# Patient Record
Sex: Male | Born: 1955 | Race: Black or African American | Hispanic: No | Marital: Married | Smoking: Former smoker
Health system: Southern US, Community
[De-identification: ages and names within clinical notes are randomized; demographics above are authoritative.]

## PROBLEM LIST (undated history)

## (undated) DIAGNOSIS — M1990 Unspecified osteoarthritis, unspecified site: Secondary | ICD-10-CM

## (undated) DIAGNOSIS — H6690 Otitis media, unspecified, unspecified ear: Secondary | ICD-10-CM

## (undated) DIAGNOSIS — N400 Enlarged prostate without lower urinary tract symptoms: Secondary | ICD-10-CM

## (undated) DIAGNOSIS — E785 Hyperlipidemia, unspecified: Secondary | ICD-10-CM

## (undated) DIAGNOSIS — I517 Cardiomegaly: Secondary | ICD-10-CM

## (undated) DIAGNOSIS — I10 Essential (primary) hypertension: Secondary | ICD-10-CM

## (undated) DIAGNOSIS — N419 Inflammatory disease of prostate, unspecified: Secondary | ICD-10-CM

## (undated) HISTORY — PX: OTHER SURGICAL HISTORY: SHX169

## (undated) HISTORY — PX: COLONOSCOPY: SHX174

## (undated) HISTORY — DX: Inflammatory disease of prostate, unspecified: N41.9

## (undated) HISTORY — DX: Unspecified osteoarthritis, unspecified site: M19.90

## (undated) HISTORY — DX: Benign prostatic hyperplasia without lower urinary tract symptoms: N40.0

## (undated) HISTORY — PX: CARDIAC CATHETERIZATION: SHX172

## (undated) HISTORY — PX: HEMORRHOID SURGERY: SHX153

## (undated) HISTORY — DX: Cardiomegaly: I51.7

## (undated) HISTORY — PX: UPPER GASTROINTESTINAL ENDOSCOPY: SHX188

## (undated) HISTORY — DX: Essential (primary) hypertension: I10

## (undated) HISTORY — DX: Hyperlipidemia, unspecified: E78.5

---

## 2004-07-03 ENCOUNTER — Ambulatory Visit: Payer: Self-pay | Admitting: Family Medicine

## 2004-07-08 ENCOUNTER — Encounter (HOSPITAL_COMMUNITY): Admission: RE | Admit: 2004-07-08 | Discharge: 2004-08-07 | Payer: Self-pay | Admitting: Family Medicine

## 2005-09-15 ENCOUNTER — Ambulatory Visit: Payer: Self-pay | Admitting: Family Medicine

## 2005-09-15 ENCOUNTER — Encounter (INDEPENDENT_AMBULATORY_CARE_PROVIDER_SITE_OTHER): Payer: Self-pay | Admitting: *Deleted

## 2005-09-25 ENCOUNTER — Encounter (INDEPENDENT_AMBULATORY_CARE_PROVIDER_SITE_OTHER): Payer: Self-pay | Admitting: Family Medicine

## 2005-09-25 LAB — CONVERTED CEMR LAB: PSA: 0.89 ng/mL

## 2005-09-29 ENCOUNTER — Ambulatory Visit: Payer: Self-pay | Admitting: Family Medicine

## 2006-03-08 ENCOUNTER — Ambulatory Visit: Payer: Self-pay | Admitting: Family Medicine

## 2006-03-10 ENCOUNTER — Encounter (INDEPENDENT_AMBULATORY_CARE_PROVIDER_SITE_OTHER): Payer: Self-pay | Admitting: Family Medicine

## 2006-03-10 LAB — CONVERTED CEMR LAB: Blood Glucose, Fasting: 95 mg/dL

## 2006-04-05 ENCOUNTER — Encounter: Payer: Self-pay | Admitting: Family Medicine

## 2006-04-05 DIAGNOSIS — I499 Cardiac arrhythmia, unspecified: Secondary | ICD-10-CM | POA: Insufficient documentation

## 2006-04-05 DIAGNOSIS — E785 Hyperlipidemia, unspecified: Secondary | ICD-10-CM | POA: Insufficient documentation

## 2006-04-05 DIAGNOSIS — I10 Essential (primary) hypertension: Secondary | ICD-10-CM | POA: Insufficient documentation

## 2006-04-05 DIAGNOSIS — M545 Low back pain, unspecified: Secondary | ICD-10-CM | POA: Insufficient documentation

## 2006-06-17 ENCOUNTER — Telehealth (INDEPENDENT_AMBULATORY_CARE_PROVIDER_SITE_OTHER): Payer: Self-pay | Admitting: Family Medicine

## 2006-06-21 ENCOUNTER — Ambulatory Visit: Payer: Self-pay | Admitting: Family Medicine

## 2006-06-21 ENCOUNTER — Telehealth (INDEPENDENT_AMBULATORY_CARE_PROVIDER_SITE_OTHER): Payer: Self-pay | Admitting: Family Medicine

## 2006-06-25 ENCOUNTER — Ambulatory Visit: Payer: Self-pay | Admitting: Family Medicine

## 2006-06-25 ENCOUNTER — Telehealth (INDEPENDENT_AMBULATORY_CARE_PROVIDER_SITE_OTHER): Payer: Self-pay | Admitting: Family Medicine

## 2006-07-20 ENCOUNTER — Encounter (INDEPENDENT_AMBULATORY_CARE_PROVIDER_SITE_OTHER): Payer: Self-pay | Admitting: Family Medicine

## 2006-09-07 ENCOUNTER — Encounter (INDEPENDENT_AMBULATORY_CARE_PROVIDER_SITE_OTHER): Payer: Self-pay | Admitting: Family Medicine

## 2006-10-22 ENCOUNTER — Telehealth (INDEPENDENT_AMBULATORY_CARE_PROVIDER_SITE_OTHER): Payer: Self-pay | Admitting: Family Medicine

## 2006-10-25 ENCOUNTER — Ambulatory Visit (HOSPITAL_COMMUNITY): Admission: RE | Admit: 2006-10-25 | Discharge: 2006-10-25 | Payer: Self-pay | Admitting: Family Medicine

## 2006-10-25 ENCOUNTER — Ambulatory Visit: Payer: Self-pay | Admitting: Family Medicine

## 2006-10-25 DIAGNOSIS — R109 Unspecified abdominal pain: Secondary | ICD-10-CM | POA: Insufficient documentation

## 2006-10-25 LAB — CONVERTED CEMR LAB
Glucose, Urine, Semiquant: NEGATIVE
Ketones, urine, test strip: NEGATIVE
Protein, U semiquant: NEGATIVE

## 2006-10-26 ENCOUNTER — Encounter (INDEPENDENT_AMBULATORY_CARE_PROVIDER_SITE_OTHER): Payer: Self-pay | Admitting: Family Medicine

## 2006-10-27 LAB — CONVERTED CEMR LAB
RBC / HPF: NONE SEEN (ref <3)
WBC, UA: NONE SEEN cells/hpf (ref <3)

## 2006-10-28 ENCOUNTER — Ambulatory Visit (HOSPITAL_COMMUNITY): Admission: RE | Admit: 2006-10-28 | Discharge: 2006-10-28 | Payer: Self-pay | Admitting: Family Medicine

## 2006-11-01 ENCOUNTER — Encounter (INDEPENDENT_AMBULATORY_CARE_PROVIDER_SITE_OTHER): Payer: Self-pay | Admitting: Family Medicine

## 2007-01-31 ENCOUNTER — Encounter (INDEPENDENT_AMBULATORY_CARE_PROVIDER_SITE_OTHER): Payer: Self-pay | Admitting: Family Medicine

## 2007-03-07 ENCOUNTER — Encounter (INDEPENDENT_AMBULATORY_CARE_PROVIDER_SITE_OTHER): Payer: Self-pay | Admitting: Family Medicine

## 2007-09-09 ENCOUNTER — Ambulatory Visit (HOSPITAL_COMMUNITY): Admission: AD | Admit: 2007-09-09 | Discharge: 2007-09-10 | Payer: Self-pay | Admitting: Cardiology

## 2007-09-09 ENCOUNTER — Ambulatory Visit: Payer: Self-pay | Admitting: Family Medicine

## 2007-09-09 ENCOUNTER — Ambulatory Visit: Payer: Self-pay | Admitting: Cardiology

## 2007-09-09 ENCOUNTER — Encounter: Payer: Self-pay | Admitting: Cardiology

## 2007-09-09 DIAGNOSIS — F17200 Nicotine dependence, unspecified, uncomplicated: Secondary | ICD-10-CM | POA: Insufficient documentation

## 2007-09-10 LAB — CONVERTED CEMR LAB
HDL: 55 mg/dL
Triglycerides: 78 mg/dL

## 2007-09-12 ENCOUNTER — Encounter (INDEPENDENT_AMBULATORY_CARE_PROVIDER_SITE_OTHER): Payer: Self-pay | Admitting: Family Medicine

## 2007-09-14 ENCOUNTER — Telehealth (INDEPENDENT_AMBULATORY_CARE_PROVIDER_SITE_OTHER): Payer: Self-pay | Admitting: Family Medicine

## 2007-09-15 ENCOUNTER — Encounter: Payer: Self-pay | Admitting: Cardiology

## 2007-09-15 ENCOUNTER — Encounter (INDEPENDENT_AMBULATORY_CARE_PROVIDER_SITE_OTHER): Payer: Self-pay | Admitting: Family Medicine

## 2007-09-15 ENCOUNTER — Ambulatory Visit: Payer: Self-pay

## 2007-09-26 ENCOUNTER — Telehealth (INDEPENDENT_AMBULATORY_CARE_PROVIDER_SITE_OTHER): Payer: Self-pay | Admitting: *Deleted

## 2007-09-28 ENCOUNTER — Ambulatory Visit: Payer: Self-pay | Admitting: Family Medicine

## 2007-10-25 ENCOUNTER — Ambulatory Visit: Payer: Self-pay | Admitting: Cardiology

## 2007-10-26 IMAGING — US US RENAL
1 series · 14 of 25 positions shown · non-contrast
Comparison: CT 10/25/2006

CLINICAL DATA: Followup CT, evaluate for left renal cyst

RENAL/URINARY TRACT ULTRASOUND
TECHNIQUE: Complete ultrasound examination of the urinary tract was performed
including evaluation of the kidneys, renal collecting systems, and urinary
bladder.

[Series 1: unknown · 0.30mm/px · 14 of 51 slices shown]
[im 1/51]
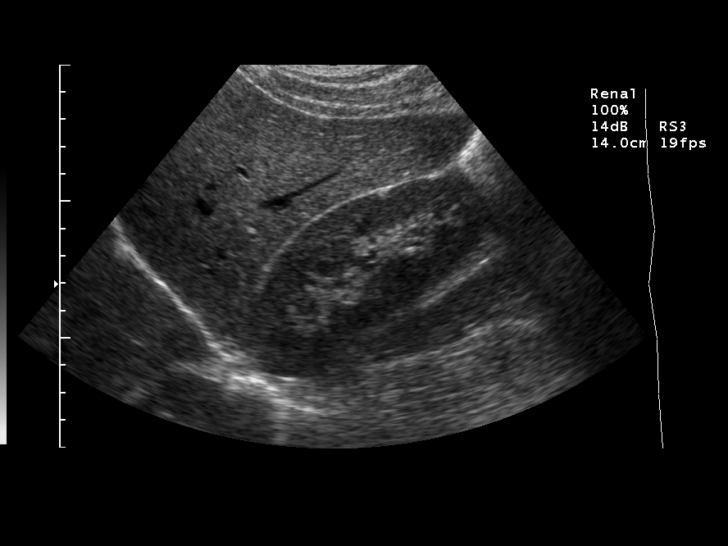
[im 5/51]
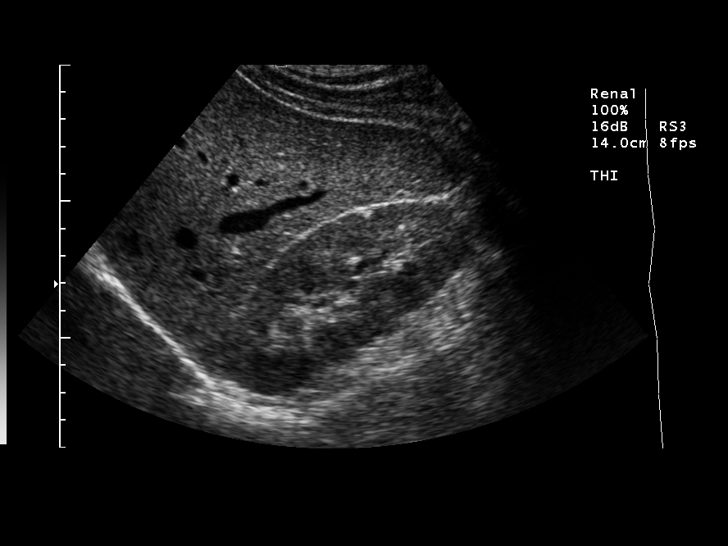
[im 9/51]
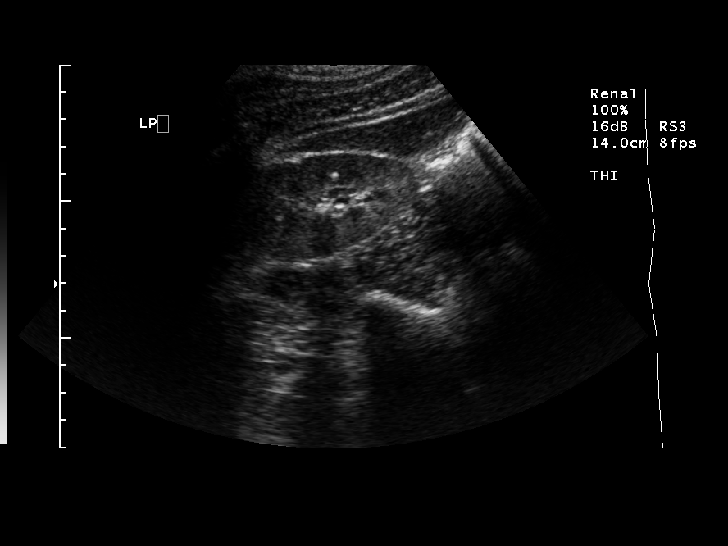
[im 13/51]
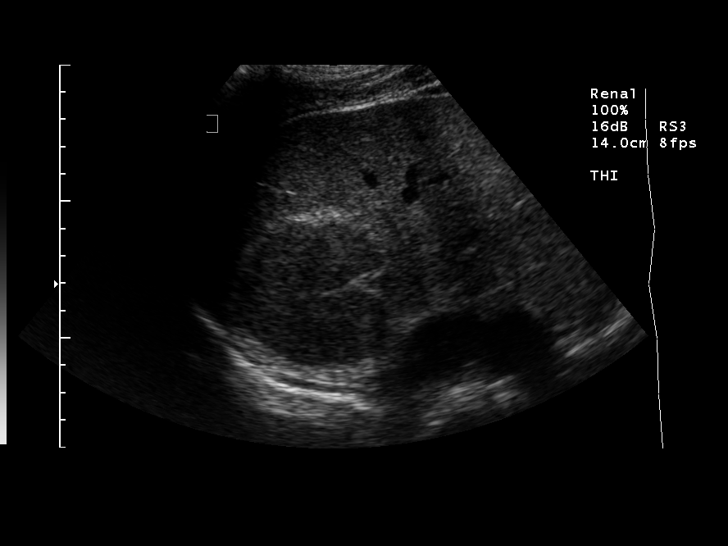
[im 17/51]
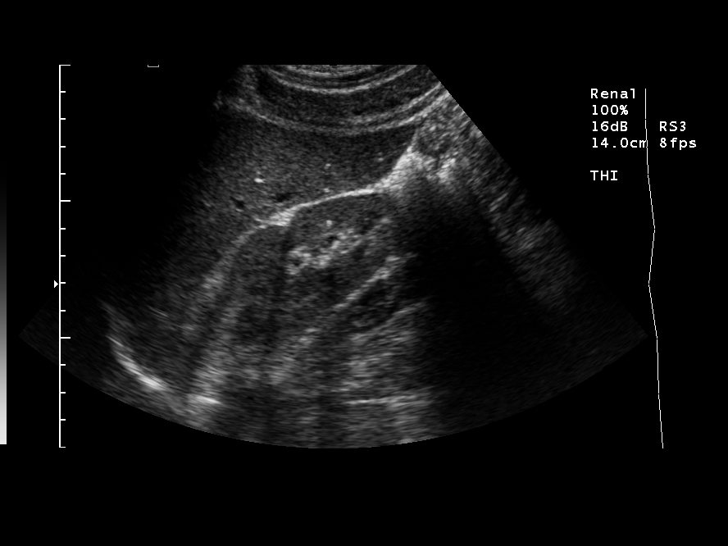
[im 19/51]
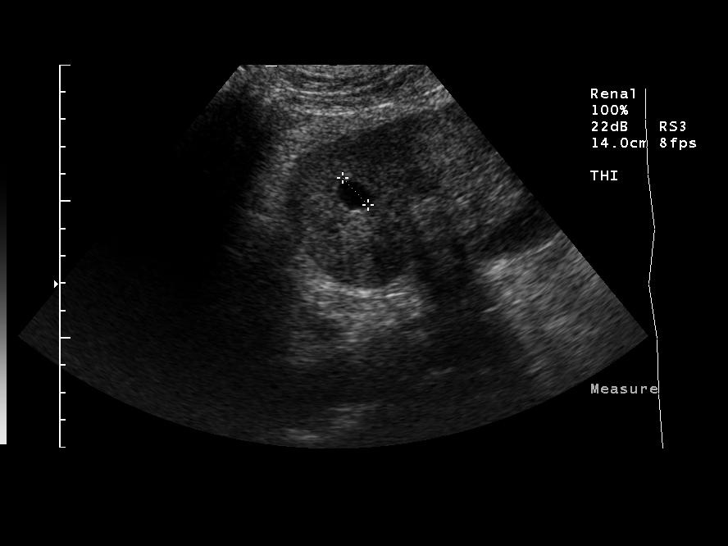
[im 23/51]
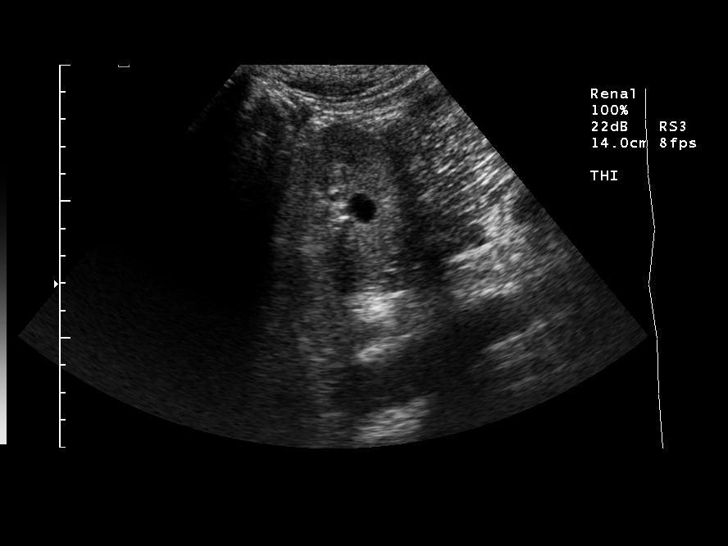
[im 28/51]
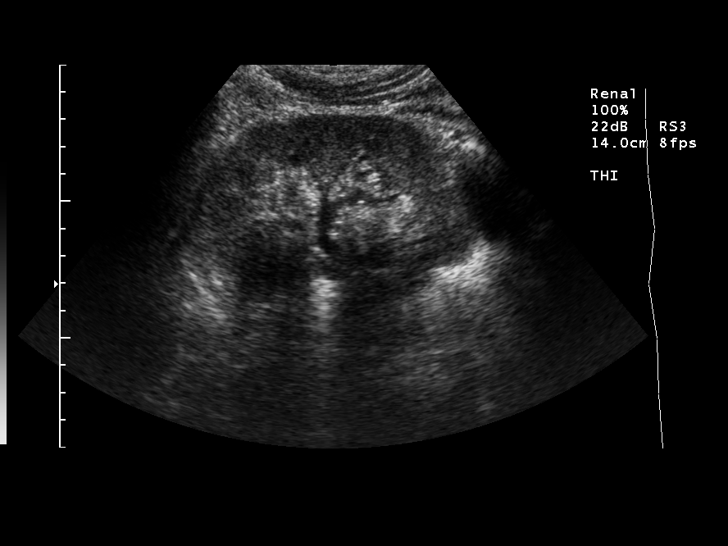
[im 32/51]
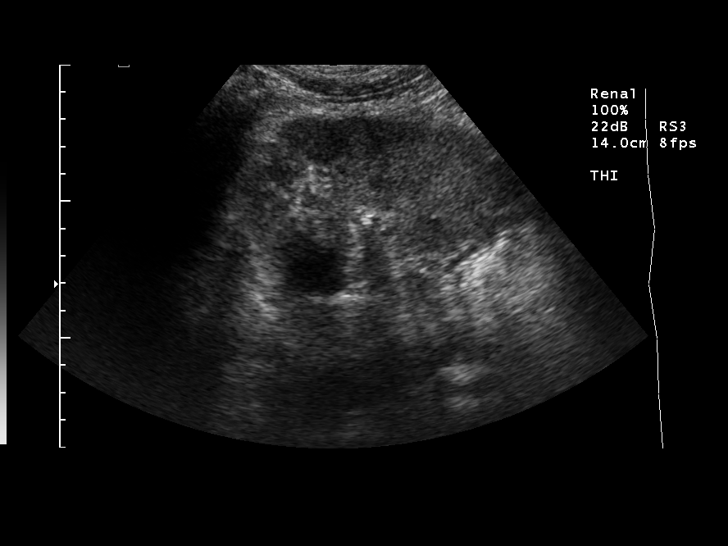
[im 34/51]
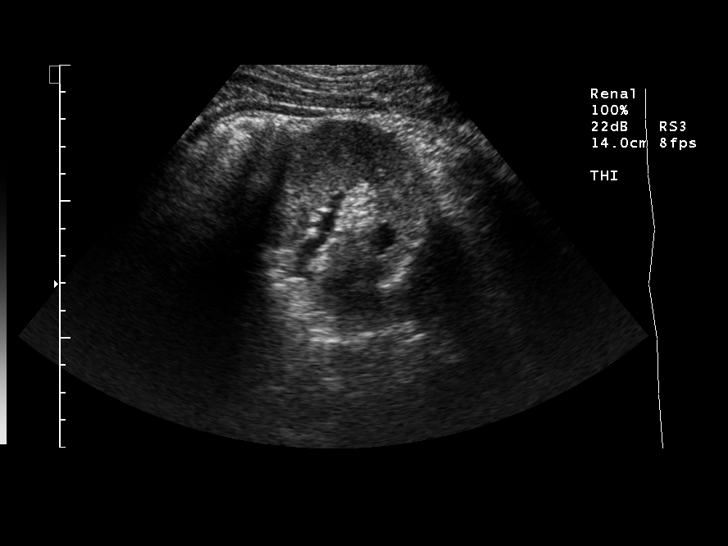
[im 38/51]
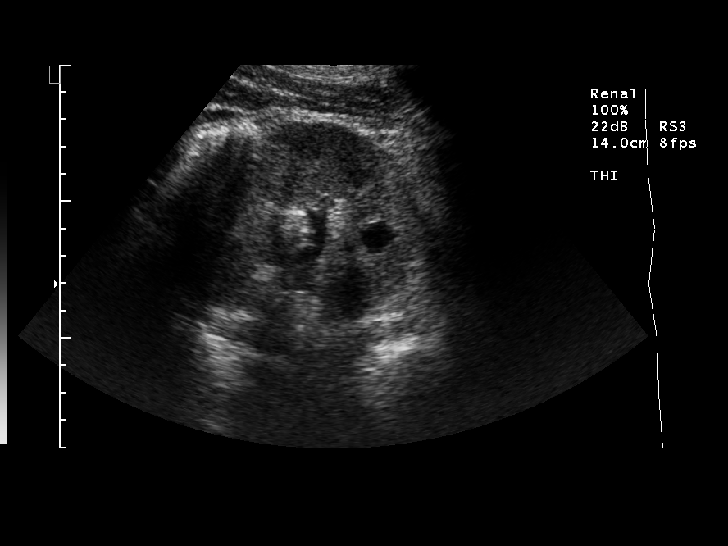
[im 42/51]
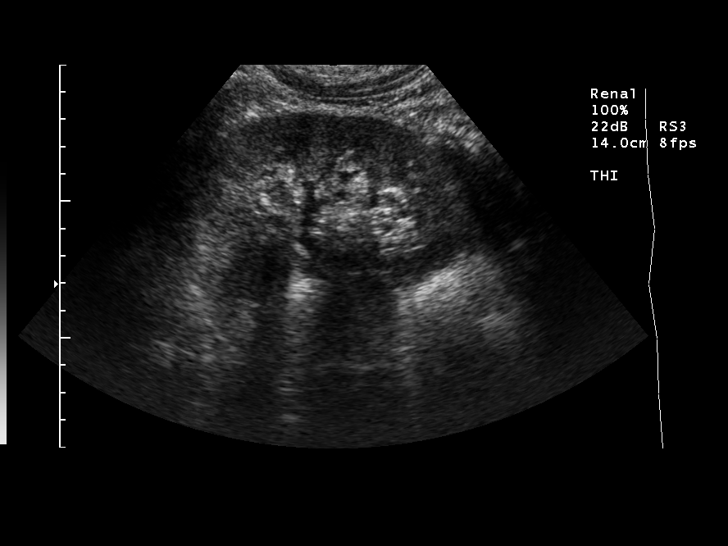
[im 46/51]
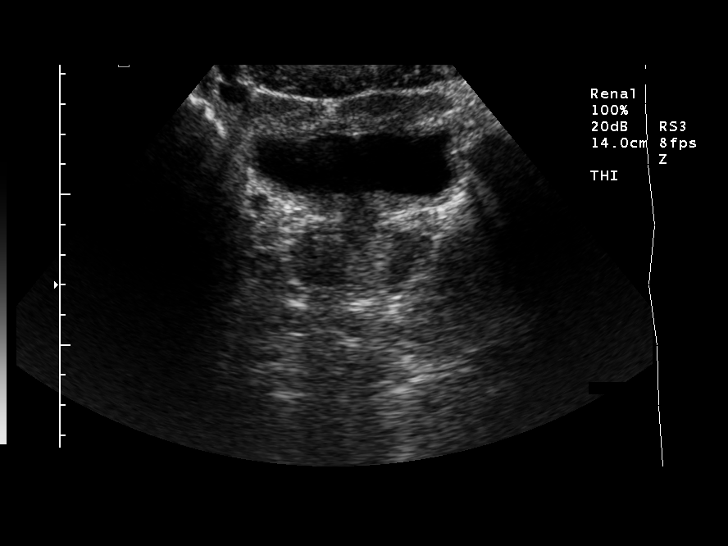
[im 51/51]
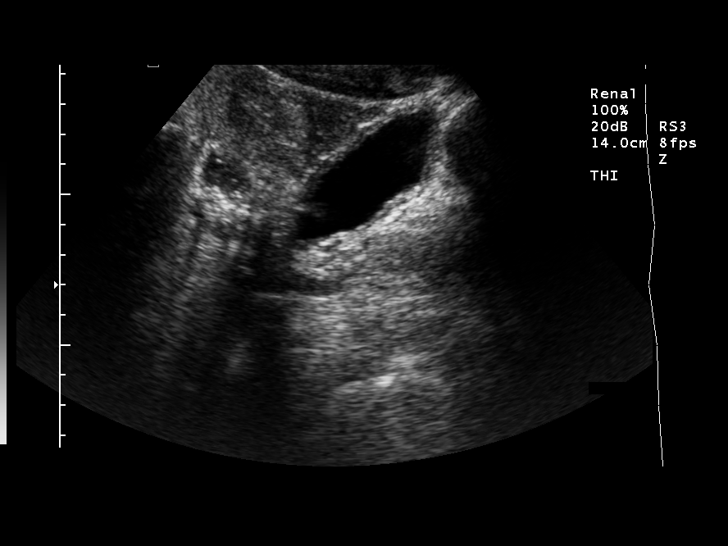

[14 of 25 positions shown; findings below may reference images not displayed]

FINDINGS: Right kidney measures 10.6 cm. No hydronephrosis or mass.

Left kidney measures 10.9 cm. There is a 1.4 x 1.2 x 1.0 cm simple appearing
cyst in the upper pole of the left kidney. No hydronephrosis.

There is a 5 mm hyperechoic area within the collecting system region on the left
with apparent shadowing. This may represent a small stone. However, no stones
were seen on recent CT. 

Bladder is decompressed but otherwise unremarkable.

IMPRESSION

1.4 cm simple appearing cyst in the upper pole of the left kidney.

## 2007-12-30 ENCOUNTER — Ambulatory Visit: Payer: Self-pay | Admitting: Family Medicine

## 2007-12-30 LAB — CONVERTED CEMR LAB
Glucose, Urine, Semiquant: NEGATIVE
Nitrite: NEGATIVE
Specific Gravity, Urine: 1.015
pH: 6.5

## 2007-12-31 ENCOUNTER — Encounter (INDEPENDENT_AMBULATORY_CARE_PROVIDER_SITE_OTHER): Payer: Self-pay | Admitting: Family Medicine

## 2008-01-02 ENCOUNTER — Encounter (INDEPENDENT_AMBULATORY_CARE_PROVIDER_SITE_OTHER): Payer: Self-pay | Admitting: Family Medicine

## 2008-01-02 LAB — CONVERTED CEMR LAB: PSA: 0.78 ng/mL (ref 0.10–4.00)

## 2008-01-06 ENCOUNTER — Ambulatory Visit (HOSPITAL_COMMUNITY): Admission: RE | Admit: 2008-01-06 | Discharge: 2008-01-06 | Payer: Self-pay | Admitting: Family Medicine

## 2008-01-12 ENCOUNTER — Ambulatory Visit: Payer: Self-pay | Admitting: Gastroenterology

## 2008-02-03 ENCOUNTER — Ambulatory Visit (HOSPITAL_COMMUNITY): Admission: RE | Admit: 2008-02-03 | Discharge: 2008-02-03 | Payer: Self-pay | Admitting: Gastroenterology

## 2008-02-03 ENCOUNTER — Ambulatory Visit: Payer: Self-pay | Admitting: Gastroenterology

## 2008-02-07 ENCOUNTER — Ambulatory Visit (HOSPITAL_COMMUNITY): Admission: RE | Admit: 2008-02-07 | Discharge: 2008-02-07 | Payer: Self-pay | Admitting: Gastroenterology

## 2008-06-08 ENCOUNTER — Encounter (INDEPENDENT_AMBULATORY_CARE_PROVIDER_SITE_OTHER): Payer: Self-pay | Admitting: Family Medicine

## 2008-06-08 ENCOUNTER — Ambulatory Visit: Payer: Self-pay | Admitting: Cardiology

## 2008-07-06 ENCOUNTER — Ambulatory Visit: Payer: Self-pay | Admitting: Family Medicine

## 2008-08-31 ENCOUNTER — Ambulatory Visit: Payer: Self-pay | Admitting: Family Medicine

## 2008-09-10 ENCOUNTER — Telehealth (INDEPENDENT_AMBULATORY_CARE_PROVIDER_SITE_OTHER): Payer: Self-pay | Admitting: *Deleted

## 2008-09-10 ENCOUNTER — Encounter (INDEPENDENT_AMBULATORY_CARE_PROVIDER_SITE_OTHER): Payer: Self-pay | Admitting: Family Medicine

## 2008-09-13 ENCOUNTER — Encounter (HOSPITAL_COMMUNITY): Admission: RE | Admit: 2008-09-13 | Discharge: 2008-10-16 | Payer: Self-pay | Admitting: Family Medicine

## 2008-09-19 ENCOUNTER — Ambulatory Visit (HOSPITAL_COMMUNITY): Admission: RE | Admit: 2008-09-19 | Discharge: 2008-09-19 | Payer: Self-pay | Admitting: Family Medicine

## 2008-09-21 ENCOUNTER — Ambulatory Visit: Payer: Self-pay | Admitting: Family Medicine

## 2008-10-10 ENCOUNTER — Telehealth (INDEPENDENT_AMBULATORY_CARE_PROVIDER_SITE_OTHER): Payer: Self-pay | Admitting: *Deleted

## 2008-11-15 ENCOUNTER — Encounter (INDEPENDENT_AMBULATORY_CARE_PROVIDER_SITE_OTHER): Payer: Self-pay | Admitting: Family Medicine

## 2008-11-15 ENCOUNTER — Ambulatory Visit: Admission: RE | Admit: 2008-11-15 | Discharge: 2008-11-15 | Payer: Self-pay | Admitting: Family Medicine

## 2008-11-19 ENCOUNTER — Ambulatory Visit: Payer: Self-pay | Admitting: Family Medicine

## 2008-11-19 DIAGNOSIS — H9190 Unspecified hearing loss, unspecified ear: Secondary | ICD-10-CM | POA: Insufficient documentation

## 2008-12-17 ENCOUNTER — Telehealth (INDEPENDENT_AMBULATORY_CARE_PROVIDER_SITE_OTHER): Payer: Self-pay | Admitting: *Deleted

## 2008-12-19 ENCOUNTER — Telehealth (INDEPENDENT_AMBULATORY_CARE_PROVIDER_SITE_OTHER): Payer: Self-pay | Admitting: *Deleted

## 2008-12-20 ENCOUNTER — Encounter (INDEPENDENT_AMBULATORY_CARE_PROVIDER_SITE_OTHER): Payer: Self-pay | Admitting: Family Medicine

## 2010-06-08 LAB — CONVERTED CEMR LAB
Nitrite: NEGATIVE
Protein, U semiquant: NEGATIVE
Specific Gravity, Urine: 1.025
Urobilinogen, UA: 0.2

## 2010-09-23 NOTE — Consult Note (Signed)
Kyle Rivers, Kyle Rivers               ACCOUNT NO.:  [id number]   MEDICAL RECORD NO.:  [id number]          PATIENT TYPE:  AMB   LOCATION:  DAY                           FACILITY:  [facility]   PHYSICIAN:  Kassie Mends, M.D.      DATE OF BIRTH:  February 09, 1956   DATE OF CONSULTATION:  01/12/2008  DATE OF DISCHARGE:                                 CONSULTATION   REASON FOR CONSULTATION:  Abdominal pain and need for colonoscopy.   HISTORY OF PRESENT ILLNESS:  Kyle Rivers is a 55 year old male who has had  epigastric pain off and on for years.  His symptoms became worse for the  last 4-5 months.  He tried Protonix, and it did not help.  He is now  taking over-the-counter Prilosec, and he feels better.  He describes a  discomfort in the middle of his abdomen with a fullness.  It hurts  especially after eating.  He has not had any weight loss.  It has always  been in the same place and it does not radiate.  He denies any nausea or  vomiting.  He rarely gets constipation.  He has had no diarrhea, blood  in his stool, or black tarry stools.  He does use aspirin on a daily  basis.  He does not use BC powders, Goody Powders, Aleve, or ibuprofen,  Motrin.  He has never had any problems with sedation.  He has a bowel  movement daily.   PAST MEDICAL HISTORY:  1. Hypertension.  2. Hyperlipidemia.   PAST SURGICAL HISTORY:  Hemorrhoidectomy secondary to swollen and  infected hemorrhoids.   ALLERGIES:  No known drug allergies.   MEDICATIONS:  1. Prilosec daily.  2. Aspirin 81 mg daily.  3. Omega-3 daily.  4. Caduet daily.   FAMILY HISTORY:  He denies family history of colon cancer or colon  polyp.   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  He smokes 1  pack every 2 days.  He was a heavy smoker in the past.  He denies any  alcohol use.   REVIEW OF SYSTEMS:  As per the HPI, otherwise all systems are negative.   PHYSICAL EXAMINATION:  GENERAL:  Weight 135 pounds and height 5 feet 7,  temperature 98, blood pressure 140/92, and pulse 72.  GENERAL:  He is no apparent distress.  Alert and oriented x4.  HEENT:  Atraumatic and normocephalic.  Pupils are equal, reactive to  light, posterior pharynx without exudate.  NECK:  Full range of motion.  No lymphadenopathy.  LUNGS:  Clear to auscultation bilaterally.  CARDIOVASCULAR:  Regular rate and rhythm.  Normal S1 and S2.  ABDOMEN:  Bowel sounds are present, soft, nondistended, and mild  tenderness to palpation.  The epigastrium without rebound or guarding.  EXTREMITIES:  No cyanosis or edema.  NEURO:  No new focal neurologic deficits.   ASSESSMENT:  Kyle Rivers is a 55 year old male who has new onset of  dyspepsia.  He also is average risk for developing colon cancer.  The  differential diagnosis for dyspepsia include gastroesophageal  reflux  disease, H. pylori gastritis and a low likelihood of colorectal cancer  with malignancy.  The differential diagnosis also include pancreatic  cancer with a history of smoking. Thank you for allowing me to see Mr.  Rivers in consultation. My recommendations follow.   RECOMMENDATION:  1. Continue Prilosec.  2. He will be scheduled for colonoscopy followed by an EGD with a      Halflytely Bowel Prep.  3. If his endoscopy does not reveal an etiology for epigastric      discomfort, then he will need a CT scan of the abdomen with IV      contrast evaluation of pancreatic cancer.  4. Kyle Rivers will be scheduled a follow up appointment based on the      findings of endoscopy.      Kassie Mends, M.D.  Electronically Signed     SM/MEDQ  D:  01/12/2008  T:  01/13/2008  Job:  [id number]   cc:   Franchot Heidelberg, M.D.

## 2010-09-23 NOTE — Discharge Summary (Signed)
NAMEZAION, HREHA NO.:  [id number]   MEDICAL RECORD NO.:  [id number]          PATIENT TYPE:  OIB   LOCATION:  2920                         FACILITY:  [facility]   PHYSICIAN:  Bevelyn Buckles. Bensimhon, MDDATE OF BIRTH:  [birth year]   DATE OF ADMISSION:  09/09/2007  DATE OF DISCHARGE:  09/10/2007                         DISCHARGE SUMMARY - REFERRING   DISCHARGE DIAGNOSES:  1. Abdominal and chest discomfort of uncertain etiology.  2. No evidence of coronary artery disease on cardiac catheterization.  3. Abnormal electrocardiogram, probable left ventricular hypertrophy      variant.  4. Hypertension.  5. Tobacco use.  6. History of hyperlipidemia.   PROCEDURES PERFORMED:  Cardiac catheterization on Sep 09, 2007, by Dr.  Charlies Constable.   DATE OF ADMISSION:  Sep 09, 2007, Dr. Juanda Chance.   DATE OF DISCHARGE:  Sep 13, 2007, Dr. Gala Romney.   SUMMARY OF HISTORY:  Mr. Flaum is a 55 year old male who approximately 1-  week ago began to experience a constant upper abdominal discomfort that  he attributed to indigestion, worse with meals.  He has also noticed  some discomfort in his chest, but this has not limited his exercise  regimen.  He went to see his primary care physician and his EKG showed  anterior ST-segment elevation, thus he was referred to the hospital and  code STEMI was initiated.   PAST MEDICAL HISTORY:  Notable for hypertension, hyperlipidemia and  tobacco use.   LABORATORY:  Chest x-ray on Sep 09, 2007, showed no acute findings.  Admission hemoglobin and hematocrit was 14.9 and 42.7, normal indices,  platelets 150, WBCs 7.5, PTT 122, PT 14.1.  Sodium 141, potassium 4.4.  BUN 6, creatinine 1.14, glucose 97.  Normal LFTs.  CK-MBs, relative  indexes and troponins were within normal limits x2.  Total cholesterol  was 170, triglycerides 78, HDL 55, LDL 99.  TSH was 2.083.  EKGs here  and in primary care physician showed normal sinus rhythm, normal axis,  LVH,  slight early R-wave progression.  He had early upsloping ST  segments with diffuse slight J point elevation.  However, in leads V1-  V4, he does have ST-segment elevation with biphasic T-waves toward the  lateral leads.   HOSPITAL COURSE:  Mr. Gilman was initiated as a code STEMI and he was  taken emergently to the catheterization lab on Sep 09, 2007, by Dr.  Charlies Constable.  Catheterization site did not show any evidence of  coronary disease.  EF was 60%.  Dr. Juanda Chance felt that his abnormal EKG  was probably secondary to hypertension and LVH, although his EKG is  somewhat atypical.  Enzymes were cycled and he did rule out for a  myocardial infarction.  Dr. Juanda Chance felt that he should be treated for  reflux.  By Sep 10, 2007, the catheterization site was doing well.  Enzymes did rule out myocardial infarction.  Dr. Gala Romney felt that the  patient could be discharged home.  He is recommended to carry a copy of  this abnormal EKG with him.   DISPOSITION:  Mr. Cedrone was discharged home.  Given  permission to return  to work on Sep 13, 2007.  Asked to maintain a low-sodium, heart-healthy  diet.  Wound care and activities per supplemental sheet  postcatheterization.  He received a new prescription for Protonix 40 mg  daily.  Asked to continue his aspirin 81 mg daily, Caduet 10/10 daily  and fish oil daily.  He was asked to begin a blood pressure diary.  Provided an EKG to carry in his wallet.  He was advised no smoking or  tobacco products.  The office will call him with arrangements for an  echocardiogram and follow-up appointment with Dr. Juanda Chance.  He was also  asked to arrange a follow-up appointment with his primary care physician  and to bring all medications, as well as his blood pressure diary to all  appointments.  Discharge time 35 minutes.      Joellyn Rued, PA-C      Bevelyn Buckles. Bensimhon, MD  Electronically Signed    EW/MEDQ  D:  09/10/2007  T:  09/10/2007  Job:  [id number]    cc:   Everardo Beals. Juanda Chance, MD, [facility]  Franchot Heidelberg, M.D.

## 2010-09-23 NOTE — Cardiovascular Report (Signed)
Kyle Rivers, Kyle Rivers               ACCOUNT NO.:  [id number]   MEDICAL RECORD NO.:  [id number]          PATIENT TYPE:  OIB   LOCATION:  2920                         FACILITY:  [facility]   PHYSICIAN:  Bruce R. 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  He has a history of  hypertension and hyperlipidemia, but no known history of heart disease.  He has had chest pain over the past week and saw Dr. Erby Pian today in  his office where his ECG showed anterolateral ST elevation with a  lateral T-wave inversion.  He was transported by ambulance to [facility]  and then by [facility] to [facility] after code STEMI was called.  He  was having minimal pain when he arrived here.   PROCEDURE:  The procedure was performed with a right femoral arterial  sheath and 6-French pyriform coronary catheters.  A front wall arterial  puncture was performed and Omnipaque contrast was used.  The patient  tolerated the procedure well and left the laboratory in satisfactory  condition.   RESULTS:  The aortic pressure was 118/78 with mean of 96 and left  ventricular pressure was 118/14.   The left main coronary artery:  The main coronary artery is free of  significant disease.   The left anterior descending artery, please note that the left  descending artery gave rise to 3 diagonal branches and a large septal  perforator.  The LAD was very large in caliber with mild irregularities  but no significant obstruction.   Circumflex artery:  The circumflex artery was a moderate-sized vessel  gave rise to 2 marginal branches and 2 posterolateral branches.  There  was some irregularities, but no significant obstruction.   The right coronary artery:  The right coronary artery was a moderate-  sized  vessel that gave rise to conus branch, 2 right ventricular  branches, posterior ascending branch, and posterolateral branch.  This  vessel was free of significant disease.   The left ventricle:  The left ventriculogram performed in ROA projection  showed good wall motion with no areas of hypokinesis.  The estimated  ejection fraction was 6%.   CONCLUSION:  Normal coronary angiography and left ventricular wall  motion.   RECOMMENDATIONS:  In view of these findings, I suspect the abnormal ECG  is related to LVH, although the pattern is a little atypical.  We will  plan to cycle enzymes and if these are negative, I think, he can be  discharged tomorrow.  We will plan an outpatient echocardiogram to  evaluate him for LVH or hypertrophic cardiomyopathy.      Bruce Elvera Lennox Juanda Chance, MD, [facility]  Electronically Signed     BRB/MEDQ  D:  09/09/2007  T:  09/10/2007  Job:  [id number]   cc:   Franchot Heidelberg, M.D.

## 2010-09-23 NOTE — Op Note (Signed)
NAMEELAINE, MIDDLETON               ACCOUNT NO.:  [id number]   MEDICAL RECORD NO.:  [id number]          PATIENT TYPE:  AMB   LOCATION:  DAY                           FACILITY:  [facility]   PHYSICIAN:  Kassie Mends, M.D.      DATE OF BIRTH:  Sep 26, 1955   DATE OF PROCEDURE:  DATE OF DISCHARGE:                               OPERATIVE REPORT   REFERRING PHYSICIAN:  Franchot Heidelberg, M.D.   PROCEDURE:  1. Colonoscopy.  2. Esophagogastroduodenoscopy.   INDICATION FOR EXAM:  Mr. Laforte in a 55 year old male who complains of  abdominal pain.  He has a history of tobacco abuse.  The pain is located  in the middle of his abdomen associated with the fullness, especially  hurts after he eats.  It is in the same place and does not radiate. He  is at average risk for developing colon cancer.   FINDINGS:  1. Tortuous colon.  Rare sigmoid colon diverticula and rare sigmoid      colon diverticula.  Otherwise no polyps, masses, inflammatory      changes, or AVMs seen.  2. Patent Schatzki's ring.  Otherwise normal esophagus without      evidence of Barrett's mass or erosion or ulceration.  3. Normal stomach, duodenal bulb, and second portion of the duodenum.   DIAGNOSIS:  No source for Mr. Schuld abdominal pain identified.   RECOMMENDATIONS:  1. He should have a CT scan of the abdomen and pelvis with and without      IV contrast and with oral contrast.  2. He should continue the Prilosec daily.  3. Screening colonoscopy in 10 years.  4. He should follow a high-fiber diet.  He was given handout on high-      fiber diet and diverticulosis.  5. Follow up appointment in 2 months with Dr. Cira Servant regarding his      abdominal pain.   MEDICATIONS:  1. Demerol 75 mg IV.  2. Versed 5 mg IV.   PROCEDURE TECHNIQUE:  Physical exam was formed.  Informed consent was  obtained from the patient after explaining the benefits, risks, and  alternatives of the procedure.  The patient was connected to the monitor  and placed in left lateral position.  Continuous oxygen was provided by  nasal cannula and IV medicine administered through an indwelling  cannula.  After administration of sedation and rectal exam, the  patient's rectum was intubated and the scope was advanced into the  cecum.  The scope was withdrawn by slowly carefully examining the color,  texture, anatomy, and integrity of the mucosa on the way out.   After the colonoscopy, the patient's esophagus was intubated with a  diagnostic gastroscope and the scope was advanced under direct  visualization to the second portion of the duodenum.  The scope was  removed slowly by carefully examine the color, texture, anatomy  integrity mucosa on the way out.  The patient was recovered in endoscopy  and discharged home in satisfactory condition.   ADDENDUM:  CT SCAN 04540 w/ & w/o contrast: small renal cysts. Consider  HBT for  lactose or fructose intolerance.      Kassie Mends, M.D.  Electronically Signed     SM/MEDQ  D:  02/03/2008  T:  02/03/2008  Job:  [id number]   cc:   Franchot Heidelberg, M.D.

## 2010-09-23 NOTE — Assessment & Plan Note (Signed)
[facility]                            CARDIOLOGY OFFICE NOTE   NAME:COLESTanyon, Alipio                      MRN:          [id number]  DATE:10/25/2007                            DOB:          February 18, 1956    PRIMARY CARE PHYSICIAN:  Franchot Heidelberg, M.D.   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  We had an emergency  catheterization, which was normal.  In retrospect, we thought his EKG  changes are probably related to LVH and his symptoms were probably  related to acid reflux.  He was discharged on Protonix and his symptoms  have pretty much resolved.  He is back in to his normal routines.  He  subsequently had an echocardiogram, which showed borderline LV and  septal wall thickness and normal LV function.   PAST MEDICAL HISTORY:  Significant for hypertension, hyperlipidemia, and  tobacco use.   CURRENT MEDICATIONS:  1. Caduet 10/10 daily.  2. Aspirin.  3. Fish oil.  4. Protonix 40 mg daily.   PHYSICAL EXAMINATION:  VITAL SIGNS:  Today, the blood pressure was  132/92 and the pulse 72 and regular.  NECK:  There was no venous distention.  The carotid pulses were full  without bruits.  CHEST:  Clear.  HEART:  Rhythm was regular.  No murmurs or gallops.  ABDOMEN:  Soft without organomegaly.  EXTREMITIES:  Peripheral pulses were full and no peripheral edema.   His electrocardiogram showed LVH and anterolateral ST elevation with  lateral T-wave inversions.   IMPRESSION:  1. Abnormal electrocardiogram, probably related to an unusual variant      of left ventricular hypertrophy.  2. Normal coronary angiography.  3. Hypertension.  4. Hyperlipidemia.  5. Tobacco use.   RECOMMENDATIONS:  Mr. Vitolo is stable and I think his recent symptoms  have pretty much resolved with treatment with Protonix.   He does have  significantly abnormal ECG and he now has a copy to carry with him,  which probably represents an unusual variant of LVH.  I stressed the  importance of continued control of blood pressure and cholesterol and  urged him to continue to work on stopping cigarettes.  We will plan to  turn him over to Dr. Erby Pian and we will plan to see him back here on a  p.r.n. basis.  I told him he could stop the Protonix when he finishes  his current supply.     Bruce Elvera Lennox Juanda Chance, MD, [facility]  Electronically Signed    BRB/MedQ  DD: 10/25/2007  DT: 10/26/2007  Job #: [id number]   cc:   Franchot Heidelberg, M.D.

## 2010-09-23 NOTE — H&P (Signed)
Kyle Rivers, Kyle Rivers               ACCOUNT NO.:  [id number]   MEDICAL RECORD NO.:  [id number]           PATIENT TYPE:   LOCATION:                                 FACILITY:   PHYSICIAN:  Bruce R. Juanda Chance, MD, FACCDATE OF BIRTH:  06-Mar-1956   DATE OF ADMISSION:  09/09/2007  DATE OF DISCHARGE:                              HISTORY & PHYSICAL   PRIMARY CARE Kyle Rivers:  Dr. Erby Pian in [place].   THE PATIENT PROFILE:  A 55 year old married African American male  without prior CAD history, who presents with chest pain and ST-segment  elevation.  Problems:  1. ? ST elevation MI or anterior ST-segment elevation MI.  2. Hypertension.  3. Hyperlipidemia.  4. Ongoing tobacco abuse, 20-30 pack year history, currently smoking      half pack a day.  5. Status post hemorrhoidectomy approximately 6 months ago.   HISTORY OF PRESENT ILLNESS:  A 55 year old married African American male  with history of hypertension, hyperlipidemia, and tobacco abuse.  He is  very active and exercise almost daily.  He was in his usual state of  health.  Approximately 1 week ago, when he began to experience constant  upper abdominal discomfort and indigestion worse with meals and no  change with exertion.  There were no associated symptoms.  Despite this  constant discomfort, he was able to perform his regular exercise routine  on a regular basis without any worsening of symptoms.  Over the past  week, the discomfort moved into his chest, and he became more concerned  and his wife scheduled him an appointment with Dr. Erby Pian.  He saw Dr.  Erby Pian today and an ECG was performed showing anterior ST-segment  elevation.  The patient was transferred to [facility] on April,  16, 2009 and a code STEMI was activated.  The patient arrived at [facility] at 1810, still complaining of a mild chest pain.  ECG, at  [facility] showed 1-mm ST elevation at leads II and aVF, 2-3 mm ST  elevation V1-V6.   ALLERGIES:  Pain tablet cause rash.   HOME MEDICATIONS:  1. Caduet 10/10 mg daily.  2. Aspirin 81 mg daily.  3. Omega-3 acid daily.   FAMILY HISTORY:  Mother is alive at age 29 with a history of CVA.  Father died in motor vehicle accident at age 32, and there was some  questions that when he had MI at that time.  He has 1 brother and 5  sisters, all of them have hypertension.   SOCIAL HISTORY:  He lives in [place] with his wife.  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  He has a 20-30 pack year history tobacco abuse, currently  smoking half-a-pack a day.  He, occasionally, will have a beer, but says  he drinks very little.  Denies any drug use.  He exercises almost on a  very regular basis 15-30 minutes.   REVIEW OF SYSTEMS:  Positive chest pain and indigestion.  Otherwise, all  systems reviewed and negative.   PHYSICAL EXAMINATION:  VITAL SIGNS:  He is afebrile, heart rate 70,  respirations 18, blood pressure 117/77, and pulse ox 97% on 2 liters.  GENERAL:  Pleasant African American male in no acute distress.  Awake,  alert, and oriented x3.  HEENT:  Normal.  Nares grossly intact, nonfocal.  SKIN:  Warm and dry without lesions or masses.  NECK:  No bruits.  No JVD.  LUNGS:  Respirations regular and labored.  CARDIAC:  Regular S1 and S2.  No S3 and S4.  No murmurs.  ABDOMEN:  Round, soft, nontender, and nondistended.  Bowel sounds are  present.  EXTREMITIES:  Warm and dry.  No clubbing, cyanosis, or edema.  Dorsalis  pedis and posterior tibial pulses 2+ and equal bilaterally.   A chest x-ray is pending.  EKG shows sinus rhythm with rate of 65 and  normal axis.  A 1-mm ST elevation II aVF, 2-3 mm ST elevation V1-V6.  Hemoglobin 14.9, hematocrit 42.7, WBC 7.5, platelets 150.  CK-MB less  1.0, troponin-I less than 0.5.   ASSESSMENT AND PLAN:  1. Chest pain.  This was initially called STEMI.  However,       catheterization was performed and this was showed nonobstructive      disease normal left ventricular function.  We will continue      aspirin, statin, and cycle cardiac markers.  Also, check with      lipids and LFTs.  We will add Protonix and will try      gastrointestinal cocktail tonight to see if it helps him any with      his markedly abnormal ECG, or we will need to make a copy of the      patient's ECG, so that he can carry this as well.  2. Hypertension, stable.  Continue calcium channel blocker.  3. Hyperlipidemia.  Check lipids and LFTs, continue Lipitor.  4. Tobacco abuse.  The patient is strongly advised and will obtain a      tobacco cessation consult.      Nicolasa Ducking, ANP      Bruce R. Juanda Chance, MD, [facility]  Electronically Signed    CB/MEDQ  D:  09/09/2007  T:  09/10/2007  Job:  [id number]

## 2011-02-09 LAB — POCT I-STAT, CHEM 8
BUN: 7
Calcium, Ion: 1.1 — ABNORMAL LOW
TCO2: 24

## 2011-05-20 ENCOUNTER — Encounter: Payer: Self-pay | Admitting: Family Medicine

## 2011-05-20 ENCOUNTER — Ambulatory Visit (INDEPENDENT_AMBULATORY_CARE_PROVIDER_SITE_OTHER): Payer: Self-pay | Admitting: Family Medicine

## 2011-05-20 VITALS — BP 126/80 | HR 77 | Resp 16 | Ht 67.0 in | Wt 141.1 lb

## 2011-05-20 DIAGNOSIS — I499 Cardiac arrhythmia, unspecified: Secondary | ICD-10-CM

## 2011-05-20 DIAGNOSIS — E785 Hyperlipidemia, unspecified: Secondary | ICD-10-CM

## 2011-05-20 DIAGNOSIS — M545 Low back pain, unspecified: Secondary | ICD-10-CM

## 2011-05-20 DIAGNOSIS — I10 Essential (primary) hypertension: Secondary | ICD-10-CM

## 2011-05-20 NOTE — Progress Notes (Signed)
  Subjective:    Patient ID: Kyle Rivers, male    DOB: 1955/12/09, 55 y.o.   MRN: [id number]  HPI   Previous PCP Dr. Margo Aye - patient here to establish care  Chronic back pain- use anti-inflammatories when needed, has flares every now and then       Had imaging done at  [facility]- will have results this week  Had flare recently , radicular symptoms down left legt  Labs and UA were negative- no change in bowel or bladder, no parethesia of lower ext   HTN-Diagnosed at age 59 , currently on amlodipine-atorvastatin [facility] Aide in [place])      Seen by cardiology  Hyperlipidemia- currently on low dose lipitor  Abnormal EKG- patient has abnormal EKG noted back to 2009. He had chest pain an EKG showed ST elevation thought to be secondary to MI. He had normal cardiac catheterization. 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   Review of Systems  GEN- denies fatigue, fever, weight loss,weakness, recent illness HEENT- denies eye drainage, change in vision, nasal discharge, CVS- denies chest pain, palpitations RESP- denies SOB, cough, wheeze ABD- denies N/V, change in stools, abd pain GU- denies dysuria, hematuria, dribbling, incontinence MSK- denies joint pain, muscle aches, injury, +back pain Neuro- denies headache, dizziness, syncope, seizure activity      Objective:   Physical Exam GEN- NAD, alert and oriented x3 HEENT- PERRL, EOMI, non injected sclera, pink conjunctiva, MMM, oropharynx clear Neck- Supple, no thryomegaly CVS- RRR, no murmur RESP-CTAB EXT- No edema Back- Neg SLR, neg IR/ER, mild TTP lumbar spine, no paraspinal spasm Neuro- strength equal bilat lower ext, sensation grossly in tact Pulses- Radial, DP- 2+        Assessment & Plan:

## 2011-05-20 NOTE — Patient Instructions (Signed)
Continue your blood pressure medication We will obtain records  Welcome to the practice  Next visit in 3 months

## 2011-05-21 ENCOUNTER — Encounter: Payer: Self-pay | Admitting: Family Medicine

## 2011-05-21 NOTE — Assessment & Plan Note (Signed)
Patient with history of chronic back pain. Since he had a sonogram at [facility] I will obtain these records. He will follow with Dr. that he saw last week. He has no red flags on exam today.

## 2011-05-21 NOTE — Assessment & Plan Note (Signed)
Blood pressure optimally control. No change the medication

## 2011-05-21 NOTE — Assessment & Plan Note (Signed)
Obtain labs her previous PCP. Continue current medication.

## 2011-05-21 NOTE — Assessment & Plan Note (Signed)
I reviewed the cardiology note from [facility]. Patient has an LVH very and which make his EKGs appear like he has ST changes, he has not had any chest pain with his activity

## 2011-06-02 ENCOUNTER — Encounter: Payer: Self-pay | Admitting: Family Medicine

## 2011-08-18 ENCOUNTER — Ambulatory Visit: Payer: BC Managed Care – PPO | Admitting: Family Medicine

## 2011-08-20 ENCOUNTER — Encounter: Payer: Self-pay | Admitting: Family Medicine

## 2011-08-20 ENCOUNTER — Ambulatory Visit (INDEPENDENT_AMBULATORY_CARE_PROVIDER_SITE_OTHER): Payer: BC Managed Care – PPO | Admitting: Family Medicine

## 2011-08-20 VITALS — BP 134/76 | HR 72 | Resp 18 | Ht 67.0 in | Wt 141.0 lb

## 2011-08-20 DIAGNOSIS — M545 Low back pain, unspecified: Secondary | ICD-10-CM

## 2011-08-20 DIAGNOSIS — E785 Hyperlipidemia, unspecified: Secondary | ICD-10-CM

## 2011-08-20 DIAGNOSIS — I10 Essential (primary) hypertension: Secondary | ICD-10-CM

## 2011-08-20 MED ORDER — METOPROLOL SUCCINATE ER 50 MG PO TB24: 50.0000 mg | ORAL_TABLET | Freq: Every day | ORAL | Status: DC

## 2011-08-20 MED ORDER — AMLODIPINE-ATORVASTATIN 10-10 MG PO TABS: 1.0000 | ORAL_TABLET | Freq: Every day | ORAL | Status: DC

## 2011-08-20 NOTE — Assessment & Plan Note (Signed)
Blood pressure at goal. I will send for his records again. He appears to be low risk for coronary artery disease. I do not have these by her followup visit in 6 months he will have fasting lipid panel as well as metabolic panel and urinalysis for protein at that visit

## 2011-08-20 NOTE — Assessment & Plan Note (Signed)
No change to meds, labs in 6 months

## 2011-08-20 NOTE — Assessment & Plan Note (Signed)
Controlled with OTC meds and home exercises

## 2011-08-20 NOTE — Patient Instructions (Signed)
Continue current meds F/U in 6 months 

## 2011-08-20 NOTE — Progress Notes (Signed)
  Subjective:    Patient ID: Kyle Rivers, male    DOB: 24-Feb-1956, 56 y.o.   MRN: [id number]  HPI Patient here for her chronic medical problem followup he has no concerns today tolerating medications. He needs a refill on medications. History reviewed  Back- has been using a back brace that he found on an infomercial which is helping along with his exercises, does not require very much OTC meds   Review of Systems  GEN- denies fatigue, fever, weight loss,weakness, recent illness HEENT- denies eye drainage, change in vision, nasal discharge, CVS- denies chest pain, palpitations RESP- denies SOB, cough, wheeze ABD- denies N/V, change in stools, abd pain GU- denies dysuria, hematuria, dribbling, incontinence MSK- + back pain, muscle aches, injury Neuro- denies headache, dizziness, syncope, seizure activity     Objective:   Physical Exam GEN- NAD, alert and oriented x3 CVS- RRR, no murmur RESP-CTAB EXT- No edema Pulses- Radial, DP- 2+        Assessment & Plan:

## 2012-02-19 ENCOUNTER — Ambulatory Visit: Payer: BC Managed Care – PPO | Admitting: Family Medicine

## 2012-04-05 ENCOUNTER — Encounter: Payer: Self-pay | Admitting: Family Medicine

## 2012-04-05 ENCOUNTER — Ambulatory Visit (INDEPENDENT_AMBULATORY_CARE_PROVIDER_SITE_OTHER): Payer: BC Managed Care – PPO | Admitting: Family Medicine

## 2012-04-05 VITALS — BP 140/76 | HR 94 | Resp 18 | Ht 67.0 in | Wt 142.1 lb

## 2012-04-05 DIAGNOSIS — R809 Proteinuria, unspecified: Secondary | ICD-10-CM

## 2012-04-05 DIAGNOSIS — E785 Hyperlipidemia, unspecified: Secondary | ICD-10-CM

## 2012-04-05 DIAGNOSIS — I10 Essential (primary) hypertension: Secondary | ICD-10-CM

## 2012-04-05 LAB — POCT URINALYSIS DIPSTICK
Bilirubin, UA: NEGATIVE
Blood, UA: NEGATIVE
Glucose, UA: NEGATIVE
Ketones, UA: NEGATIVE
Nitrite, UA: NEGATIVE
Spec Grav, UA: 1.02
pH, UA: 7.5

## 2012-04-05 MED ORDER — AMLODIPINE-ATORVASTATIN 10-10 MG PO TABS: 1.0000 | ORAL_TABLET | Freq: Every day | ORAL | Status: DC

## 2012-04-05 MED ORDER — METOPROLOL SUCCINATE ER 50 MG PO TB24: 50.0000 mg | ORAL_TABLET | Freq: Every day | ORAL | Status: DC

## 2012-04-05 NOTE — Patient Instructions (Signed)
Continue current medications Fasting labs in the morning F/U 6 months

## 2012-04-06 ENCOUNTER — Encounter: Payer: Self-pay | Admitting: Family Medicine

## 2012-04-06 LAB — CBC
HCT: 43.4 % (ref 39.0–52.0)
Hemoglobin: 15.1 g/dL (ref 13.0–17.0)
MCH: 32 pg (ref 26.0–34.0)
MCHC: 34.8 g/dL (ref 30.0–36.0)
MCV: 91.9 fL (ref 78.0–100.0)
Platelets: 172 10*3/uL (ref 150–400)
RBC: 4.72 MIL/uL (ref 4.22–5.81)
RDW: 14.2 % (ref 11.5–15.5)

## 2012-04-06 NOTE — Assessment & Plan Note (Addendum)
Well controlled continue current medications, fasting labs to be done Check proteinuria

## 2012-04-06 NOTE — Progress Notes (Signed)
  Subjective:    Patient ID: Kyle Rivers, male    DOB: Jun 13, 1955, 56 y.o.   MRN: [id number]  HPI Pt here to f/u HTN and hyperlipidemia. No specific concerns  No difficulty with meds  Declines flu shot Occasional back pain declines medications uses exercise   Review of Systems  GEN- denies fatigue, fever, weight loss,weakness, recent illness HEENT- denies eye drainage, change in vision, nasal discharge, CVS- denies chest pain, palpitations RESP- denies SOB, cough, wheeze ABD- denies N/V, change in stools, abd pain GU- denies dysuria, hematuria, dribbling, incontinence MSK- +joint pain, muscle aches, injury Neuro- denies headache, dizziness, syncope, seizure activity      Objective:   Physical Exam GEN- NAD, alert and oriented x3 HEENT- PERRL, EOMI, non injected sclera, pink conjunctiva, MMM, oropharynx clear CVS- RRR, no murmur RESP-CTAB EXT- No edema Pulses- Radial, DP- 2+        Assessment & Plan:

## 2012-04-06 NOTE — Assessment & Plan Note (Signed)
Check FLP on atorvastatin

## 2012-04-07 LAB — COMPREHENSIVE METABOLIC PANEL
Alkaline Phosphatase: 76 U/L (ref 39–117)
BUN: 8 mg/dL (ref 6–23)
Creat: 1.04 mg/dL (ref 0.50–1.35)
Glucose, Bld: 97 mg/dL (ref 70–99)
Sodium: 141 mEq/L (ref 135–145)
Total Bilirubin: 0.5 mg/dL (ref 0.3–1.2)
Total Protein: 7.7 g/dL (ref 6.0–8.3)

## 2012-04-07 LAB — LIPID PANEL
Cholesterol: 174 mg/dL (ref 0–200)
HDL: 59 mg/dL (ref >39)
Triglycerides: 71 mg/dL (ref <150)

## 2012-11-25 ENCOUNTER — Other Ambulatory Visit: Payer: Self-pay | Admitting: Family Medicine

## 2012-11-28 ENCOUNTER — Ambulatory Visit (INDEPENDENT_AMBULATORY_CARE_PROVIDER_SITE_OTHER): Payer: BC Managed Care – PPO | Admitting: Family Medicine

## 2012-11-28 VITALS — BP 134/80 | HR 68 | Temp 99.5°F | Resp 18 | Wt 145.0 lb

## 2012-11-28 DIAGNOSIS — E785 Hyperlipidemia, unspecified: Secondary | ICD-10-CM

## 2012-11-28 DIAGNOSIS — I10 Essential (primary) hypertension: Secondary | ICD-10-CM

## 2012-11-28 DIAGNOSIS — N529 Male erectile dysfunction, unspecified: Secondary | ICD-10-CM

## 2012-11-28 MED ORDER — AMLODIPINE-ATORVASTATIN 10-10 MG PO TABS: 1.0000 | ORAL_TABLET | Freq: Every day | ORAL | Status: DC

## 2012-11-28 MED ORDER — METOPROLOL SUCCINATE ER 50 MG PO TB24: 50.0000 mg | ORAL_TABLET | Freq: Every day | ORAL | Status: DC

## 2012-11-28 NOTE — Patient Instructions (Signed)
Continue current medications  I will check testosterone levels  Get labs done fasting  F/U November for physical

## 2012-11-29 ENCOUNTER — Encounter: Payer: Self-pay | Admitting: Family Medicine

## 2012-11-29 DIAGNOSIS — N529 Male erectile dysfunction, unspecified: Secondary | ICD-10-CM | POA: Insufficient documentation

## 2012-11-29 NOTE — Assessment & Plan Note (Signed)
This is likely due to age. We will go ahead and check his testosterone level. He was given a coupon card to get 3 tablets of Viagra for free.

## 2012-11-29 NOTE — Progress Notes (Signed)
  Subjective:    Patient ID: Kyle Rivers, male    DOB: 05/04/1956, 57 y.o.   MRN: [id number]  HPI  Patient here to follow chronic medical problems. He is tolerating his medications without any concerns. Does complain of erectile dysfunction which he has had for the past 6 months or so. He has difficulty obtaining his erection. He denies any premature ejaculation. Occasionally he has difficulty with his libido but still has an interest in sexual activity. He is sexually active with his wife.   Review of Systems  GEN- denies fatigue, fever, weight loss,weakness, recent illness HEENT- denies eye drainage, change in vision, nasal discharge, CVS- denies chest pain, palpitations RESP- denies SOB, cough, wheeze ABD- denies N/V, change in stools, abd pain GU- denies dysuria, hematuria, dribbling, incontinence MSK- denies joint pain, muscle aches, injury Neuro- denies headache, dizziness, syncope, seizure activity      Objective:   Physical Exam GEN- NAD, alert and oriented x3 HEENT- PERRL, EOMI, non injected sclera, pink conjunctiva, MMM, oropharynx clear CVS- RRR, no murmur RESP-CTAB ABD-NABS,soft,NT,ND EXT- No edema Pulses- Radial, DP- 2+        Assessment & Plan:

## 2012-11-29 NOTE — Assessment & Plan Note (Signed)
Continue Lipitor. His labs will be checked prior to his physical examination. Last LDL was at goal

## 2012-11-29 NOTE — Assessment & Plan Note (Signed)
Blood blood pressure is well-controlled. It is possible that the metoprolol may be contributing to some erectile dysfunction however at this time we will continue the medication

## 2013-06-12 ENCOUNTER — Ambulatory Visit (INDEPENDENT_AMBULATORY_CARE_PROVIDER_SITE_OTHER): Payer: BC Managed Care – PPO | Admitting: Family Medicine

## 2013-06-12 ENCOUNTER — Encounter: Payer: Self-pay | Admitting: Family Medicine

## 2013-06-12 VITALS — BP 136/90 | HR 80 | Temp 98.8°F | Resp 18 | Ht 65.0 in | Wt 138.0 lb

## 2013-06-12 DIAGNOSIS — N3090 Cystitis, unspecified without hematuria: Secondary | ICD-10-CM

## 2013-06-12 DIAGNOSIS — I10 Essential (primary) hypertension: Secondary | ICD-10-CM

## 2013-06-12 DIAGNOSIS — N419 Inflammatory disease of prostate, unspecified: Secondary | ICD-10-CM

## 2013-06-12 LAB — URINALYSIS, ROUTINE W REFLEX MICROSCOPIC
Bilirubin Urine: NEGATIVE
Glucose, UA: NEGATIVE mg/dL
Ketones, ur: NEGATIVE mg/dL
LEUKOCYTES UA: NEGATIVE
NITRITE: NEGATIVE
PROTEIN: 30 mg/dL — AB
Specific Gravity, Urine: 1.025 (ref 1.005–1.030)
Urobilinogen, UA: 0.2 mg/dL (ref 0.0–1.0)
pH: 5.5 (ref 5.0–8.0)

## 2013-06-12 LAB — URINALYSIS, MICROSCOPIC ONLY: Crystals: NONE SEEN

## 2013-06-12 MED ORDER — CIPROFLOXACIN HCL 500 MG PO TABS: 500.0000 mg | ORAL_TABLET | Freq: Two times a day (BID) | ORAL | Status: DC

## 2013-06-12 NOTE — Patient Instructions (Signed)
Start antibiotics Drink plenty of water Return in 6 weeks for physical- Come fasting labs to be done

## 2013-06-13 DIAGNOSIS — N419 Inflammatory disease of prostate, unspecified: Secondary | ICD-10-CM | POA: Insufficient documentation

## 2013-06-13 NOTE — Progress Notes (Signed)
   Subjective:    Patient ID: Kyle Rivers, male    DOB: [birth year], 58 y.o.   MRN: [id number]  HPI  Pt here with urinary pressure/ discomfort for the past week. Has urgency and frequency but most of time dribbles or does not completely empty bladder. Denies penile discharge, married 1 sexual partner. Denies hematuria, abd pain, N/V, Fever.  HTN- tolerating medications as prescribed, no concerns   Review of Systems  GEN- denies fatigue, fever, weight loss,weakness, recent illness HEENT- denies eye drainage, change in vision, nasal discharge, CVS- denies chest pain, palpitations RESP- denies SOB, cough, wheeze ABD- denies N/V, change in stools, abd pain GU- +dysuria, hematuria, +dribbling, incontinence       Objective:   Physical Exam GEN- NAD, alert and oriented x3 ABD-NABS,soft,NT,ND, no CVA tenderness GU- Normal rectal tone, prostate enlarged, mild bogginess, TTP, no occult blood  EXT- No edema Pulses- Radial, DP- 2+        Assessment & Plan:

## 2013-06-13 NOTE — Assessment & Plan Note (Signed)
UA does not show any leukocytosis, mild hematuria which he has had worked up in the past by urology Will treat for likley prostatitis, start Cipro BID x 10 days Recheck at f/u visit

## 2013-06-13 NOTE — Assessment & Plan Note (Signed)
Blood pressure looks okay, continue current meds

## 2013-06-22 ENCOUNTER — Telehealth: Payer: Self-pay | Admitting: Family Medicine

## 2013-06-22 NOTE — Telephone Encounter (Signed)
Called pt he is wanting to be seen sometime either tomorrow or next week before Thurs.THere are no openings for tomorrow except for same day as well as all next week.Marland Kitchenare you ok will a same day slot for this pt tomorrow

## 2013-06-22 NOTE — Telephone Encounter (Signed)
Pt is still having pain lower stomach and now in his testicle area (soreness), same pressure feeling in stomach when he was here a week ago, the antiboitics helped with going to bathroom and urine flow is fine. What should he do now? Call back number is [phone]

## 2013-06-23 ENCOUNTER — Encounter: Payer: Self-pay | Admitting: Family Medicine

## 2013-06-23 ENCOUNTER — Ambulatory Visit (INDEPENDENT_AMBULATORY_CARE_PROVIDER_SITE_OTHER): Payer: BC Managed Care – PPO | Admitting: Family Medicine

## 2013-06-23 VITALS — BP 130/80 | HR 82 | Temp 98.5°F | Resp 18 | Ht 65.0 in | Wt 142.0 lb

## 2013-06-23 DIAGNOSIS — N5082 Scrotal pain: Secondary | ICD-10-CM

## 2013-06-23 DIAGNOSIS — R109 Unspecified abdominal pain: Secondary | ICD-10-CM

## 2013-06-23 DIAGNOSIS — N509 Disorder of male genital organs, unspecified: Secondary | ICD-10-CM

## 2013-06-23 LAB — URINALYSIS, ROUTINE W REFLEX MICROSCOPIC
Bilirubin Urine: NEGATIVE
Glucose, UA: NEGATIVE mg/dL
LEUKOCYTES UA: NEGATIVE
NITRITE: NEGATIVE
Protein, ur: 30 mg/dL — AB
SPECIFIC GRAVITY, URINE: 1.02 (ref 1.005–1.030)
UROBILINOGEN UA: 0.2 mg/dL (ref 0.0–1.0)
pH: 5.5 (ref 5.0–8.0)

## 2013-06-23 LAB — URINALYSIS, MICROSCOPIC ONLY
CRYSTALS: NONE SEEN
Casts: NONE SEEN

## 2013-06-23 NOTE — Patient Instructions (Addendum)
CT scan to be done We will cal with results

## 2013-06-23 NOTE — Telephone Encounter (Signed)
Please put him in the schedule

## 2013-06-23 NOTE — Telephone Encounter (Signed)
appt at 200pm today per dr. Buelah Manis

## 2013-06-24 LAB — CBC WITH DIFFERENTIAL/PLATELET
BASOS ABS: 0 10*3/uL (ref 0.0–0.1)
Basophils Relative: 1 % (ref 0–1)
EOS PCT: 2 % (ref 0–5)
Eosinophils Absolute: 0.1 10*3/uL (ref 0.0–0.7)
HCT: 48.2 % (ref 39.0–52.0)
Hemoglobin: 17 g/dL (ref 13.0–17.0)
LYMPHS PCT: 28 % (ref 12–46)
Lymphs Abs: 2 10*3/uL (ref 0.7–4.0)
MCH: 32.1 pg (ref 26.0–34.0)
MCHC: 35.3 g/dL (ref 30.0–36.0)
MCV: 90.9 fL (ref 78.0–100.0)
Monocytes Absolute: 0.6 10*3/uL (ref 0.1–1.0)
Monocytes Relative: 8 % (ref 3–12)
NEUTROS ABS: 4.3 10*3/uL (ref 1.7–7.7)
NEUTROS PCT: 61 % (ref 43–77)
PLATELETS: 198 10*3/uL (ref 150–400)
RBC: 5.3 MIL/uL (ref 4.22–5.81)
RDW: 14.4 % (ref 11.5–15.5)
WBC: 7 10*3/uL (ref 4.0–10.5)

## 2013-06-24 LAB — COMPREHENSIVE METABOLIC PANEL
ALBUMIN: 4.7 g/dL (ref 3.5–5.2)
ALT: 27 U/L (ref 0–53)
AST: 27 U/L (ref 0–37)
Alkaline Phosphatase: 81 U/L (ref 39–117)
BUN: 12 mg/dL (ref 6–23)
CALCIUM: 10.2 mg/dL (ref 8.4–10.5)
CHLORIDE: 98 meq/L (ref 96–112)
CO2: 27 meq/L (ref 19–32)
Creat: 1.03 mg/dL (ref 0.50–1.35)
GLUCOSE: 99 mg/dL (ref 70–99)
POTASSIUM: 3.9 meq/L (ref 3.5–5.3)
SODIUM: 139 meq/L (ref 135–145)
TOTAL PROTEIN: 8 g/dL (ref 6.0–8.3)
Total Bilirubin: 0.5 mg/dL (ref 0.2–1.2)

## 2013-06-25 DIAGNOSIS — R109 Unspecified abdominal pain: Secondary | ICD-10-CM | POA: Insufficient documentation

## 2013-06-25 DIAGNOSIS — N5082 Scrotal pain: Secondary | ICD-10-CM | POA: Insufficient documentation

## 2013-06-25 LAB — URINE CULTURE
COLONY COUNT: NO GROWTH
ORGANISM ID, BACTERIA: NO GROWTH

## 2013-06-25 NOTE — Assessment & Plan Note (Signed)
I will obtain a CT of abdomen pelvis for ongoing symptoms despite treatment for prostatitis above. He's had a few episodes of diarrhea but this does not truly consistent with a diverticulitis or colitis. I will obtain CBC and metabolic panel today his urinalysis was also repeated but unremarkable it was sent for culture

## 2013-06-25 NOTE — Assessment & Plan Note (Signed)
He describes this as more of a strange sensation and pain I do not see any swelling or signs of epididymitis he she's completed antibiotics. We may need urology if the CT scan does not show anything

## 2013-06-25 NOTE — Progress Notes (Signed)
Patient ID: Kyle Rivers, male   DOB: [birth year], 58 y.o.   MRN: [id number]   Subjective:    Patient ID: Kyle Rivers, male    DOB: 24-Mar-1956, 58 y.o.   MRN: [id number]  Patient presents for Abdominal Pain and Testicle Pain  patient presents with continued abdominal pain and abnormal sensation in the scrotal region. He was seen about 2 weeks ago at that time treated for possible prostatitis as he was having difficulty urinating and pressure and pain with urination. His urinary symptoms have cleared up completely however he still has lower abdominal pain on and off. He has been drinking an herbal anti-inflammatory T. which has helped. He does note that he has had a few episodes of diarrhea but nothing is consistent no blood in the stools. He also has a strange sensation of testicular region although he has not had any swelling or redness or true pain.    Review Of Systems:  GEN- denies fatigue, fever, weight loss,weakness, recent illness HEENT- denies eye drainage, change in vision, nasal discharge, CVS- denies chest pain, palpitations RESP- denies SOB, cough, wheeze ABD- denies N/V, change in stools, +abd pain GU- denies dysuria, hematuria, dribbling, incontinence Neuro- denies headache, dizziness, syncope, seizure activity       Objective:    BP 130/80  Pulse 82  Temp(Src) 98.5 F (36.9 C) (Oral)  Resp 18  Ht 5\' 5"  (1.651 m)  Wt 142 lb (64.411 kg)  BMI 23.63 kg/m2 GEN- NAD, alert and oriented x3 ABD-NABS,soft,MILD TTP in lower quadrants, no rebound,, no mass felt GU- no penile lesions, no scrotal swelling or redness, epididymis NT EXT- No edema Pulses- Radial 2+        Assessment & Plan:      Problem List Items Addressed This Visit   None    Visit Diagnoses   Abdominal pain, unspecified site    -  Primary    Relevant Orders       Comprehensive metabolic panel (Completed)       CBC with Differential (Completed)       CT Abdomen Pelvis W Contrast    Urinalysis, Routine w reflex microscopic (Completed)       Urine culture (Completed)       Note: This dictation was prepared with Dragon dictation along with smaller phrase technology. Any transcriptional errors that result from this process are unintentional.

## 2013-06-29 ENCOUNTER — Ambulatory Visit (HOSPITAL_COMMUNITY)
Admission: RE | Admit: 2013-06-29 | Discharge: 2013-06-29 | Disposition: A | Discharge status: Home / Self Care | Payer: BC Managed Care – PPO | Source: Ambulatory Visit | Attending: Family Medicine | Admitting: Family Medicine

## 2013-06-29 DIAGNOSIS — R109 Unspecified abdominal pain: Secondary | ICD-10-CM

## 2013-06-29 DIAGNOSIS — Q619 Cystic kidney disease, unspecified: Secondary | ICD-10-CM | POA: Insufficient documentation

## 2013-06-29 DIAGNOSIS — I10 Essential (primary) hypertension: Secondary | ICD-10-CM | POA: Insufficient documentation

## 2013-06-29 DIAGNOSIS — I7090 Unspecified atherosclerosis: Secondary | ICD-10-CM | POA: Insufficient documentation

## 2013-06-29 DIAGNOSIS — I723 Aneurysm of iliac artery: Secondary | ICD-10-CM | POA: Insufficient documentation

## 2013-06-29 DIAGNOSIS — I517 Cardiomegaly: Secondary | ICD-10-CM | POA: Insufficient documentation

## 2013-06-29 DIAGNOSIS — I82890 Acute embolism and thrombosis of other specified veins: Secondary | ICD-10-CM | POA: Insufficient documentation

## 2013-06-29 DIAGNOSIS — E785 Hyperlipidemia, unspecified: Secondary | ICD-10-CM | POA: Insufficient documentation

## 2013-06-29 MED ORDER — IOHEXOL 300 MG/ML  SOLN
100.0000 mL | Freq: Once | INTRAMUSCULAR | Status: AC | PRN
  Administered 2013-06-29: 100 mL via INTRAVENOUS

## 2013-06-30 ENCOUNTER — Other Ambulatory Visit: Payer: Self-pay | Admitting: Family Medicine

## 2013-06-30 DIAGNOSIS — E785 Hyperlipidemia, unspecified: Secondary | ICD-10-CM

## 2013-06-30 MED ORDER — TAMSULOSIN HCL 0.4 MG PO CAPS: 0.4000 mg | ORAL_CAPSULE | Freq: Every day | ORAL | Status: DC

## 2013-07-25 ENCOUNTER — Ambulatory Visit: Payer: BC Managed Care – PPO | Admitting: Family Medicine

## 2013-07-31 ENCOUNTER — Ambulatory Visit (INDEPENDENT_AMBULATORY_CARE_PROVIDER_SITE_OTHER): Payer: BC Managed Care – PPO | Admitting: Family Medicine

## 2013-07-31 ENCOUNTER — Encounter: Payer: Self-pay | Admitting: Family Medicine

## 2013-07-31 VITALS — BP 138/82 | HR 86 | Temp 98.4°F | Resp 14 | Ht 65.5 in | Wt 145.0 lb

## 2013-07-31 DIAGNOSIS — E785 Hyperlipidemia, unspecified: Secondary | ICD-10-CM

## 2013-07-31 DIAGNOSIS — R109 Unspecified abdominal pain: Secondary | ICD-10-CM

## 2013-07-31 DIAGNOSIS — N400 Enlarged prostate without lower urinary tract symptoms: Secondary | ICD-10-CM

## 2013-07-31 DIAGNOSIS — R102 Pelvic and perineal pain: Secondary | ICD-10-CM

## 2013-07-31 DIAGNOSIS — I10 Essential (primary) hypertension: Secondary | ICD-10-CM

## 2013-07-31 LAB — COMPREHENSIVE METABOLIC PANEL
ALK PHOS: 76 U/L (ref 39–117)
ALT: 13 U/L (ref 0–53)
AST: 17 U/L (ref 0–37)
Albumin: 4.4 g/dL (ref 3.5–5.2)
BUN: 9 mg/dL (ref 6–23)
CO2: 29 mEq/L (ref 19–32)
Calcium: 9.8 mg/dL (ref 8.4–10.5)
Chloride: 100 mEq/L (ref 96–112)
Creat: 1.21 mg/dL (ref 0.50–1.35)
Glucose, Bld: 100 mg/dL — ABNORMAL HIGH (ref 70–99)
POTASSIUM: 4.1 meq/L (ref 3.5–5.3)
Sodium: 141 mEq/L (ref 135–145)
Total Bilirubin: 0.4 mg/dL (ref 0.2–1.2)
Total Protein: 8 g/dL (ref 6.0–8.3)

## 2013-07-31 LAB — LIPID PANEL
CHOL/HDL RATIO: 2.9 ratio
Cholesterol: 196 mg/dL (ref 0–200)
HDL: 68 mg/dL (ref >39)
LDL Cholesterol: 104 mg/dL — ABNORMAL HIGH (ref 0–99)
Triglycerides: 122 mg/dL (ref <150)
VLDL: 24 mg/dL (ref 0–40)

## 2013-07-31 LAB — CBC WITH DIFFERENTIAL/PLATELET
BASOS PCT: 1 % (ref 0–1)
Basophils Absolute: 0.1 10*3/uL (ref 0.0–0.1)
Eosinophils Absolute: 0.1 10*3/uL (ref 0.0–0.7)
Eosinophils Relative: 2 % (ref 0–5)
HEMATOCRIT: 44.9 % (ref 39.0–52.0)
Hemoglobin: 15.5 g/dL (ref 13.0–17.0)
Lymphocytes Relative: 26 % (ref 12–46)
Lymphs Abs: 1.7 10*3/uL (ref 0.7–4.0)
MCH: 32.1 pg (ref 26.0–34.0)
MCHC: 34.5 g/dL (ref 30.0–36.0)
MCV: 93 fL (ref 78.0–100.0)
MONO ABS: 0.6 10*3/uL (ref 0.1–1.0)
Monocytes Relative: 9 % (ref 3–12)
NEUTROS ABS: 4 10*3/uL (ref 1.7–7.7)
NEUTROS PCT: 62 % (ref 43–77)
PLATELETS: 177 10*3/uL (ref 150–400)
RBC: 4.83 MIL/uL (ref 4.22–5.81)
RDW: 14.1 % (ref 11.5–15.5)
WBC: 6.5 10*3/uL (ref 4.0–10.5)

## 2013-07-31 LAB — PSA: PSA: 1.3 ng/mL (ref <=4.00)

## 2013-07-31 MED ORDER — ATORVASTATIN CALCIUM 10 MG PO TABS: 10.0000 mg | ORAL_TABLET | Freq: Every day | ORAL | Status: DC

## 2013-07-31 MED ORDER — AMLODIPINE BESYLATE 5 MG PO TABS: 5.0000 mg | ORAL_TABLET | Freq: Every day | ORAL | Status: DC

## 2013-07-31 NOTE — Assessment & Plan Note (Signed)
I will continue him on the Flomax he has labs today and a PSA will be obtained. He will be referred to urology as I cannot quite pinpoint why he is having these urinary symptoms. Of note he had hematuria in the past and did have a scoping done but everything was benign

## 2013-07-31 NOTE — Assessment & Plan Note (Signed)
I will plan to decrease his amlodipine to 5 mg once a day

## 2013-07-31 NOTE — Progress Notes (Signed)
Patient ID: Kyle Rivers, male   DOB: November 05, 1955, 58 y.o.   MRN: [id number]   Subjective:    Patient ID: Kyle Rivers, male    DOB: 05-05-1956, 58 y.o.   MRN: [id number]  Patient presents for 6 week F/U  Patient here to follow chronic medical problems. At her last couple of visits he's been having some lower abdominal suprapubic pressure sometimes states like he feels like he needs to empty his bladder but does not have to urinate. He has CT scan of abdomen pelvis done which the bowel was normal however he did have enlarged prostate. He was initially treated for prostatitis after he came in with dysuria his symptoms improved but returned as well CT scan was ordered. I started him on Flomax about 4 weeks ago he states for the first 2-3 weeks all of his pressure and pain had resolved however then he started drinking some coffee on a business trip and he is now having recurrence of the symptoms. He's not had any hematuria that is noted he is no difficulties with his bowel movements no nausea vomiting associated.  Hypertension-he actually tapered himself off of the metoprolol over the past 2-3 months states that he was getting a little lightheaded and noticed that his blood pressure was dropping at home. Currently right now he typically takes the cauduet  every other day as he was concerned about his low pressure   Review Of Systems:  GEN- denies fatigue, fever, weight loss,weakness, recent illness HEENT- denies eye drainage, change in vision, nasal discharge, CVS- denies chest pain, palpitations RESP- denies SOB, cough, wheeze ABD- denies N/V, change in stools, abd pain GU- denies dysuria, hematuria, dribbling, incontinence MSK- denies joint pain, muscle aches, injury Neuro- denies headache, dizziness, syncope, seizure activity       Objective:    BP 138/82  Pulse 86  Temp(Src) 98.4 F (36.9 C)  Resp 14  Ht 5' 5.5" (1.664 m)  Wt 145 lb (65.772 kg)  BMI 23.75 kg/m2 GEN- NAD, alert  and oriented x3 HEENT- PERRL, EOMI, non injected sclera, pink conjunctiva, MMM, oropharynx clear CVS- RRR, no murmur RESP-CTAB EXT- No edema Pulses- Radial, DP- 2+        Assessment & Plan:      Problem List Items Addressed This Visit   HYPERTENSION - Primary   Relevant Medications      atorvastatin (LIPITOR) tablet      amLODIpine (NORVASC) tablet   Other Relevant Orders      CBC with Differential      Comprehensive metabolic panel   HYPERLIPIDEMIA   Relevant Orders      Lipid panel   BPH (benign prostatic hypertrophy)   Relevant Orders      PSA    Other Visit Diagnoses   BPH (benign prostatic hyperplasia)           Note: This dictation was prepared with Dragon dictation along with smaller phrase technology. Any transcriptional errors that result from this process are unintentional.

## 2013-07-31 NOTE — Patient Instructions (Signed)
Norvasc decreased to 5mg   Continue lipitor Referral to Urology We will call with lab results F/U 4 months

## 2013-07-31 NOTE — Assessment & Plan Note (Signed)
Check fasting labs and I will titrate his Lipitor as needed

## 2013-08-02 ENCOUNTER — Encounter: Payer: Self-pay | Admitting: *Deleted

## 2013-10-18 ENCOUNTER — Telehealth: Payer: Self-pay | Admitting: *Deleted

## 2013-10-18 NOTE — Telephone Encounter (Signed)
Returned call to patient.   Reports that dermatology states that Caduet could be cause of skin irritation.   MD please advise.

## 2013-10-18 NOTE — Telephone Encounter (Signed)
Message copied by Sheral Flow on Wed Oct 18, 2013  4:32 PM ------      Message from: Devoria Glassing      Created: Wed Oct 18, 2013  3:27 PM       Patient would like a call back, he went to his dermatologist and they told him one of the medication that we prescribe for him may be causing his bumps please call him back at [phone] ------

## 2013-10-20 NOTE — Telephone Encounter (Signed)
At the last visit I split the medications into norvasc and lipitor Have him stop the lipitor for 2 weeks see if rash clears, if no change restart and stop the norvasc I doubt his rash is related to this,

## 2013-10-20 NOTE — Telephone Encounter (Signed)
Call placed to patient and patient made aware.  

## 2013-12-01 ENCOUNTER — Ambulatory Visit: Payer: BC Managed Care – PPO | Admitting: Family Medicine

## 2013-12-12 ENCOUNTER — Other Ambulatory Visit: Payer: Self-pay | Admitting: *Deleted

## 2013-12-12 MED ORDER — TAMSULOSIN HCL 0.4 MG PO CAPS: 0.4000 mg | ORAL_CAPSULE | Freq: Every day | ORAL | Status: DC

## 2013-12-12 NOTE — Telephone Encounter (Signed)
Received fax requesting refill on Flomax.   Refill appropriate and filled per protocol.

## 2013-12-20 ENCOUNTER — Ambulatory Visit: Payer: BC Managed Care – PPO | Admitting: Family Medicine

## 2014-04-02 ENCOUNTER — Encounter: Payer: Self-pay | Admitting: Family Medicine

## 2014-04-02 ENCOUNTER — Ambulatory Visit (INDEPENDENT_AMBULATORY_CARE_PROVIDER_SITE_OTHER): Payer: BC Managed Care – PPO | Admitting: Family Medicine

## 2014-04-02 VITALS — BP 150/78 | HR 76 | Temp 99.0°F | Resp 14 | Ht 67.0 in | Wt 143.0 lb

## 2014-04-02 DIAGNOSIS — E785 Hyperlipidemia, unspecified: Secondary | ICD-10-CM

## 2014-04-02 DIAGNOSIS — Z23 Encounter for immunization: Secondary | ICD-10-CM

## 2014-04-02 DIAGNOSIS — H547 Unspecified visual loss: Secondary | ICD-10-CM

## 2014-04-02 DIAGNOSIS — I10 Essential (primary) hypertension: Secondary | ICD-10-CM

## 2014-04-02 MED ORDER — AMLODIPINE BESYLATE 10 MG PO TABS: 10.0000 mg | ORAL_TABLET | Freq: Every day | ORAL | Status: DC

## 2014-04-02 MED ORDER — TAMSULOSIN HCL 0.4 MG PO CAPS: 0.4000 mg | ORAL_CAPSULE | Freq: Every day | ORAL | Status: DC

## 2014-04-02 NOTE — Assessment & Plan Note (Signed)
Increase norvasc to 10mg  once a day Check BP at home

## 2014-04-02 NOTE — Assessment & Plan Note (Signed)
Significant change in right eye, ? Related to elevated BP , no headache associated, no vision loss Send to urgent appt with eye doctor

## 2014-04-02 NOTE — Progress Notes (Signed)
Patient ID: Kyle Rivers, male   DOB: Apr 16, 1956, 58 y.o.   MRN: [id number]   Subjective:    Patient ID: Kyle Rivers, male    DOB: 1955/07/23, 58 y.o.   MRN: [id number]  Patient presents for 4 month F/U; [place] Changes; and R eye Issue patient here for follow-up chronic medical problems. History of hypertension. I think 5 mg last visit he had his blood pressures and increase especially after he had an upper respiratory infection using a lot of over-the-counter medications including decongestants as well as pressure went to the 170s over low 100s starting to get down when he would take 10 mg of amlodipine every now and as well use garlic supplementation.  He was seen by dermatology for rash medications, they thought it may have been due to his medications, he stopped them all and noted lipitor was the cause, note he has been on this for the past 3 years or more. He discontinued a few months ago  Right eye vision changes, sees floaters and spots, noticed vision was going bad about a month ago, does not have an eye doctor. He does remember getting a chemical pain thinner in his eye a few months ago otherwise no other injury   Review Of Systems:  GEN- denies fatigue, fever, weight loss,weakness, recent illness HEENT- denies eye drainage, +change in vision, nasal discharge, CVS- denies chest pain, palpitations RESP- denies SOB, cough, wheeze ABD- denies N/V, change in stools, abd pain GU- denies dysuria, hematuria, dribbling, incontinence MSK- denies joint pain, muscle aches, injury Neuro- denies headache, dizziness, syncope, seizure activity       Objective:    BP 150/78 mmHg  Pulse 76  Temp(Src) 99 F (37.2 C) (Oral)  Resp 14  Ht 5\' 7"  (1.702 m)  Wt 143 lb (64.864 kg)  BMI 22.39 kg/m2 GEN- NAD, alert and oriented x3 HEENT- PERRL, EOMI, non injected sclera, pink conjunctiva, MMM, oropharynx clear, fundus benign appearing Neck- Supple,  CVS- RRR, no murmur RESP-CTAB EXT-  No edema Pulses- Radial 2+        Assessment & Plan:      Problem List Items Addressed This Visit    Hyperlipidemia   Relevant Medications      amLODIpine (NORVASC) tablet   Essential hypertension - Primary   Relevant Medications      amLODIpine (NORVASC) tablet      Note: This dictation was prepared with Dragon dictation along with smaller phrase technology. Any transcriptional errors that result from this process are unintentional.

## 2014-04-02 NOTE — Assessment & Plan Note (Signed)
Recheck lipids off Lipitor

## 2014-04-02 NOTE — Patient Instructions (Signed)
Referral to eye doctor--- Gershon Crane Increased norvasc to 10mg  Flu shot given Return for fasting labs  F/U 4 months

## 2014-06-20 ENCOUNTER — Other Ambulatory Visit: Payer: BLUE CROSS/BLUE SHIELD

## 2014-06-20 ENCOUNTER — Other Ambulatory Visit: Payer: Self-pay | Admitting: *Deleted

## 2014-06-20 DIAGNOSIS — E785 Hyperlipidemia, unspecified: Secondary | ICD-10-CM

## 2014-06-20 DIAGNOSIS — Z0000 Encounter for general adult medical examination without abnormal findings: Secondary | ICD-10-CM

## 2014-06-20 LAB — CBC WITH DIFFERENTIAL/PLATELET
Basophils Absolute: 0.1 10*3/uL (ref 0.0–0.1)
Basophils Relative: 1 % (ref 0–1)
Eosinophils Absolute: 0.2 10*3/uL (ref 0.0–0.7)
Eosinophils Relative: 3 % (ref 0–5)
HCT: 43.7 % (ref 39.0–52.0)
Hemoglobin: 15.3 g/dL (ref 13.0–17.0)
LYMPHS ABS: 2.3 10*3/uL (ref 0.7–4.0)
LYMPHS PCT: 34 % (ref 12–46)
MCH: 32.7 pg (ref 26.0–34.0)
MCHC: 35 g/dL (ref 30.0–36.0)
MCV: 93.4 fL (ref 78.0–100.0)
MONO ABS: 0.5 10*3/uL (ref 0.1–1.0)
MONOS PCT: 7 % (ref 3–12)
MPV: 10.9 fL (ref 8.6–12.4)
NEUTROS PCT: 55 % (ref 43–77)
Neutro Abs: 3.8 10*3/uL (ref 1.7–7.7)
Platelets: 209 10*3/uL (ref 150–400)
RBC: 4.68 MIL/uL (ref 4.22–5.81)
RDW: 13.7 % (ref 11.5–15.5)
WBC: 6.9 10*3/uL (ref 4.0–10.5)

## 2014-06-20 LAB — COMPLETE METABOLIC PANEL WITH GFR
ALK PHOS: 82 U/L (ref 39–117)
ALT: 10 U/L (ref 0–53)
AST: 15 U/L (ref 0–37)
Albumin: 4.3 g/dL (ref 3.5–5.2)
BILIRUBIN TOTAL: 0.6 mg/dL (ref 0.2–1.2)
BUN: 10 mg/dL (ref 6–23)
CO2: 29 mEq/L (ref 19–32)
Calcium: 9.9 mg/dL (ref 8.4–10.5)
Chloride: 102 mEq/L (ref 96–112)
Creat: 1.1 mg/dL (ref 0.50–1.35)
GFR, EST NON AFRICAN AMERICAN: 74 mL/min
GFR, Est African American: 85 mL/min
Glucose, Bld: 88 mg/dL (ref 70–99)
Potassium: 4.6 mEq/L (ref 3.5–5.3)
Sodium: 140 mEq/L (ref 135–145)
TOTAL PROTEIN: 7.8 g/dL (ref 6.0–8.3)

## 2014-06-20 LAB — LIPID PANEL
CHOLESTEROL: 247 mg/dL — AB (ref 0–200)
HDL: 61 mg/dL (ref >39)
LDL Cholesterol: 168 mg/dL — ABNORMAL HIGH (ref 0–99)
Total CHOL/HDL Ratio: 4 Ratio
Triglycerides: 90 mg/dL (ref <150)
VLDL: 18 mg/dL (ref 0–40)

## 2014-07-20 ENCOUNTER — Telehealth: Payer: Self-pay | Admitting: *Deleted

## 2014-07-20 NOTE — Telephone Encounter (Signed)
Received call from patient.   Reports that he has been taking fish oil for cholesterol.  States that he noted that he began having bumps appear all over his skin again. Denies any SOB or breathing difficulties.   Advised to stop taking the fish oil and schedule appointment with MD.

## 2014-07-23 ENCOUNTER — Ambulatory Visit (INDEPENDENT_AMBULATORY_CARE_PROVIDER_SITE_OTHER): Payer: BLUE CROSS/BLUE SHIELD | Admitting: Family Medicine

## 2014-07-23 ENCOUNTER — Encounter: Payer: Self-pay | Admitting: Family Medicine

## 2014-07-23 VITALS — BP 132/78 | HR 80 | Temp 98.6°F | Resp 18 | Wt 139.0 lb

## 2014-07-23 DIAGNOSIS — T7840XA Allergy, unspecified, initial encounter: Secondary | ICD-10-CM

## 2014-07-23 DIAGNOSIS — E785 Hyperlipidemia, unspecified: Secondary | ICD-10-CM

## 2014-07-23 NOTE — Patient Instructions (Addendum)
Benadryl and topical 1% cortisone Omega 3 twice a day  Benadryl as needed  Topical cortisone  Change follow-up to June

## 2014-07-23 NOTE — Assessment & Plan Note (Signed)
Does not tolerate statins due to allergic reaction. This point we'll use his diet as well as patient will get him up to 2000 mg once a day. I will consider trying him on zetia or TriCor in the future

## 2014-07-23 NOTE — Progress Notes (Signed)
Patient ID: Kyle Rivers, male   DOB: 1955-06-11, 59 y.o.   MRN: [id number]     Subjective:    Patient ID: Kyle Rivers, male    DOB: 09-10-1955, 59 y.o.   MRN: [id number]  Patient presents for OTHER  patient here with allergic reaction. I gave him samples of Crestor 5 mg and he broke out in itchy red bumps on his chest as well as his arms. He stopped the medication a couple days ago but still has a rash on his chest. He has not tried any over-the-counter medications such as Benadryl. This is the same rash that he had with the Lipitor that would come and go per report. He is taking garlic and restarted on fish oil recently to help with his cholesterol.    Review Of Systems:  GEN- denies fatigue, fever, weight loss,weakness, recent illness HEENT- denies eye drainage, change in vision, nasal discharge, CVS- denies chest pain, palpitations RESP- denies SOB, cough, wheeze ABD- denies N/V, change in stools, abd pain GU- denies dysuria, hematuria, dribbling, incontinence MSK- denies joint pain, muscle aches, injury Neuro- denies headache, dizziness, syncope, seizure activity       Objective:    BP 132/78 mmHg  Pulse 80  Temp(Src) 98.6 F (37 C) (Oral)  Resp 18  Wt 139 lb (63.05 kg) GEN- NAD, alert and oriented x3 Skin- scattered maculoparpular rash on abdomen, chest, few lesions on back, mild erythema bilat forearm, palms spared, no oral lesions, few excoriations       Assessment & Plan:      Problem List Items Addressed This Visit    None    Visit Diagnoses    Allergic reaction, initial encounter    -  Primary    The patient had allergic reaction to statin drugs. He will try Benadryl and topical cortisone he does not want to take oral prednisone       Note: This dictation was prepared with Dragon dictation along with smaller phrase technology. Any transcriptional errors that result from this process are unintentional.   \

## 2014-07-23 NOTE — Telephone Encounter (Signed)
noted 

## 2014-08-14 ENCOUNTER — Encounter: Payer: Self-pay | Admitting: Family Medicine

## 2014-08-14 ENCOUNTER — Ambulatory Visit (INDEPENDENT_AMBULATORY_CARE_PROVIDER_SITE_OTHER): Payer: BLUE CROSS/BLUE SHIELD | Admitting: Family Medicine

## 2014-08-14 VITALS — BP 136/80 | HR 78 | Temp 98.7°F | Resp 14 | Ht 67.0 in | Wt 140.0 lb

## 2014-08-14 DIAGNOSIS — K648 Other hemorrhoids: Secondary | ICD-10-CM | POA: Diagnosis not present

## 2014-08-14 NOTE — Patient Instructions (Addendum)
Watch your diet Call for any changes with your bowels  F/U as needed

## 2014-08-14 NOTE — Progress Notes (Signed)
Patient ID: Kyle Rivers, male   DOB: 17-Oct-1955, 59 y.o.   MRN: [id number]   Subjective:    Patient ID: Kyle Rivers, male    DOB: 1955-06-24, 59 y.o.   MRN: [id number]  Patient presents for Blood noted in Stool  Patient here because he noted blood in his stool for about 24 hours. He states that he thinks he ate some bad fruit and chocolate mix to gather he began having diarrhea he had about 2 or 3 stools and he noticed some bright red blood after wiping. He has not had any straining recently no constipation recently but he has had on and off. He has history of hemorrhoids and has had hemorrhoidal surgery. His bowels are now back to normal he has not had any vomiting his nausea also resolved. There are no urinary symptoms. He has not seen any blood since early Sunday morning No new meds  Review Of Systems:  GEN- denies fatigue, fever, weight loss,weakness, recent illness HEENT- denies eye drainage, change in vision, nasal discharge, CVS- denies chest pain, palpitations RESP- denies SOB, cough, wheeze ABD- denies N/V, +change in stools, abd pain GU- denies dysuria, hematuria, dribbling, incontinence MSK- denies joint pain, muscle aches, injury Neuro- denies headache, dizziness, syncope, seizure activity       Objective:    BP 136/80 mmHg  Pulse 78  Temp(Src) 98.7 F (37.1 C) (Oral)  Resp 14  Ht 5\' 7"  (1.702 m)  Wt 140 lb (63.504 kg)  BMI 21.92 kg/m2 GEN- NAD, alert and oriented x3 CVS- RRR, no murmur RESP-CTAB ABD-NABS,soft,NT,ND GU- normal tone, tag noted, no gross blood, prostate smooth, FOBT neg, soft stool in vault Pulses- Radial,- 2+        Assessment & Plan:      Problem List Items Addressed This Visit    None    Visit Diagnoses    Internal hemorrhoid    -  Primary    treat as hemorroidal bleed, quickly resolved, history of hemorroids, will monitor diet, not strain with BM, if returns will get him back with GI       Note: This dictation was prepared  with Dragon dictation along with smaller phrase technology. Any transcriptional errors that result from this process are unintentional.

## 2014-09-07 ENCOUNTER — Ambulatory Visit: Payer: BC Managed Care – PPO | Admitting: Family Medicine

## 2014-09-10 ENCOUNTER — Ambulatory Visit: Payer: BLUE CROSS/BLUE SHIELD | Admitting: Family Medicine

## 2014-10-26 ENCOUNTER — Encounter: Payer: Self-pay | Admitting: Family Medicine

## 2014-10-26 ENCOUNTER — Ambulatory Visit (INDEPENDENT_AMBULATORY_CARE_PROVIDER_SITE_OTHER): Payer: BLUE CROSS/BLUE SHIELD | Admitting: Family Medicine

## 2014-10-26 VITALS — BP 134/72 | HR 68 | Temp 98.5°F | Resp 14 | Ht 67.0 in | Wt 133.0 lb

## 2014-10-26 DIAGNOSIS — I10 Essential (primary) hypertension: Secondary | ICD-10-CM

## 2014-10-26 DIAGNOSIS — N400 Enlarged prostate without lower urinary tract symptoms: Secondary | ICD-10-CM

## 2014-10-26 DIAGNOSIS — E785 Hyperlipidemia, unspecified: Secondary | ICD-10-CM

## 2014-10-26 NOTE — Progress Notes (Signed)
Patient ID: Kyle Rivers, male   DOB: 11-Jan-1956, 59 y.o.   MRN: [id number]   Subjective:    Patient ID: Kyle Rivers, male    DOB: [birth year], 59 y.o.   MRN: [id number]  Patient presents for 3 month F/U  Patient here to follow chronic medical problems. He has no particular concerns. He is doing well on his medications. He is due for fasting labs today. He is trying to work out and stay active. His bowels are back to baseline   Review Of Systems:  GEN- denies fatigue, fever, weight loss,weakness, recent illness HEENT- denies eye drainage, change in vision, nasal discharge, CVS- denies chest pain, palpitations RESP- denies SOB, cough, wheeze ABD- denies N/V, change in stools, abd pain GU- denies dysuria, hematuria, dribbling, incontinence MSK- denies joint pain, muscle aches, injury Neuro- denies headache, dizziness, syncope, seizure activity       Objective:    BP 134/72 mmHg  Pulse 68  Temp(Src) 98.5 F (36.9 C) (Oral)  Resp 14  Ht 5\' 7"  (1.702 m)  Wt 133 lb (60.328 kg)  BMI 20.83 kg/m2 GEN- NAD, alert and oriented x3 HEENT- PERRL, EOMI, non injected sclera, pink conjunctiva, MMM, oropharynx clear CVS- RRR, no murmur RESP-CTAB ABD-NABS,soft,NT,ND EXT- No edema Pulses- Radial, 2+        Assessment & Plan:      Problem List Items Addressed This Visit    Hyperlipidemia - Primary   Relevant Orders   Lipid panel   Essential hypertension   Relevant Orders   CBC with Differential/Platelet   Comprehensive metabolic panel   BPH (benign prostatic hypertrophy)      Note: This dictation was prepared with Dragon dictation along with smaller phrase technology. Any transcriptional errors that result from this process are unintentional.

## 2014-10-26 NOTE — Assessment & Plan Note (Signed)
Blood pressure well controlled that changed her medication

## 2014-10-26 NOTE — Patient Instructions (Signed)
We will call with lab results Continue current medications F/U Physical

## 2014-10-26 NOTE — Assessment & Plan Note (Signed)
Symptoms are controlled with Flomax

## 2014-10-26 NOTE — Assessment & Plan Note (Signed)
Recheck cholesterol levels. We'll likely have to try him on zetia or TriCor he does not tolerate the statins

## 2014-10-27 LAB — COMPREHENSIVE METABOLIC PANEL
ALT: 12 U/L (ref 0–53)
AST: 17 U/L (ref 0–37)
Albumin: 4.2 g/dL (ref 3.5–5.2)
Alkaline Phosphatase: 80 U/L (ref 39–117)
BUN: 7 mg/dL (ref 6–23)
CALCIUM: 9.5 mg/dL (ref 8.4–10.5)
CO2: 27 meq/L (ref 19–32)
Chloride: 101 mEq/L (ref 96–112)
Creat: 1.03 mg/dL (ref 0.50–1.35)
Glucose, Bld: 69 mg/dL — ABNORMAL LOW (ref 70–99)
POTASSIUM: 4 meq/L (ref 3.5–5.3)
SODIUM: 139 meq/L (ref 135–145)
TOTAL PROTEIN: 7.8 g/dL (ref 6.0–8.3)
Total Bilirubin: 0.6 mg/dL (ref 0.2–1.2)

## 2014-10-27 LAB — CBC WITH DIFFERENTIAL/PLATELET
BASOS ABS: 0.1 10*3/uL (ref 0.0–0.1)
BASOS PCT: 1 % (ref 0–1)
EOS ABS: 0.1 10*3/uL (ref 0.0–0.7)
EOS PCT: 2 % (ref 0–5)
HCT: 44.9 % (ref 39.0–52.0)
Hemoglobin: 15.5 g/dL (ref 13.0–17.0)
LYMPHS PCT: 33 % (ref 12–46)
Lymphs Abs: 2.3 10*3/uL (ref 0.7–4.0)
MCH: 32.8 pg (ref 26.0–34.0)
MCHC: 34.5 g/dL (ref 30.0–36.0)
MCV: 94.9 fL (ref 78.0–100.0)
MONO ABS: 0.6 10*3/uL (ref 0.1–1.0)
MPV: 11 fL (ref 8.6–12.4)
Monocytes Relative: 8 % (ref 3–12)
Neutro Abs: 4 10*3/uL (ref 1.7–7.7)
Neutrophils Relative %: 56 % (ref 43–77)
PLATELETS: 200 10*3/uL (ref 150–400)
RBC: 4.73 MIL/uL (ref 4.22–5.81)
RDW: 14.3 % (ref 11.5–15.5)
WBC: 7.1 10*3/uL (ref 4.0–10.5)

## 2014-10-27 LAB — LIPID PANEL
Cholesterol: 227 mg/dL — ABNORMAL HIGH (ref 0–200)
HDL: 80 mg/dL (ref >=40)
LDL Cholesterol: 134 mg/dL — ABNORMAL HIGH (ref 0–99)
TRIGLYCERIDES: 65 mg/dL (ref <150)
Total CHOL/HDL Ratio: 2.8 Ratio
VLDL: 13 mg/dL (ref 0–40)

## 2014-10-31 ENCOUNTER — Encounter: Payer: Self-pay | Admitting: *Deleted

## 2014-11-16 ENCOUNTER — Ambulatory Visit (INDEPENDENT_AMBULATORY_CARE_PROVIDER_SITE_OTHER): Payer: BLUE CROSS/BLUE SHIELD | Admitting: Family Medicine

## 2014-11-16 ENCOUNTER — Encounter: Payer: Self-pay | Admitting: Family Medicine

## 2014-11-16 VITALS — BP 134/70 | HR 78 | Temp 98.7°F | Resp 14 | Ht 67.0 in | Wt 131.0 lb

## 2014-11-16 DIAGNOSIS — N410 Acute prostatitis: Secondary | ICD-10-CM | POA: Diagnosis not present

## 2014-11-16 LAB — URINALYSIS, ROUTINE W REFLEX MICROSCOPIC
Bilirubin Urine: NEGATIVE
GLUCOSE, UA: NEGATIVE mg/dL
HGB URINE DIPSTICK: NEGATIVE
Ketones, ur: NEGATIVE mg/dL
LEUKOCYTES UA: NEGATIVE
NITRITE: NEGATIVE
PROTEIN: NEGATIVE mg/dL
SPECIFIC GRAVITY, URINE: 1.02 (ref 1.005–1.030)
UROBILINOGEN UA: 0.2 mg/dL (ref 0.0–1.0)
pH: 6 (ref 5.0–8.0)

## 2014-11-16 MED ORDER — CIPROFLOXACIN HCL 500 MG PO TABS: 500.0000 mg | ORAL_TABLET | Freq: Two times a day (BID) | ORAL | Status: DC

## 2014-11-16 NOTE — Patient Instructions (Signed)
Start the antibiotics  Continue flomax F/U as needed

## 2014-11-16 NOTE — Progress Notes (Signed)
Patient ID: Kyle Rivers, male   DOB: [birth year], 59 y.o.   MRN: [id number]   Subjective:    Patient ID: Kyle Rivers, male    DOB: 1955/05/15, 59 y.o.   MRN: [id number]  Patient presents for Lower Abdominal Pain  Pt  Here with recurrent suprapbuc pain, feels like previous prostate infections. No pain with urination has some pressure like feeling, has been taking golden seal BID which has helped some over past week. No fever, no N/V no change in bowels. Taking flomax as prescribed    Review Of Systems:  GEN- denies fatigue, fever, weight loss,weakness, recent illness HEENT- denies eye drainage, change in vision, nasal discharge, CVS- denies chest pain, palpitations RESP- denies SOB, cough, wheeze ABD- denies N/V, change in stools,+ abd pain GU- denies dysuria, hematuria, dribbling, incontinence MSK- denies joint pain, muscle aches, injury Neuro- denies headache, dizziness, syncope, seizure activity       Objective:    BP 134/70 mmHg  Pulse 78  Temp(Src) 98.7 F (37.1 C) (Oral)  Resp 14  Ht 5\' 7"  (1.702 m)  Wt 131 lb (59.421 kg)  BMI 20.51 kg/m2 GEN- NAD, alert and oriented x3 CVS- RRR, no murmur RESP-CTAB ABD-NABS,soft,mild suprapubic tenderness, no CVA tenderness EXT- No edema Pulses- Radial, 2+        Assessment & Plan:      Problem List Items Addressed This Visit    None    Visit Diagnoses    Acute prostatitis    -  Primary    Treat for recurrent prostatitis based on symptoms and history, cipro x 10 days, continue flomax    Relevant Orders    Urinalysis, Routine w reflex microscopic (not at [facility]) (Completed)       Note: This dictation was prepared with Dragon dictation along with smaller phrase technology. Any transcriptional errors that result from this process are unintentional.

## 2014-11-29 ENCOUNTER — Telehealth: Payer: Self-pay | Admitting: Family Medicine

## 2014-11-29 NOTE — Telephone Encounter (Signed)
Have completed the 10 days of antibiotic.  Wants to let you know he is still having some discomfort "down there"  Not as bad but still there.  Please advise?

## 2014-11-29 NOTE — Telephone Encounter (Signed)
Patient would like a call back regarding the discomfort he is still having in his stomach  [phone]

## 2014-11-30 MED ORDER — CIPROFLOXACIN HCL 500 MG PO TABS: 500.0000 mg | ORAL_TABLET | Freq: Two times a day (BID) | ORAL | Status: DC

## 2014-11-30 NOTE — Telephone Encounter (Signed)
Send in another 7 days of Cipro. If still not improved we will need to schedule with urology again

## 2014-11-30 NOTE — Telephone Encounter (Signed)
Left pt message with provider recommendations 

## 2015-01-15 ENCOUNTER — Encounter: Payer: Self-pay | Admitting: Family Medicine

## 2015-01-15 ENCOUNTER — Other Ambulatory Visit: Payer: Self-pay | Admitting: Family Medicine

## 2015-01-15 MED ORDER — AMLODIPINE BESYLATE 10 MG PO TABS: 10.0000 mg | ORAL_TABLET | Freq: Every day | ORAL | Status: DC

## 2015-01-15 MED ORDER — TAMSULOSIN HCL 0.4 MG PO CAPS: 0.4000 mg | ORAL_CAPSULE | Freq: Every day | ORAL | Status: DC

## 2015-01-15 NOTE — Telephone Encounter (Signed)
Medication refilled per protocol. 

## 2015-04-15 ENCOUNTER — Other Ambulatory Visit: Payer: Self-pay | Admitting: *Deleted

## 2015-04-15 MED ORDER — AMLODIPINE BESYLATE 10 MG PO TABS: 10.0000 mg | ORAL_TABLET | Freq: Every day | ORAL | Status: DC

## 2015-04-15 MED ORDER — TAMSULOSIN HCL 0.4 MG PO CAPS: 0.4000 mg | ORAL_CAPSULE | Freq: Every day | ORAL | Status: DC

## 2015-04-15 NOTE — Telephone Encounter (Signed)
Received fax requesting refill on Flomax and Norvasc.   Refill appropriate and filled per protocol.

## 2015-07-22 ENCOUNTER — Other Ambulatory Visit: Payer: Self-pay | Admitting: *Deleted

## 2015-07-22 MED ORDER — AMLODIPINE BESYLATE 10 MG PO TABS: 10.0000 mg | ORAL_TABLET | Freq: Every day | ORAL | Status: DC

## 2015-07-22 MED ORDER — TAMSULOSIN HCL 0.4 MG PO CAPS: 0.4000 mg | ORAL_CAPSULE | Freq: Every day | ORAL | Status: DC

## 2015-07-22 NOTE — Telephone Encounter (Signed)
Received fax requesting refill on Norvasc and Flomax.   Refill appropriate and filled per protocol.

## 2015-07-26 ENCOUNTER — Encounter: Payer: Self-pay | Admitting: Family Medicine

## 2015-07-26 ENCOUNTER — Ambulatory Visit (INDEPENDENT_AMBULATORY_CARE_PROVIDER_SITE_OTHER): Payer: BLUE CROSS/BLUE SHIELD | Admitting: Family Medicine

## 2015-07-26 VITALS — BP 126/68 | HR 80 | Temp 98.5°F | Resp 14 | Ht 67.0 in | Wt 138.0 lb

## 2015-07-26 DIAGNOSIS — R1011 Right upper quadrant pain: Secondary | ICD-10-CM | POA: Diagnosis not present

## 2015-07-26 NOTE — Patient Instructions (Addendum)
We will call with lab results Continue nexium take twice a day  F/U as pending results

## 2015-07-26 NOTE — Progress Notes (Signed)
Patient ID: Kyle Rivers, male   DOB: [birth year], 60 y.o.   MRN: [id number]    Subjective:    Patient ID: Kyle Rivers, male    DOB: May 27, 1955, 60 y.o.   MRN: [id number]  Patient presents for Pain Patient here with right upper quadrant pain on and off for the past few weeks. He has not had any change in his bowel denies any dysuria or hematuria. He does not recall any particular injury. He states that as a nagging feeling the right upper quadrant and other times he also feels like there is pressure. He is taking his Nexium as prescribed. When he belches he does get some mild relief. He also notices some discomfort when he eats.    Review Of Systems:  GEN- denies fatigue, fever, weight loss,weakness, recent illness HEENT- denies eye drainage, change in vision, nasal discharge, CVS- denies chest pain, palpitations RESP- denies SOB, cough, wheeze ABD- denies N/V, change in stools, +abd pain GU- denies dysuria, hematuria, dribbling, incontinence MSK- denies joint pain, muscle aches, injury Neuro- denies headache, dizziness, syncope, seizure activity       Objective:    BP 126/68 mmHg  Pulse 80  Temp(Src) 98.5 F (36.9 C) (Oral)  Resp 14  Ht 5\' 7"  (1.702 m)  Wt 138 lb (62.596 kg)  BMI 21.61 kg/m2 GEN- NAD, alert and oriented x3 HEENT- PERRL, EOMI, non injected sclera, pink conjunctiva, MMM, oropharynx clear Neck- Supple, no LAD CVS- RRR, no murmur RESP-CTAB ABD-NABS,soft,mild TTP RUQ, no rebound, no guarding, no CVA tenderness          Assessment & Plan:      Problem List Items Addressed This Visit    None    Visit Diagnoses    Right upper quadrant abdominal pain    -  Primary    DD, GERD/Ulcers vs gallbladder etiology, increase nexium to BID and obtain CMET, CBC, Lipase, will need RUQ [place]     Relevant Orders    CBC with Differential/Platelet (Completed)    Comprehensive metabolic panel (Completed)    Lipase (Completed)       Note: This dictation was  prepared with Dragon dictation along with smaller phrase technology. Any transcriptional errors that result from this process are unintentional.

## 2015-07-27 LAB — CBC WITH DIFFERENTIAL/PLATELET
BASOS ABS: 0 10*3/uL (ref 0.0–0.1)
Basophils Relative: 0 % (ref 0–1)
EOS ABS: 0.1 10*3/uL (ref 0.0–0.7)
EOS PCT: 2 % (ref 0–5)
HCT: 45.8 % (ref 39.0–52.0)
Hemoglobin: 15.7 g/dL (ref 13.0–17.0)
LYMPHS ABS: 2.4 10*3/uL (ref 0.7–4.0)
Lymphocytes Relative: 37 % (ref 12–46)
MCH: 32.5 pg (ref 26.0–34.0)
MCHC: 34.3 g/dL (ref 30.0–36.0)
MCV: 94.8 fL (ref 78.0–100.0)
MPV: 11.1 fL (ref 8.6–12.4)
Monocytes Absolute: 0.4 10*3/uL (ref 0.1–1.0)
Monocytes Relative: 7 % (ref 3–12)
Neutro Abs: 3.5 10*3/uL (ref 1.7–7.7)
Neutrophils Relative %: 54 % (ref 43–77)
PLATELETS: 200 10*3/uL (ref 150–400)
RBC: 4.83 MIL/uL (ref 4.22–5.81)
RDW: 13.9 % (ref 11.5–15.5)
WBC: 6.4 10*3/uL (ref 4.0–10.5)

## 2015-07-27 LAB — COMPREHENSIVE METABOLIC PANEL
ALT: 13 U/L (ref 9–46)
AST: 19 U/L (ref 10–35)
Albumin: 4.3 g/dL (ref 3.6–5.1)
Alkaline Phosphatase: 84 U/L (ref 40–115)
BUN: 11 mg/dL (ref 7–25)
CHLORIDE: 101 mmol/L (ref 98–110)
CO2: 29 mmol/L (ref 20–31)
Calcium: 9.7 mg/dL (ref 8.6–10.3)
Creat: 1.09 mg/dL (ref 0.70–1.33)
GLUCOSE: 89 mg/dL (ref 70–99)
POTASSIUM: 3.8 mmol/L (ref 3.5–5.3)
Sodium: 140 mmol/L (ref 135–146)
Total Bilirubin: 0.5 mg/dL (ref 0.2–1.2)
Total Protein: 8 g/dL (ref 6.1–8.1)

## 2015-07-27 LAB — LIPASE: Lipase: 41 U/L (ref 7–60)

## 2015-07-28 ENCOUNTER — Encounter: Payer: Self-pay | Admitting: Family Medicine

## 2015-07-29 ENCOUNTER — Other Ambulatory Visit: Payer: Self-pay | Admitting: *Deleted

## 2015-07-29 DIAGNOSIS — R1011 Right upper quadrant pain: Secondary | ICD-10-CM

## 2015-08-01 ENCOUNTER — Ambulatory Visit (HOSPITAL_COMMUNITY): Payer: BLUE CROSS/BLUE SHIELD

## 2015-08-02 ENCOUNTER — Ambulatory Visit (HOSPITAL_COMMUNITY)
Admission: RE | Admit: 2015-08-02 | Discharge: 2015-08-02 | Disposition: A | Discharge status: Home / Self Care | Payer: BLUE CROSS/BLUE SHIELD | Source: Ambulatory Visit | Attending: Family Medicine | Admitting: Family Medicine

## 2015-08-02 DIAGNOSIS — R1011 Right upper quadrant pain: Secondary | ICD-10-CM | POA: Diagnosis present

## 2015-08-13 HISTORY — PX: CERVICAL DISC SURGERY: SHX588

## 2015-09-05 ENCOUNTER — Telehealth: Payer: Self-pay | Admitting: Family Medicine

## 2015-09-05 MED ORDER — ESOMEPRAZOLE MAGNESIUM 40 MG PO CPDR: 40.0000 mg | DELAYED_RELEASE_CAPSULE | Freq: Every day | ORAL | Status: DC

## 2015-09-05 NOTE — Telephone Encounter (Signed)
Patient is calling to say he needs rx for nexium it is working great, but also wants to know if he still should take 2 of these Please call him and let him know the quantity and the pharmacy is [facility] [place]  [phone] (H)

## 2015-09-05 NOTE — Telephone Encounter (Signed)
Patient is supposed to be on Nexium 40mg  QD, but has been taking OTC Nexium 20mg  BID.   Prescription sent to pharmacy for Nexium 40mg .

## 2015-10-22 ENCOUNTER — Other Ambulatory Visit: Payer: Self-pay | Admitting: *Deleted

## 2015-10-22 MED ORDER — AMLODIPINE BESYLATE 10 MG PO TABS: 10.0000 mg | ORAL_TABLET | Freq: Every day | ORAL | Status: DC

## 2015-10-22 MED ORDER — TAMSULOSIN HCL 0.4 MG PO CAPS
0.4000 mg | ORAL_CAPSULE | Freq: Every day | ORAL | Status: DC
Start: 2015-10-22 — End: 2016-01-20

## 2015-10-22 NOTE — Telephone Encounter (Signed)
Received fax requesting refill on Amlodipine and Flomax.   Refill appropriate and filled per protocol.

## 2016-01-20 ENCOUNTER — Other Ambulatory Visit: Payer: Self-pay | Admitting: Family Medicine

## 2016-04-19 ENCOUNTER — Other Ambulatory Visit: Payer: Self-pay | Admitting: Family Medicine

## 2016-07-03 ENCOUNTER — Ambulatory Visit: Payer: BLUE CROSS/BLUE SHIELD | Admitting: Family Medicine

## 2016-07-07 ENCOUNTER — Ambulatory Visit (INDEPENDENT_AMBULATORY_CARE_PROVIDER_SITE_OTHER): Payer: BLUE CROSS/BLUE SHIELD | Admitting: Family Medicine

## 2016-07-07 ENCOUNTER — Encounter: Payer: Self-pay | Admitting: Family Medicine

## 2016-07-07 VITALS — BP 142/76 | HR 68 | Temp 98.4°F | Resp 16 | Ht 67.0 in | Wt 135.0 lb

## 2016-07-07 DIAGNOSIS — I10 Essential (primary) hypertension: Secondary | ICD-10-CM

## 2016-07-07 DIAGNOSIS — M5412 Radiculopathy, cervical region: Secondary | ICD-10-CM

## 2016-07-07 DIAGNOSIS — M542 Cervicalgia: Secondary | ICD-10-CM

## 2016-07-07 NOTE — Patient Instructions (Signed)
Get xray  F/U as previous for physical

## 2016-07-07 NOTE — Progress Notes (Signed)
   Subjective:    Patient ID: Kyle Rivers, male    DOB: [birth year], 61 y.o.   MRN: [id number]  Patient presents for Neck Pain (x1 year- states that pain is increasing and has gotten more severe within the last month- radiates down L side while driving)  Patient here with neck pain which is nonradiating into his left shoulder for the past year. He had intermittent episodes but now becoming more often. He is still quite active. He has had a few instances of some numbness in his left hand. He has been using , using topical anti-inflammatory and a special tea that has anti-inflammatory properties. Family history with DDD cervical spine requiring surgery -3 of his sisters have had surgery on her neck and her symptoms started just like his. He is not had any injury to his neck.  He is currently being followed by his oral surgeon he is currently on antibiotics as he is having to have dental implants placed. He is also on pain medication for this and this tends to run his blood pressure up.   Review Of Systems:  GEN- denies fatigue, fever, weight loss,weakness, recent illness HEENT- denies eye drainage, change in vision, nasal discharge, CVS- denies chest pain, palpitations RESP- denies SOB, cough, wheeze ABD- denies N/V, change in stools, abd pain GU- denies dysuria, hematuria, dribbling, incontinence MSK- + joint pain, muscle aches, injury Neuro- denies headache, dizziness, syncope, seizure activity       Objective:    BP (!) 142/76   Pulse 68   Temp 98.4 F (36.9 C) (Oral)   Resp 16   Ht 5\' 7"  (1.702 m)   Wt 135 lb (61.2 kg)   SpO2 98%   BMI 21.14 kg/m  GEN- NAD, alert and oriented x3 HEENT- PERRL, EOMI, non injected sclera, pink conjunctiva, MMM, oropharynx clear Neck- Supple, no thyromegaly, TTP C spine near C5/6, neg spurlings, fair ROM  CVS- RRR, no murmur RESP-CTAB MSL- Rotator cuff in tact, biceps in tact, neg empty can Neuro- DTR symmetric, normal tone, sensation  grossly in tact hands, a little decrease in monofilament on index/middle finger left hand, motor equal bilat UE  EXT- No edema Pulses- Radial - 2+        Assessment & Plan:      Problem List Items Addressed This Visit    Essential hypertension    Mild elevation will recheck at CPE in 1 month Continue norvasc       Other Visit Diagnoses    Neck pain    -  Primary   Concern for pain with radicular history, likley DDD/facet arthritis causing pinched nerve, start with xrays of c spine, I think he will need MRI C spine   Relevant Orders   DG Cervical Spine Complete   Left cervical radiculopathy       continue topical anti-inflammatory for now on meds per dentist as well    Relevant Orders   DG Cervical Spine Complete      Note: This dictation was prepared with Dragon dictation along with smaller phrase technology. Any transcriptional errors that result from this process are unintentional.

## 2016-07-07 NOTE — Assessment & Plan Note (Signed)
Mild elevation will recheck at CPE in 1 month Continue norvasc

## 2016-07-09 ENCOUNTER — Ambulatory Visit (HOSPITAL_COMMUNITY)
Admission: RE | Admit: 2016-07-09 | Discharge: 2016-07-09 | Disposition: A | Discharge status: Home / Self Care | Payer: BLUE CROSS/BLUE SHIELD | Source: Ambulatory Visit | Attending: Family Medicine | Admitting: Family Medicine

## 2016-07-09 DIAGNOSIS — M5412 Radiculopathy, cervical region: Secondary | ICD-10-CM

## 2016-07-09 DIAGNOSIS — M542 Cervicalgia: Secondary | ICD-10-CM

## 2016-07-09 DIAGNOSIS — M4722 Other spondylosis with radiculopathy, cervical region: Secondary | ICD-10-CM | POA: Diagnosis not present

## 2016-07-09 DIAGNOSIS — I6523 Occlusion and stenosis of bilateral carotid arteries: Secondary | ICD-10-CM | POA: Insufficient documentation

## 2016-07-10 ENCOUNTER — Other Ambulatory Visit: Payer: Self-pay | Admitting: *Deleted

## 2016-07-10 DIAGNOSIS — M5030 Other cervical disc degeneration, unspecified cervical region: Secondary | ICD-10-CM

## 2016-07-10 DIAGNOSIS — I6523 Occlusion and stenosis of bilateral carotid arteries: Secondary | ICD-10-CM

## 2016-07-14 ENCOUNTER — Telehealth: Payer: Self-pay | Admitting: *Deleted

## 2016-07-14 NOTE — Telephone Encounter (Signed)
Received VM from patient.   Inquired as to scheduling of MRI.   Please contact patient to discuss.

## 2016-07-15 NOTE — Telephone Encounter (Signed)
Able to schedule carotid dopplers and pt aware of appt.  Still working on getting PA for MRI, delayed due to weather in the [place]

## 2016-07-20 ENCOUNTER — Ambulatory Visit (HOSPITAL_COMMUNITY)
Admission: RE | Admit: 2016-07-20 | Discharge: 2016-07-20 | Disposition: A | Discharge status: Home / Self Care | Payer: BLUE CROSS/BLUE SHIELD | Source: Ambulatory Visit | Attending: Family Medicine | Admitting: Family Medicine

## 2016-07-20 DIAGNOSIS — I6523 Occlusion and stenosis of bilateral carotid arteries: Secondary | ICD-10-CM | POA: Insufficient documentation

## 2016-07-20 DIAGNOSIS — M5030 Other cervical disc degeneration, unspecified cervical region: Secondary | ICD-10-CM | POA: Diagnosis present

## 2016-07-20 DIAGNOSIS — M4802 Spinal stenosis, cervical region: Secondary | ICD-10-CM | POA: Insufficient documentation

## 2016-07-21 ENCOUNTER — Ambulatory Visit (HOSPITAL_COMMUNITY): Admission: RE | Admit: 2016-07-21 | Payer: BLUE CROSS/BLUE SHIELD | Source: Ambulatory Visit

## 2016-07-21 ENCOUNTER — Other Ambulatory Visit: Payer: Self-pay | Admitting: Family Medicine

## 2016-07-23 ENCOUNTER — Other Ambulatory Visit: Payer: Self-pay | Admitting: *Deleted

## 2016-07-23 DIAGNOSIS — M5030 Other cervical disc degeneration, unspecified cervical region: Secondary | ICD-10-CM

## 2016-07-23 DIAGNOSIS — M4802 Spinal stenosis, cervical region: Secondary | ICD-10-CM

## 2016-07-27 ENCOUNTER — Ambulatory Visit (INDEPENDENT_AMBULATORY_CARE_PROVIDER_SITE_OTHER): Payer: BLUE CROSS/BLUE SHIELD

## 2016-07-27 ENCOUNTER — Ambulatory Visit (INDEPENDENT_AMBULATORY_CARE_PROVIDER_SITE_OTHER): Payer: BLUE CROSS/BLUE SHIELD | Admitting: Orthopaedic Surgery

## 2016-07-27 ENCOUNTER — Encounter (INDEPENDENT_AMBULATORY_CARE_PROVIDER_SITE_OTHER): Payer: Self-pay | Admitting: Orthopaedic Surgery

## 2016-07-27 VITALS — BP 150/92 | HR 80 | Ht 67.0 in | Wt 140.0 lb

## 2016-07-27 DIAGNOSIS — M4802 Spinal stenosis, cervical region: Secondary | ICD-10-CM

## 2016-07-27 DIAGNOSIS — G8929 Other chronic pain: Secondary | ICD-10-CM | POA: Diagnosis not present

## 2016-07-27 DIAGNOSIS — M47812 Spondylosis without myelopathy or radiculopathy, cervical region: Secondary | ICD-10-CM | POA: Diagnosis not present

## 2016-07-27 DIAGNOSIS — M5442 Lumbago with sciatica, left side: Secondary | ICD-10-CM

## 2016-07-27 MED ORDER — PREDNISONE 10 MG (21) PO TBPK: ORAL_TABLET | ORAL | 0 refills | Status: DC

## 2016-07-27 NOTE — Progress Notes (Signed)
Office Visit Note/orthopedic consultation requesting physician Dr. Buelah Manis   Patient: Kyle Rivers           Date of Birth: 07-15-1955           MRN: [id number] Visit Date: 07/27/2016              Requested by: Alycia Rossetti, MD [address] [place], [place] [address] PCP: Vic Blackbird, MD   Assessment & Plan: Visit Diagnoses:  1. Chronic bilateral low back pain with left-sided sciatica   2. Spondylosis without myelopathy or radiculopathy, cervical region   3. Spinal stenosis of cervical region     Plan: Patient has some mild hyperreflexia lower extremities and left triceps weakness consistent with radiculopathy with significant C5-6 and C6-7 spondylosis with stenosis and severe foraminal narrowing left C5-6 and C6-7. We'll place him on a prednisone pack for some home cervical traction. Recheck 4 weeks. We discussed surgical decompression and anterior cervical fusion at his 2 most severe levels. He has mild to moderate changes at other levels including C4-5. Home cervical traction given then the instructions given for usage. Prednisone pack. Recheck 4 weeks. If he develops increasing weakness he will contact us promptly. Thank for the opportunity to see patient in consultation.``````````````````````````````````````````````````  Follow-Up Instructions: Return in about 4 weeks (around 08/24/2016).   Orders:  Orders Placed This Encounter  Procedures  . XR Lumbar Spine 2-3 Views   Meds ordered this encounter  Medications  . predniSONE (STERAPRED UNI-PAK 21 TAB) 10 MG (21) TBPK tablet    Sig: Take as instructed with food    Dispense:  21 tablet    Refill:  0      Procedures: No procedures performed   Clinical Data: No additional findings. Patient has several sisters with cervical fusions from spondylosis.  Subjective: Chief Complaint  Patient presents with  . Neck - Pain  . Lower Back - Pain    Patient presents today with neck and low back pain. These are  both chronic issues, but seem to be getting worse. He describes posterior neck pain that gets worse when he drives. He can feel the pain when turning his head to one side or the other and can hear the bones in his spine grinding with certain movements. He also feels some pain in his left fingers. He has had recent cervical spine x-rays and MRI that are on [facility].  He also has complaint with chronic low back pain that radiates down his left leg all of the way to his foot. He notices this is worse after standing for long periods of time. He does have a history of a cracked tailbone about 15 years ago. He has not had recent lumbar spine imaging.   Neck pain has been worse he is noticing weakness in his left arm with pushups. He is able to walk up 2 steps without any leg weakness. 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  Review of Systems  Constitutional: Negative for chills and diaphoresis.  HENT: Negative for ear discharge, ear pain and nosebleeds.   Eyes: Negative for discharge and visual disturbance.  Respiratory: Negative for cough, choking and shortness of breath.        Used to be a half pack per day smoker now scabbed back to quarter pack a day.  Cardiovascular: Negative for chest pain and palpitations.       Positive for hypertension and increased cholesterol.  Gastrointestinal: Negative for abdominal  distention and abdominal pain.  Endocrine: Negative for cold intolerance and heat intolerance.  Genitourinary: Negative for flank pain and hematuria.       Hemorrhoid surgery 2010 did well.  Musculoskeletal:       Positive for left arm weakness bilateral neck and shoulder pain some numbness in his hands. Sometimes bothers him at night. He said some chronic back pain. Neck pain symptoms of been present for at least 10 years. Previous MRI lumbar 2010 showed some lumbar facet degenerative changes and narrowing at L5-S1. Cervical MRI 07/20/2016 shows moderate to severe spondylosis with  stenosis and severe left foraminal stenosis C5-6 and C6-7. Mild changes at other levels.  Skin: Negative for rash and wound.  Neurological: Positive for numbness. Negative for seizures and speech difficulty.  Hematological: Negative for adenopathy. Does not bruise/bleed easily.  Psychiatric/Behavioral: Negative for agitation and suicidal ideas.     Objective: Vital Signs: BP (!) 150/92   Pulse 80   Ht 5\' 7"  (1.702 m)   Wt 140 lb (63.5 kg)   BMI 21.93 kg/m   Physical Exam  Constitutional: He is oriented to person, place, and time. He appears well-developed and well-nourished.  HENT:  Head: Normocephalic and atraumatic.  Eyes: EOM are normal. Pupils are equal, round, and reactive to light.  Neck: No tracheal deviation present. No thyromegaly present.  Cardiovascular: Normal rate.   Pulmonary/Chest: Effort normal. He has no wheezes.  Abdominal: Soft. Bowel sounds are normal.  Musculoskeletal:  Patient has positive Spurling increased pain with cervical compression some improvement with cervical distraction. Upper extremities show 2+ biceps and 2+ brachioradialis right and left. He has absent left triceps. There is mild triceps atrophy on the left weakness with resisted manual testing. Lower extremity show 3+ reflexes with some hyperreflexia but no clonus. He is able to heel and toe walk. Mild sciatic notch tenderness and some tenderness with lumbar palpation.  Neurological: He is alert and oriented to person, place, and time.  Skin: Skin is warm and dry. Capillary refill takes less than 2 seconds.  Psychiatric: He has a normal mood and affect. His behavior is normal. Judgment and thought content normal.    Ortho Exam  Specialty Comments:  No specialty comments available.  Imaging: Xr Lumbar Spine 2-3 Views  Result Date: 07/27/2016 AP lateral lumbar spine x-rays obtained. This shows some spondylosis with narrowing L5-S1. Facet arthropathy at L4-5 and L5-S1. No evidence of fracture.  Impression: Lumbar facet degenerative changes L4-5 L5-S1. Similar to previous MRI lumbar, 2010    PMFS History: Patient Active Problem List   Diagnosis Date Noted  . Spinal stenosis of cervical region 07/27/2016  . 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 Vision problem 04/02/2014  . Decreased visual acuity 04/02/2014  . BPH (benign prostatic hypertrophy) 07/31/2013  . Abdominal pain, unspecified site 06/25/2013  . Scrotal pain 06/25/2013  . ED (erectile dysfunction) 11/29/2012  . Hyperlipidemia 04/05/2006  . Essential hypertension 04/05/2006  . ABNORMAL HEART RHYTHMS 04/05/2006  . LOW BACK PAIN 04/05/2006   Past Medical History:  Diagnosis Date  . BPH (benign prostatic hypertrophy)   . 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 Hemorrhoids   . Hyperlipidemia   . Hypertension   . LVH (left ventricular hypertrophy)    LVH variant, EKG show ST elevation, normal cardiac catherization 2009  . Prostatitis     Family History  Problem Relation Age of Onset  . Stroke Mother   .  Hypertension Father   . Diabetes Father   . Diabetes Sister   . Diabetes Sister     Past Surgical History:  Procedure Laterality Date  . HEMORRHOID SURGERY     Social History   Occupational History  . Not on file.   Social History Main Topics  . Smoking status: Former Research scientist (life sciences)  . Smokeless tobacco: Former Systems developer  . Alcohol use Yes     Comment: Occ  . Drug use: No  . Sexual activity: Yes

## 2016-07-28 ENCOUNTER — Encounter: Payer: Self-pay | Admitting: Family Medicine

## 2016-07-28 ENCOUNTER — Ambulatory Visit (INDEPENDENT_AMBULATORY_CARE_PROVIDER_SITE_OTHER): Payer: BLUE CROSS/BLUE SHIELD | Admitting: Family Medicine

## 2016-07-28 VITALS — BP 134/70 | HR 66 | Temp 98.9°F | Resp 14 | Ht 67.0 in | Wt 136.0 lb

## 2016-07-28 DIAGNOSIS — Z0000 Encounter for general adult medical examination without abnormal findings: Secondary | ICD-10-CM

## 2016-07-28 DIAGNOSIS — K921 Melena: Secondary | ICD-10-CM

## 2016-07-28 DIAGNOSIS — I779 Disorder of arteries and arterioles, unspecified: Secondary | ICD-10-CM | POA: Diagnosis not present

## 2016-07-28 DIAGNOSIS — E782 Mixed hyperlipidemia: Secondary | ICD-10-CM | POA: Diagnosis not present

## 2016-07-28 DIAGNOSIS — Z23 Encounter for immunization: Secondary | ICD-10-CM

## 2016-07-28 DIAGNOSIS — Z125 Encounter for screening for malignant neoplasm of prostate: Secondary | ICD-10-CM

## 2016-07-28 DIAGNOSIS — Z1211 Encounter for screening for malignant neoplasm of colon: Secondary | ICD-10-CM | POA: Diagnosis not present

## 2016-07-28 DIAGNOSIS — N400 Enlarged prostate without lower urinary tract symptoms: Secondary | ICD-10-CM | POA: Diagnosis not present

## 2016-07-28 DIAGNOSIS — Z1159 Encounter for screening for other viral diseases: Secondary | ICD-10-CM | POA: Diagnosis not present

## 2016-07-28 DIAGNOSIS — I739 Peripheral vascular disease, unspecified: Secondary | ICD-10-CM

## 2016-07-28 LAB — CBC WITH DIFFERENTIAL/PLATELET
BASOS ABS: 0 {cells}/uL (ref 0–200)
Basophils Relative: 0 %
Eosinophils Absolute: 0 cells/uL — ABNORMAL LOW (ref 15–500)
Eosinophils Relative: 0 %
HCT: 45.6 % (ref 38.5–50.0)
HEMOGLOBIN: 15.9 g/dL (ref 13.0–17.0)
LYMPHS PCT: 18 %
Lymphs Abs: 1656 cells/uL (ref 850–3900)
MCH: 33 pg (ref 27.0–33.0)
MCHC: 34.9 g/dL (ref 32.0–36.0)
MCV: 94.6 fL (ref 80.0–100.0)
MONO ABS: 828 {cells}/uL (ref 200–950)
MPV: 11.2 fL (ref 7.5–12.5)
Monocytes Relative: 9 %
NEUTROS PCT: 73 %
Neutro Abs: 6716 cells/uL (ref 1500–7800)
Platelets: 187 10*3/uL (ref 140–400)
RBC: 4.82 MIL/uL (ref 4.20–5.80)
RDW: 14.5 % (ref 11.0–15.0)
WBC: 9.2 10*3/uL (ref 3.8–10.8)

## 2016-07-28 LAB — LIPID PANEL
CHOL/HDL RATIO: 3.1 ratio (ref <5.0)
Cholesterol: 274 mg/dL — ABNORMAL HIGH (ref <200)
HDL: 89 mg/dL (ref >40)
LDL CALC: 174 mg/dL — AB (ref <100)
Triglycerides: 53 mg/dL (ref <150)
VLDL: 11 mg/dL (ref <30)

## 2016-07-28 LAB — COMPREHENSIVE METABOLIC PANEL
ALT: 11 U/L (ref 9–46)
AST: 18 U/L (ref 10–35)
Albumin: 4.8 g/dL (ref 3.6–5.1)
Alkaline Phosphatase: 88 U/L (ref 40–115)
BILIRUBIN TOTAL: 0.6 mg/dL (ref 0.2–1.2)
BUN: 12 mg/dL (ref 7–25)
CO2: 18 mmol/L — AB (ref 20–31)
Calcium: 10.2 mg/dL (ref 8.6–10.3)
Chloride: 104 mmol/L (ref 98–110)
Creat: 1.17 mg/dL (ref 0.70–1.25)
GLUCOSE: 104 mg/dL — AB (ref 70–99)
Potassium: 4.8 mmol/L (ref 3.5–5.3)
Sodium: 140 mmol/L (ref 135–146)
Total Protein: 8.4 g/dL — ABNORMAL HIGH (ref 6.1–8.1)

## 2016-07-28 NOTE — Progress Notes (Signed)
   Subjective:    Patient ID: Kyle Rivers, male    DOB: March 03, 1956, 61 y.o.   MRN: [id number]  Patient presents for CPE (is fasting) Here for complete physical exam. 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  He also complains of swelling over the anal on his left temple like region. He states that he put some type of anti-inflammatory cream on it and it started to go down. He did not injure his head he had some soreness right around it but no severe headache no change in vision. Note he is also now on prednisone taper given by his neurosurgeon.  He is due for colonoscopy.  He has carotid artery disease and known hyperlipidemia. He has had reactions with statins but is willing to try different cholesterol medication to help lower them. He is due for repeat carotid ultrasound in one year.  Due for PSA screening and fasting labs.   Immunizations due for shingles vaccine and tetanus booster    Review Of Systems:  GEN- denies fatigue, fever, weight loss,weakness, recent illness HEENT- denies eye drainage, change in vision, nasal discharge, CVS- denies chest pain, palpitations RESP- denies SOB, cough, wheeze ABD- denies N/V, change in stools, abd pain GU- denies dysuria, hematuria, dribbling, incontinence MSK- denies joint pain, muscle aches, injury Neuro- denies headache, dizziness, syncope, seizure activity       Objective:    BP 134/70   Pulse 66   Temp 98.9 F (37.2 C) (Oral)   Resp 14   Ht 5\' 7"  (1.702 m)   Wt 136 lb (61.7 kg)   SpO2 99%   BMI 21.30 kg/m  GEN- NAD, alert and oriented x3 HEENT- PERRL, EOMI, non injected sclera, pink conjunctiva, MMM, oropharynx clear, promient veins on head/scalp, areas of concern is where vein terminates mild swelling compared to others, no induration or clot palpated ,NT TM clear no effusion, nares clear  Neck- Supple, no  thyromegaly CVS- RRR, no murmur RESP-CTAB ABD-NABS,soft,NT,ND GU- rectum- enlarged prostate, no nodules, FOBT positive  EXT- No edema Pulses- Radial, DP- 2+        Assessment & Plan:      Problem List Items Addressed This Visit    Hyperlipidemia   Carotid artery disease (HCC)    Trial of tricor for cholesterol Repeat in 1 year      Relevant Orders   Lipid panel (Completed)   Benign prostatic hyperplasia    Other Visit Diagnoses    Routine general medical examination at a health care facility    -  Primary   CPE done, fasting labs, referral to GI for blood in stool and colonoscopy. Given TDAP, shingles vaccine sent to pharmacy   Relevant Orders   CBC with Differential/Platelet (Completed)   Comprehensive metabolic panel (Completed)   Vitamin D, 25-hydroxy (Completed)   Blood in stool       Prostate cancer screening       Relevant Orders   PSA (Completed)   Encounter for hepatitis C screening test for low risk patient       Relevant Orders   Hepatitis C antibody (Completed)   Need for Tdap vaccination          Note: This dictation was prepared with Dragon dictation along with smaller phrase technology. Any transcriptional errors that result from this process are unintentional.

## 2016-07-28 NOTE — Patient Instructions (Addendum)
SHINGLES vaccine sent to pharmacy  We will call with labs  I will look into your colonoscopy report for the blood in the stool Continue current medication Start Tricor for your cholesterol  F/U 6 months

## 2016-07-29 LAB — HEPATITIS C ANTIBODY: HCV Ab: NEGATIVE

## 2016-07-29 LAB — VITAMIN D 25 HYDROXY (VIT D DEFICIENCY, FRACTURES): Vit D, 25-Hydroxy: 20 ng/mL — ABNORMAL LOW (ref 30–100)

## 2016-07-29 LAB — PSA: PSA: 1.3 ng/mL (ref <=4.0)

## 2016-07-29 MED ORDER — ZOSTER VACCINE LIVE 19400 UNT/0.65ML ~~LOC~~ SUSR: 0.6500 mL | Freq: Once | SUBCUTANEOUS | 0 refills | Status: AC

## 2016-07-29 MED ORDER — FENOFIBRATE 48 MG PO TABS: 48.0000 mg | ORAL_TABLET | Freq: Every day | ORAL | 6 refills | Status: DC

## 2016-07-29 NOTE — Addendum Note (Signed)
Addended by: Sheral Flow on: 07/29/2016 03:03 PM   Modules accepted: Orders

## 2016-07-29 NOTE — Assessment & Plan Note (Signed)
Trial of tricor for cholesterol Repeat in 1 year

## 2016-07-30 ENCOUNTER — Encounter (INDEPENDENT_AMBULATORY_CARE_PROVIDER_SITE_OTHER): Payer: Self-pay | Admitting: Internal Medicine

## 2016-08-03 ENCOUNTER — Telehealth (INDEPENDENT_AMBULATORY_CARE_PROVIDER_SITE_OTHER): Payer: Self-pay | Admitting: Orthopaedic Surgery

## 2016-08-03 NOTE — Telephone Encounter (Signed)
u can have in come back this week, we will do a note and then can seek approval. ucall thanks

## 2016-08-03 NOTE — Telephone Encounter (Signed)
Please advise. Do you want me to bring patient in sooner or go ahead and schedule for surgery?

## 2016-08-03 NOTE — Telephone Encounter (Signed)
Pt called and stated that he has talked with his insurance company and they said they will approve his surgery. He is retiring in April and at the end of April he will lose his insurance coverage. Pt is wanting to know if his appt can be moved up and if surgery can be scheduled before the end of April? CB# [phone].

## 2016-08-03 NOTE — Telephone Encounter (Signed)
Can you please put patient on Dr. Lorin Mercy schedule at 4:15p tomorrow afternoon? He is aware of appt. It is to discuss cervical spine surgery. Thanks.

## 2016-08-03 NOTE — Telephone Encounter (Signed)
Done

## 2016-08-04 ENCOUNTER — Encounter (INDEPENDENT_AMBULATORY_CARE_PROVIDER_SITE_OTHER): Payer: Self-pay | Admitting: Orthopaedic Surgery

## 2016-08-04 ENCOUNTER — Ambulatory Visit (INDEPENDENT_AMBULATORY_CARE_PROVIDER_SITE_OTHER): Payer: BLUE CROSS/BLUE SHIELD | Admitting: Orthopaedic Surgery

## 2016-08-04 VITALS — BP 150/88 | HR 100 | Ht 67.0 in | Wt 140.0 lb

## 2016-08-04 DIAGNOSIS — M47812 Spondylosis without myelopathy or radiculopathy, cervical region: Secondary | ICD-10-CM

## 2016-08-04 DIAGNOSIS — M4802 Spinal stenosis, cervical region: Secondary | ICD-10-CM | POA: Diagnosis not present

## 2016-08-04 NOTE — Progress Notes (Signed)
Office Visit Note   Patient: Kyle Rivers           Date of Birth: 1955-07-13           MRN: [id number] Visit Date: 08/04/2016              Requested by: Alycia Rossetti, MD [address] [place], [place] [address] PCP: Vic Blackbird, MD   Assessment & Plan: Visit Diagnoses:  1. Spondylosis without myelopathy or radiculopathy, cervical region   2. Spinal stenosis of cervical region            C5-6, C6-7  Plan: Patient has used cervical traction and taken anti-inflammatories. He set persistent symptoms. He gets temporary improvement with the prednisone pack and then recurrence of pain. He's noticed some weakness in his legs and states he like to proceed with two-level cervical fusion at this point. He's had symptoms for more than a year and he has moderate central stenosis at C5-6, C6-7 with severe foraminal stenosis at that level to correspond with his left arm symptoms and left triceps weakness. Patient has combination of early myelopathy changes at the beginning of some leg weakness and radiculopathy on the left with triceps weakness. Procedure discussed for two-level cervical fusion overnight stay in the hospital. He'll be in a collar soft cervical for 6 weeks after the surgery. We discussed dysphonia, dysphasia, potential for pseudoarthrosis which could be symptomatic. We discussed the potential for progression of his cervical spondylosis at other levels at some point in the future. He has mild to moderate changes at C4-5 but does not need that level surgically treated at this time. Questions were elicited and answered he understands and would like to proceed with scheduling.  Follow-Up Instructions: We'll seek approval for two-level cervical fusion overnight stay and his office visit will be one week after surgery.  Orders:  No orders of the defined types were placed in this encounter.  No orders of the defined types were placed in this encounter.     Procedures: No  procedures performed   Clinical Data: No additional findings.   Subjective: No chief complaint on file.   Patient returns to discuss cervical spine surgery. He has spoken with his insurance company who states that they will cover this if it is what is needed. He would like to schedule as soon as possible.    Review of Systems   Constitutional: Negative for chills and diaphoresis.  HENT: Negative for ear discharge, ear pain and nosebleeds.   Eyes: Negative for discharge and visual disturbance.  Respiratory: Negative for cough, choking and shortness of breath.        Used to be a half pack per day smoker now scabbed back to quarter pack a day.  Cardiovascular: Negative for chest pain and palpitations.       Positive for hypertension and increased cholesterol.  Gastrointestinal: Negative for abdominal distention and abdominal pain.  Endocrine: Negative for cold intolerance and heat intolerance.  Genitourinary: Negative for flank pain and hematuria.       Hemorrhoid surgery 2010 did well.  Musculoskeletal:       Positive for left arm weakness bilateral neck and shoulder pain some numbness in his hands. Sometimes bothers him at night. He said some chronic back pain. Neck pain symptoms of been present for at least 10 years. Previous MRI lumbar 2010 showed some lumbar facet degenerative changes and narrowing at L5-S1. Cervical MRI 07/20/2016 shows moderate to severe spondylosis  with stenosis and severe left foraminal stenosis C5-6 and C6-7. Mild changes at other levels.  Skin: Negative for rash and wound.  Neurological: Positive for numbness. Negative for seizures and speech difficulty.  Hematological: Negative for adenopathy. Does not bruise/bleed easily.  Psychiatric/Behavioral: Negative for agitation and suicidal ideas.     Objective: Vital Signs: BP (!) 150/88   Pulse 100   Ht 5\' 7"  (1.702 m)   Wt 140 lb (63.5 kg)   BMI 21.93 kg/m   Physical Exam  Constitutional: He is  oriented to person, place, and time. He appears well-developed and well-nourished.  HENT:  Head: Normocephalic and atraumatic.  Eyes: EOM are normal. Pupils are equal, round, and reactive to light.  Neck: No tracheal deviation present. No thyromegaly present.  Cardiovascular: Normal rate.   Pulmonary/Chest: Effort normal. He has no wheezes.  Abdominal: Soft. Bowel sounds are normal.  Neurological: He is alert and oriented to person, place, and time.  Skin: Skin is warm and dry. Capillary refill takes less than 2 seconds.  Psychiatric: He has a normal mood and affect. His behavior is normal. Judgment and thought content normal.   patient has brought positive Spurling test on the left. Some improvement in his pain with cervical distraction. Absent triceps reflex. Left triceps atrophy with triceps weakness. 3+ lower extremity reflexes without sustained clonus. He can heel and toe walk. He's noticed some occasional balance problems. Negative straight leg raise and lower extremities right and left. Interossei testing finger extensors are strong.  Ortho Exam  Specialty Comments:  No specialty comments available.  Imaging: Study Result   CLINICAL DATA:  Neck pain radiating to the left shoulder.  EXAM: MRI CERVICAL SPINE WITHOUT CONTRAST  TECHNIQUE: Multiplanar, multisequence MR imaging of the cervical spine was performed. No intravenous contrast was administered.  COMPARISON:  Cervical spine radiographs 07/09/2016  FINDINGS: Alignment: Normal aside from straightening of the normal cervical lordosis.  Vertebrae: Degenerative endplate signal changes at C4-C5, C5-C6 and C6-C7 with associated anterior and posterior osteophytosis. No focal marrow lesions. No evidence of discitis osteomyelitis. No epidural collection.  Cord: Normal caliber and signal.  Posterior Fossa, vertebral arteries, paraspinal tissues: Visualized posterior fossa is normal. Vertebral artery flow voids  are preserved. Normal visualized paraspinal soft tissues.  Disc levels:  C1-C2: Mild degenerative change.  C2-C3: Bilateral mild uncovertebral hypertrophy and left-greater-than-right facet hypertrophy with mild left neural foraminal narrowing. No spinal canal stenosis.  C3-C4: Moderate right facet hypertrophy and mild right uncovertebral spurring results and moderate right foraminal narrowing. No spinal canal stenosis.  C4-C5: Medium-sized disc osteophyte complex narrows the ventral thecal sac without causing spinal canal stenosis. Moderate narrowing of the left neural foramen.  C5-C6: Disc osteophyte complex with superimposed medium-sized left foraminal/extraforaminal protrusion. This severely narrows the left neural foramen. There is moderate central spinal canal stenosis with effacement of the ventral thecal sac.  C6-C7: Severe disc space loss with medium-sized disc osteophyte complex with superimposed small central extrusion with inferior migration. The ventral thecal sac is narrowed and there is moderate spinal canal stenosis. There is moderate right and severe left neural foraminal stenosis.  C7-T1: Normal disc space and facets. No spinal canal or neuroforaminal stenosis.  IMPRESSION: 1. Predominantly lower cervical degenerative disc disease with moderate spinal canal stenosis of C5-C6 and C6-C7. 2. Severe left C5-C6 and C6-C7 neural foraminal stenosis. 3. Moderate right C3-C4, left C4-C5 and right C6-C7 neural foraminal stenosis.   Electronically Signed   By: Ulyses Jarred M.D.   On:  07/20/2016 14:07       PMFS History: Patient Active Problem List   Diagnosis Date Noted  . Carotid artery disease ([place]) 07/28/2016  . Spinal stenosis of cervical region 07/27/2016  . 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 Benign prostatic hyperplasia 07/31/2013  . Abdominal pain, unspecified site 06/25/2013  . Scrotal pain  06/25/2013  . ED (erectile dysfunction) 11/29/2012  . Hyperlipidemia 04/05/2006  . Essential hypertension 04/05/2006  . ABNORMAL HEART RHYTHMS 04/05/2006  . LOW BACK PAIN 04/05/2006   Past Medical History:  Diagnosis Date  . BPH (benign prostatic hypertrophy)   . 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 Hemorrhoids   . Hyperlipidemia   . Hypertension   . LVH (left ventricular hypertrophy)    LVH variant, EKG show ST elevation, normal cardiac catherization 2009  . Prostatitis     Family History  Problem Relation Age of Onset  . Stroke Mother   . Hypertension Father   . Diabetes Father   . Diabetes Sister   . Diabetes Sister     Past Surgical History:  Procedure Laterality Date  . HEMORRHOID SURGERY     Social History   Occupational History  . Not on file.   Social History Main Topics  . Smoking status: Former Research scientist (life sciences)  . Smokeless tobacco: Former Systems developer  . Alcohol use Yes     Comment: Occ  . Drug use: No  . Sexual activity: Yes

## 2016-08-06 ENCOUNTER — Other Ambulatory Visit (INDEPENDENT_AMBULATORY_CARE_PROVIDER_SITE_OTHER): Payer: Self-pay | Admitting: Orthopaedic Surgery

## 2016-08-06 DIAGNOSIS — M4802 Spinal stenosis, cervical region: Secondary | ICD-10-CM

## 2016-08-10 ENCOUNTER — Encounter (HOSPITAL_COMMUNITY): Payer: Self-pay | Admitting: *Deleted

## 2016-08-10 NOTE — Progress Notes (Signed)
Kyle Rivers denies chest pain or discomfort.  Patient was admitted to [facility] 09/09/2007 from PCP's office with abnormal EKG, anterior ST elevation.  Patient had echo and cath, cath was normal.  Patient was given an EKG to carry with him, he no longer has it.  Patient denies having EKG in current PCP's office, I do not find one in epic for Dr Dorian Heckle office

## 2016-08-11 NOTE — Progress Notes (Signed)
Anesthesia Chart Review:  Pt is a same day work up.   Pt is a 61 year old male scheduled for C5-6, C6-7 ACDF on 08/12/2016 with Rodell Perna, MD  PMH includes:  HTN, hyperlipidemia, LVH.  Current smoker. BMI 22  Medications include: Amlodipine, Nexium, fenofibrate  Labs will be obtained DOS  EKG will be obtained DOS.  - Pt reports he had abnormal EKG in 2009 that resulted in cath that was negative (see below). Copy of EKG on paper chart. Note by Eustace Quail, MD with cardiology 10/25/07 indicates EKG with ST elevation likely represents an unusual variation of LVH. Pt was discharged from cardiology.   Carotid duplex 07/20/16:  1. Moderate right carotid bifurcation atherosclerotic plaque. Degree of stenosis less than 50%. 2. Mild left carotid bifurcation proximal ICA atherosclerotic vascular plaque. Degree of stenosis less than 50%. 3. Vertebral arteries are patent antegrade flow.   Cardiac cath 09/09/07: Normal coronary angiography and left ventricular wall motion.   If labs acceptable DOS and EKG is stable, I anticipate pt can proceed with surgery as scheduled.   Willeen Cass, FNP-BC [facility]/Anesthesiology Phone: [phone] 08/11/2016 2:07 PM

## 2016-08-12 ENCOUNTER — Ambulatory Visit (INDEPENDENT_AMBULATORY_CARE_PROVIDER_SITE_OTHER): Payer: BLUE CROSS/BLUE SHIELD | Admitting: Orthopaedic Surgery

## 2016-08-12 ENCOUNTER — Encounter (HOSPITAL_COMMUNITY)
Admission: RE | Disposition: A | Discharge status: Home / Self Care | Payer: Self-pay | Source: Ambulatory Visit | Attending: Orthopaedic Surgery

## 2016-08-12 ENCOUNTER — Observation Stay (HOSPITAL_COMMUNITY)
Admission: RE | Admit: 2016-08-12 | Discharge: 2016-08-13 | Disposition: A | Discharge status: Home / Self Care | Payer: BLUE CROSS/BLUE SHIELD | Source: Ambulatory Visit | Attending: Orthopaedic Surgery | Admitting: Orthopaedic Surgery

## 2016-08-12 ENCOUNTER — Ambulatory Visit (HOSPITAL_COMMUNITY): Payer: BLUE CROSS/BLUE SHIELD | Admitting: Emergency Medicine

## 2016-08-12 ENCOUNTER — Ambulatory Visit (INDEPENDENT_AMBULATORY_CARE_PROVIDER_SITE_OTHER): Payer: BLUE CROSS/BLUE SHIELD | Admitting: Internal Medicine

## 2016-08-12 ENCOUNTER — Ambulatory Visit (HOSPITAL_COMMUNITY): Payer: BLUE CROSS/BLUE SHIELD

## 2016-08-12 ENCOUNTER — Encounter (HOSPITAL_COMMUNITY): Payer: Self-pay | Admitting: Certified Registered Nurse Anesthetist

## 2016-08-12 DIAGNOSIS — K649 Unspecified hemorrhoids: Secondary | ICD-10-CM | POA: Diagnosis not present

## 2016-08-12 DIAGNOSIS — Z833 Family history of diabetes mellitus: Secondary | ICD-10-CM | POA: Diagnosis not present

## 2016-08-12 DIAGNOSIS — N400 Enlarged prostate without lower urinary tract symptoms: Secondary | ICD-10-CM | POA: Insufficient documentation

## 2016-08-12 DIAGNOSIS — H6690 Otitis media, unspecified, unspecified ear: Secondary | ICD-10-CM | POA: Diagnosis not present

## 2016-08-12 DIAGNOSIS — Z888 Allergy status to other drugs, medicaments and biological substances status: Secondary | ICD-10-CM | POA: Insufficient documentation

## 2016-08-12 DIAGNOSIS — I739 Peripheral vascular disease, unspecified: Secondary | ICD-10-CM | POA: Insufficient documentation

## 2016-08-12 DIAGNOSIS — E785 Hyperlipidemia, unspecified: Secondary | ICD-10-CM | POA: Diagnosis not present

## 2016-08-12 DIAGNOSIS — Z8249 Family history of ischemic heart disease and other diseases of the circulatory system: Secondary | ICD-10-CM | POA: Insufficient documentation

## 2016-08-12 DIAGNOSIS — M4802 Spinal stenosis, cervical region: Principal | ICD-10-CM

## 2016-08-12 DIAGNOSIS — M5030 Other cervical disc degeneration, unspecified cervical region: Secondary | ICD-10-CM | POA: Insufficient documentation

## 2016-08-12 DIAGNOSIS — M1990 Unspecified osteoarthritis, unspecified site: Secondary | ICD-10-CM | POA: Insufficient documentation

## 2016-08-12 DIAGNOSIS — M47812 Spondylosis without myelopathy or radiculopathy, cervical region: Secondary | ICD-10-CM | POA: Diagnosis not present

## 2016-08-12 DIAGNOSIS — I119 Hypertensive heart disease without heart failure: Secondary | ICD-10-CM | POA: Insufficient documentation

## 2016-08-12 DIAGNOSIS — Z823 Family history of stroke: Secondary | ICD-10-CM | POA: Diagnosis not present

## 2016-08-12 DIAGNOSIS — Z419 Encounter for procedure for purposes other than remedying health state, unspecified: Secondary | ICD-10-CM

## 2016-08-12 DIAGNOSIS — F17200 Nicotine dependence, unspecified, uncomplicated: Secondary | ICD-10-CM | POA: Diagnosis not present

## 2016-08-12 HISTORY — PX: ANTERIOR CERVICAL DECOMP/DISCECTOMY FUSION: SHX1161

## 2016-08-12 HISTORY — DX: Otitis media, unspecified, unspecified ear: H66.90

## 2016-08-12 LAB — COMPREHENSIVE METABOLIC PANEL
ALBUMIN: 4 g/dL (ref 3.5–5.0)
ALT: 12 U/L — ABNORMAL LOW (ref 17–63)
ANION GAP: 9 (ref 5–15)
AST: 20 U/L (ref 15–41)
Alkaline Phosphatase: 67 U/L (ref 38–126)
BUN: 12 mg/dL (ref 6–20)
CALCIUM: 9.3 mg/dL (ref 8.9–10.3)
CO2: 23 mmol/L (ref 22–32)
Chloride: 104 mmol/L (ref 101–111)
Creatinine, Ser: 1.26 mg/dL — ABNORMAL HIGH (ref 0.61–1.24)
GFR calc non Af Amer: >60 mL/min (ref >60)
Glucose, Bld: 104 mg/dL — ABNORMAL HIGH (ref 65–99)
Potassium: 4.6 mmol/L (ref 3.5–5.1)
Sodium: 136 mmol/L (ref 135–145)
TOTAL PROTEIN: 7.8 g/dL (ref 6.5–8.1)
Total Bilirubin: 0.7 mg/dL (ref 0.3–1.2)

## 2016-08-12 LAB — URINALYSIS, ROUTINE W REFLEX MICROSCOPIC
Bilirubin Urine: NEGATIVE
GLUCOSE, UA: NEGATIVE mg/dL
Hgb urine dipstick: NEGATIVE
KETONES UR: NEGATIVE mg/dL
LEUKOCYTES UA: NEGATIVE
NITRITE: NEGATIVE
PROTEIN: NEGATIVE mg/dL
Specific Gravity, Urine: 1.012 (ref 1.005–1.030)
pH: 6 (ref 5.0–8.0)

## 2016-08-12 LAB — CBC
HCT: 45.4 % (ref 39.0–52.0)
Hemoglobin: 15.8 g/dL (ref 13.0–17.0)
MCH: 32.8 pg (ref 26.0–34.0)
MCHC: 34.8 g/dL (ref 30.0–36.0)
MCV: 94.2 fL (ref 78.0–100.0)
PLATELETS: 159 10*3/uL (ref 150–400)
RBC: 4.82 MIL/uL (ref 4.22–5.81)
RDW: 14.3 % (ref 11.5–15.5)
WBC: 7.6 10*3/uL (ref 4.0–10.5)

## 2016-08-12 SURGERY — ANTERIOR CERVICAL DECOMPRESSION/DISCECTOMY FUSION 2 LEVELS
Anesthesia: General | Site: Neck

## 2016-08-12 MED ORDER — HYDROMORPHONE HCL 1 MG/ML IJ SOLN
0.2500 mg | INTRAMUSCULAR | Status: DC | PRN
  Administered 2016-08-12: 0.5 mg via INTRAVENOUS

## 2016-08-12 MED ORDER — ONDANSETRON HCL 4 MG/2ML IJ SOLN
INTRAMUSCULAR | Status: AC
  Filled 2016-08-12: qty 2

## 2016-08-12 MED ORDER — CHLORHEXIDINE GLUCONATE 4 % EX LIQD: 60.0000 mL | Freq: Once | CUTANEOUS | Status: DC

## 2016-08-12 MED ORDER — METHOCARBAMOL 500 MG PO TABS
500.0000 mg | ORAL_TABLET | Freq: Four times a day (QID) | ORAL | Status: DC | PRN
  Administered 2016-08-12: 500 mg via ORAL
  Filled 2016-08-12 (×2): qty 1

## 2016-08-12 MED ORDER — FENTANYL CITRATE (PF) 250 MCG/5ML IJ SOLN
INTRAMUSCULAR | Status: AC
  Filled 2016-08-12: qty 5

## 2016-08-12 MED ORDER — ACETAMINOPHEN 650 MG RE SUPP: 650.0000 mg | RECTAL | Status: DC | PRN

## 2016-08-12 MED ORDER — PHENYLEPHRINE HCL 10 MG/ML IJ SOLN
INTRAMUSCULAR | Status: DC | PRN
  Administered 2016-08-12: 120 ug via INTRAVENOUS
  Administered 2016-08-12: 80 ug via INTRAVENOUS
  Administered 2016-08-12: 120 ug via INTRAVENOUS
  Administered 2016-08-12 (×2): 160 ug via INTRAVENOUS
  Administered 2016-08-12: 120 ug via INTRAVENOUS

## 2016-08-12 MED ORDER — POLYETHYLENE GLYCOL 3350 17 G PO PACK
17.0000 g | PACK | Freq: Every day | ORAL | Status: DC
  Administered 2016-08-13: 17 g via ORAL
  Filled 2016-08-12: qty 1

## 2016-08-12 MED ORDER — PHENOL 1.4 % MT LIQD: 1.0000 | OROMUCOSAL | Status: DC | PRN

## 2016-08-12 MED ORDER — CEFAZOLIN SODIUM-DEXTROSE 2-4 GM/100ML-% IV SOLN
INTRAVENOUS | Status: AC
  Filled 2016-08-12: qty 100

## 2016-08-12 MED ORDER — EPHEDRINE SULFATE 50 MG/ML IJ SOLN
INTRAMUSCULAR | Status: DC | PRN
  Administered 2016-08-12 (×2): 10 mg via INTRAVENOUS

## 2016-08-12 MED ORDER — SUGAMMADEX SODIUM 200 MG/2ML IV SOLN
INTRAVENOUS | Status: AC
  Filled 2016-08-12: qty 2

## 2016-08-12 MED ORDER — GLYCOPYRROLATE 0.2 MG/ML IJ SOLN
INTRAMUSCULAR | Status: DC | PRN
  Administered 2016-08-12: 0.1 mg via INTRAVENOUS

## 2016-08-12 MED ORDER — DEXTROSE 5 % IV SOLN
INTRAVENOUS | Status: DC | PRN
  Administered 2016-08-12: 50 ug/min via INTRAVENOUS

## 2016-08-12 MED ORDER — METHOCARBAMOL 1000 MG/10ML IJ SOLN
500.0000 mg | Freq: Four times a day (QID) | INTRAVENOUS | Status: DC | PRN
  Filled 2016-08-12: qty 5

## 2016-08-12 MED ORDER — ONDANSETRON HCL 4 MG/2ML IJ SOLN: 4.0000 mg | Freq: Once | INTRAMUSCULAR | Status: DC | PRN

## 2016-08-12 MED ORDER — METHOCARBAMOL 500 MG PO TABS: 500.0000 mg | ORAL_TABLET | Freq: Four times a day (QID) | ORAL | 0 refills | Status: DC | PRN

## 2016-08-12 MED ORDER — CEFAZOLIN SODIUM-DEXTROSE 2-4 GM/100ML-% IV SOLN
2.0000 g | INTRAVENOUS | Status: AC
  Administered 2016-08-12: 2 g via INTRAVENOUS

## 2016-08-12 MED ORDER — ONDANSETRON HCL 4 MG/2ML IJ SOLN
4.0000 mg | Freq: Four times a day (QID) | INTRAMUSCULAR | Status: DC | PRN
  Administered 2016-08-12: 4 mg via INTRAVENOUS
  Filled 2016-08-12: qty 2

## 2016-08-12 MED ORDER — OXYCODONE-ACETAMINOPHEN 5-325 MG PO TABS: 1.0000 | ORAL_TABLET | Freq: Four times a day (QID) | ORAL | 0 refills | Status: DC | PRN

## 2016-08-12 MED ORDER — LIDOCAINE 2% (20 MG/ML) 5 ML SYRINGE
INTRAMUSCULAR | Status: AC
  Filled 2016-08-12: qty 5

## 2016-08-12 MED ORDER — ACETAMINOPHEN 325 MG PO TABS: 650.0000 mg | ORAL_TABLET | ORAL | Status: DC | PRN

## 2016-08-12 MED ORDER — LACTATED RINGERS IV SOLN
INTRAVENOUS | Status: DC
Start: 2016-08-12 — End: 2016-08-13
  Administered 2016-08-12: 12:00:00 via INTRAVENOUS

## 2016-08-12 MED ORDER — 0.9 % SODIUM CHLORIDE (POUR BTL) OPTIME
TOPICAL | Status: DC | PRN
  Administered 2016-08-12: 1000 mL

## 2016-08-12 MED ORDER — ONDANSETRON HCL 4 MG PO TABS
4.0000 mg | ORAL_TABLET | Freq: Four times a day (QID) | ORAL | Status: DC | PRN
Start: 2016-08-12 — End: 2016-08-13

## 2016-08-12 MED ORDER — SODIUM CHLORIDE 0.9 % IV SOLN: INTRAVENOUS | Status: DC

## 2016-08-12 MED ORDER — EPHEDRINE 5 MG/ML INJ
INTRAVENOUS | Status: AC
  Filled 2016-08-12: qty 20

## 2016-08-12 MED ORDER — AMLODIPINE BESYLATE 10 MG PO TABS
10.0000 mg | ORAL_TABLET | Freq: Every day | ORAL | Status: DC
  Administered 2016-08-13: 10 mg via ORAL
  Filled 2016-08-12: qty 1

## 2016-08-12 MED ORDER — MIDAZOLAM HCL 2 MG/2ML IJ SOLN
INTRAMUSCULAR | Status: AC
  Filled 2016-08-12: qty 2

## 2016-08-12 MED ORDER — BUPIVACAINE HCL (PF) 0.25 % IJ SOLN
INTRAMUSCULAR | Status: AC
Start: 2016-08-12 — End: 2016-08-12
  Filled 2016-08-12: qty 30

## 2016-08-12 MED ORDER — DEXAMETHASONE SODIUM PHOSPHATE 10 MG/ML IJ SOLN
INTRAMUSCULAR | Status: DC | PRN
  Administered 2016-08-12: 10 mg via INTRAVENOUS

## 2016-08-12 MED ORDER — PROPOFOL 500 MG/50ML IV EMUL
INTRAVENOUS | Status: DC | PRN
  Administered 2016-08-12: 20 ug/kg/min via INTRAVENOUS

## 2016-08-12 MED ORDER — ARTIFICIAL TEARS OP OINT
TOPICAL_OINTMENT | OPHTHALMIC | Status: AC
  Filled 2016-08-12: qty 3.5

## 2016-08-12 MED ORDER — SODIUM CHLORIDE 0.9% FLUSH: 3.0000 mL | Freq: Two times a day (BID) | INTRAVENOUS | Status: DC

## 2016-08-12 MED ORDER — FENTANYL CITRATE (PF) 250 MCG/5ML IJ SOLN
INTRAMUSCULAR | Status: DC | PRN
  Administered 2016-08-12: 50 ug via INTRAVENOUS
  Administered 2016-08-12: 75 ug via INTRAVENOUS
  Administered 2016-08-12: 25 ug via INTRAVENOUS

## 2016-08-12 MED ORDER — SUGAMMADEX SODIUM 200 MG/2ML IV SOLN
INTRAVENOUS | Status: DC | PRN
  Administered 2016-08-12: 200 mg via INTRAVENOUS

## 2016-08-12 MED ORDER — DEXAMETHASONE SODIUM PHOSPHATE 10 MG/ML IJ SOLN
INTRAMUSCULAR | Status: AC
  Filled 2016-08-12: qty 1

## 2016-08-12 MED ORDER — LACTATED RINGERS IV SOLN
INTRAVENOUS | Status: DC
  Administered 2016-08-12 (×3): via INTRAVENOUS

## 2016-08-12 MED ORDER — TAMSULOSIN HCL 0.4 MG PO CAPS
0.4000 mg | ORAL_CAPSULE | Freq: Every day | ORAL | Status: DC
  Administered 2016-08-13: 0.4 mg via ORAL
  Filled 2016-08-12: qty 1

## 2016-08-12 MED ORDER — PROPOFOL 10 MG/ML IV BOLUS
INTRAVENOUS | Status: DC | PRN
  Administered 2016-08-12: 200 mg via INTRAVENOUS

## 2016-08-12 MED ORDER — ROCURONIUM BROMIDE 50 MG/5ML IV SOSY
PREFILLED_SYRINGE | INTRAVENOUS | Status: AC
  Filled 2016-08-12: qty 5

## 2016-08-12 MED ORDER — ROCURONIUM BROMIDE 100 MG/10ML IV SOLN
INTRAVENOUS | Status: DC | PRN
  Administered 2016-08-12: 40 mg via INTRAVENOUS
  Administered 2016-08-12 (×2): 10 mg via INTRAVENOUS

## 2016-08-12 MED ORDER — BUPIVACAINE-EPINEPHRINE 0.25% -1:200000 IJ SOLN
INTRAMUSCULAR | Status: DC | PRN
  Administered 2016-08-12: 6 mL

## 2016-08-12 MED ORDER — OXYCODONE HCL 5 MG PO TABS
5.0000 mg | ORAL_TABLET | ORAL | Status: DC | PRN
  Administered 2016-08-12: 10 mg via ORAL
  Filled 2016-08-12 (×2): qty 2

## 2016-08-12 MED ORDER — MENTHOL 3 MG MT LOZG: 1.0000 | LOZENGE | OROMUCOSAL | Status: DC | PRN

## 2016-08-12 MED ORDER — MIDAZOLAM HCL 2 MG/2ML IJ SOLN
INTRAMUSCULAR | Status: DC | PRN
  Administered 2016-08-12: 2 mg via INTRAVENOUS

## 2016-08-12 MED ORDER — HYDROMORPHONE HCL 1 MG/ML IJ SOLN
INTRAMUSCULAR | Status: AC
  Filled 2016-08-12: qty 0.5

## 2016-08-12 MED ORDER — PROPOFOL 1000 MG/100ML IV EMUL
INTRAVENOUS | Status: AC
  Filled 2016-08-12: qty 100

## 2016-08-12 MED ORDER — LIDOCAINE HCL (CARDIAC) 20 MG/ML IV SOLN
INTRAVENOUS | Status: DC | PRN
  Administered 2016-08-12: 80 mg via INTRATRACHEAL

## 2016-08-12 MED ORDER — ONDANSETRON HCL 4 MG/2ML IJ SOLN
INTRAMUSCULAR | Status: DC | PRN
  Administered 2016-08-12: 4 mg via INTRAVENOUS

## 2016-08-12 MED ORDER — SODIUM CHLORIDE 0.9 % IV SOLN: 250.0000 mL | INTRAVENOUS | Status: DC

## 2016-08-12 MED ORDER — SODIUM CHLORIDE 0.9% FLUSH: 3.0000 mL | INTRAVENOUS | Status: DC | PRN

## 2016-08-12 MED ORDER — PHENYLEPHRINE 40 MCG/ML (10ML) SYRINGE FOR IV PUSH (FOR BLOOD PRESSURE SUPPORT)
PREFILLED_SYRINGE | INTRAVENOUS | Status: AC
  Filled 2016-08-12: qty 10

## 2016-08-12 MED ORDER — DOCUSATE SODIUM 100 MG PO CAPS
100.0000 mg | ORAL_CAPSULE | Freq: Two times a day (BID) | ORAL | Status: DC
  Administered 2016-08-12 – 2016-08-13 (×2): 100 mg via ORAL
  Filled 2016-08-12 (×2): qty 1

## 2016-08-12 MED ORDER — EPINEPHRINE PF 1 MG/ML IJ SOLN
INTRAMUSCULAR | Status: AC
  Filled 2016-08-12: qty 1

## 2016-08-12 MED ORDER — PANTOPRAZOLE SODIUM 40 MG PO TBEC
40.0000 mg | DELAYED_RELEASE_TABLET | Freq: Every day | ORAL | Status: DC
  Administered 2016-08-13: 40 mg via ORAL
  Filled 2016-08-12: qty 1

## 2016-08-12 MED ORDER — MEPERIDINE HCL 25 MG/ML IJ SOLN: 6.2500 mg | INTRAMUSCULAR | Status: DC | PRN

## 2016-08-12 SURGICAL SUPPLY — 57 items
APL SKNCLS STERI-STRIP NONHPOA (GAUZE/BANDAGES/DRESSINGS) ×1
BENZOIN TINCTURE PRP APPL 2/3 (GAUZE/BANDAGES/DRESSINGS) ×2 IMPLANT
BIT DRILL SRG 14X2.2XFLT CHK (BIT) IMPLANT
BIT DRL SRG 14X2.2XFLT CHK (BIT) ×1
BLADE CLIPPER SURG (BLADE) IMPLANT
BONE CERV LORDOTIC 14.5X12X6 (Bone Implant) ×2 IMPLANT
BONE CERV LORDOTIC 14.5X12X7 (Bone Implant) ×2 IMPLANT
BUR ROUND FLUTED 4 SOFT TCH (BURR) ×2 IMPLANT
CLSR STERI-STRIP ANTIMIC 1/2X4 (GAUZE/BANDAGES/DRESSINGS) ×1 IMPLANT
COLLAR CERV LO CONTOUR FIRM DE (SOFTGOODS) ×1 IMPLANT
CORDS BIPOLAR (ELECTRODE) ×2 IMPLANT
COVER SURGICAL LIGHT HANDLE (MISCELLANEOUS) ×2 IMPLANT
CRADLE DONUT ADULT HEAD (MISCELLANEOUS) ×2 IMPLANT
DRAPE C-ARM 42X72 X-RAY (DRAPES) ×2 IMPLANT
DRAPE MICROSCOPE LEICA (MISCELLANEOUS) ×4 IMPLANT
DRAPE PROXIMA HALF (DRAPES) ×2 IMPLANT
DRILL BIT SKYLINE 14MM (BIT) ×2
DRSG MEPILEX BORDER 4X4 (GAUZE/BANDAGES/DRESSINGS) ×1 IMPLANT
DURAPREP 6ML APPLICATOR 50/CS (WOUND CARE) ×2 IMPLANT
ELECT COATED BLADE 2.86 ST (ELECTRODE) ×2 IMPLANT
ELECT REM PT RETURN 9FT ADLT (ELECTROSURGICAL) ×2
ELECTRODE REM PT RTRN 9FT ADLT (ELECTROSURGICAL) ×1 IMPLANT
EVACUATOR 1/8 PVC DRAIN (DRAIN) ×2 IMPLANT
GAUZE SPONGE 4X4 12PLY STRL (GAUZE/BANDAGES/DRESSINGS) ×2 IMPLANT
GLOVE BIOGEL PI IND STRL 8 (GLOVE) ×2 IMPLANT
GLOVE BIOGEL PI INDICATOR 8 (GLOVE) ×2
GLOVE ORTHO TXT STRL SZ7.5 (GLOVE) ×4 IMPLANT
GOWN STRL REUS W/ TWL LRG LVL3 (GOWN DISPOSABLE) ×1 IMPLANT
GOWN STRL REUS W/ TWL XL LVL3 (GOWN DISPOSABLE) ×1 IMPLANT
GOWN STRL REUS W/TWL 2XL LVL3 (GOWN DISPOSABLE) ×2 IMPLANT
GOWN STRL REUS W/TWL LRG LVL3 (GOWN DISPOSABLE) ×2
GOWN STRL REUS W/TWL XL LVL3 (GOWN DISPOSABLE) ×2
GRAFT BNE SPCR VG2 14.5X12X6 (Bone Implant) IMPLANT
GRAFT BNE SPCR VG2 14.5X12X7 (Bone Implant) IMPLANT
HEAD HALTER (SOFTGOODS) ×2 IMPLANT
HEMOSTAT SURGICEL 2X14 (HEMOSTASIS) IMPLANT
KIT BASIN OR (CUSTOM PROCEDURE TRAY) ×2 IMPLANT
KIT ROOM TURNOVER OR (KITS) ×2 IMPLANT
MANIFOLD NEPTUNE II (INSTRUMENTS) IMPLANT
NDL 25GX 5/8IN NON SAFETY (NEEDLE) ×1 IMPLANT
NEEDLE 25GX 5/8IN NON SAFETY (NEEDLE) ×2 IMPLANT
NS IRRIG 1000ML POUR BTL (IV SOLUTION) ×2 IMPLANT
PACK ORTHO CERVICAL (CUSTOM PROCEDURE TRAY) ×2 IMPLANT
PAD ARMBOARD 7.5X6 YLW CONV (MISCELLANEOUS) ×4 IMPLANT
PATTIES SURGICAL .5 X.5 (GAUZE/BANDAGES/DRESSINGS) ×1 IMPLANT
PIN TEMP SKYLINE THREADED (PIN) ×1 IMPLANT
PLATE SKYLINE TWO LEVEL 28MM (Plate) ×1 IMPLANT
RESTRAINT LIMB HOLDER UNIV (RESTRAINTS) ×1 IMPLANT
SCREW VAR SELF TAP SKYLINE 14M (Screw) ×6 IMPLANT
STRIP CLOSURE SKIN 1/2X4 (GAUZE/BANDAGES/DRESSINGS) ×2 IMPLANT
SURGIFLO W/THROMBIN 8M KIT (HEMOSTASIS) IMPLANT
SUT BONE WAX W31G (SUTURE) ×2 IMPLANT
SUT VIC AB 3-0 X1 27 (SUTURE) ×2 IMPLANT
SUT VICRYL 4-0 PS2 18IN ABS (SUTURE) ×4 IMPLANT
TOWEL OR 17X24 6PK STRL BLUE (TOWEL DISPOSABLE) ×2 IMPLANT
TOWEL OR 17X26 10 PK STRL BLUE (TOWEL DISPOSABLE) ×2 IMPLANT
TRAY FOLEY CATH 16FR SILVER (SET/KITS/TRAYS/PACK) IMPLANT

## 2016-08-12 NOTE — Anesthesia Postprocedure Evaluation (Signed)
Anesthesia Post Note  Patient: Kyle Rivers  Procedure(s) Performed: Procedure(s) (LRB): C5-6, C6-7 ANTERIOR CERVICAL DECOMPRESSION/DISCECTOMY FUSION 2 LEVELS, ALLOGRAFT, PLATE (N/A)  Patient location during evaluation: PACU Anesthesia Type: General Level of consciousness: awake and alert Pain management: pain level controlled Vital Signs Assessment: post-procedure vital signs reviewed and stable Respiratory status: spontaneous breathing, nonlabored ventilation, respiratory function stable and patient connected to nasal cannula oxygen Cardiovascular status: blood pressure returned to baseline and stable Postop Assessment: no signs of nausea or vomiting Anesthetic complications: no       Last Vitals:  Vitals:   08/12/16 1029 08/12/16 1549  BP: (!) 143/91 134/76  Pulse: 86 96  Resp: 18 17  Temp: 36.9 C 36.1 C    Last Pain:  Vitals:   08/12/16 1549  TempSrc:   PainSc: Asleep                 Montez Hageman

## 2016-08-12 NOTE — Anesthesia Preprocedure Evaluation (Addendum)
Anesthesia Evaluation   Patient awake    Reviewed: Allergy & Precautions, NPO status , Patient's Chart, lab work & pertinent test results  Airway Mallampati: II  TM Distance: >3 FB Neck ROM: Limited    Dental  (+) Implants, Dental Advisory Given   Pulmonary Current Smoker,    breath sounds clear to auscultation       Cardiovascular hypertension, + Peripheral Vascular Disease   Rhythm:Regular Rate:Normal     Neuro/Psych    GI/Hepatic   Endo/Other    Renal/GU      Musculoskeletal  (+) Arthritis ,   Abdominal   Peds  Hematology   Anesthesia Other Findings   Reproductive/Obstetrics                            Anesthesia Physical Anesthesia Plan  ASA: II  Anesthesia Plan: General   Post-op Pain Management:    Induction: Intravenous  Airway Management Planned: Oral ETT and Video Laryngoscope Planned  Additional Equipment:   Intra-op Plan:   Post-operative Plan: Extubation in OR  Informed Consent: I have reviewed the patients History and Physical, chart, labs and discussed the procedure including the risks, benefits and alternatives for the proposed anesthesia with the patient or authorized representative who has indicated his/her understanding and acceptance.   Dental advisory given  Plan Discussed with: CRNA  Anesthesia Plan Comments: (See Kabbe NP anesth notes 08/11/16 EKG reviewed, SR, T inverted in V5-6, appears unchanged since 2009 as read by me.)      Anesthesia Quick Evaluation

## 2016-08-12 NOTE — H&P (Signed)
Kyle Rivers is an 61 y.o. male.   Chief Complaint: Neck pain and left upper extremity radiculopathy HPI: Patient with history of C5-6 and C6-7 HNP/stenosis and above complaint presents to the hospital today for surgical intervention. Progressively worsening symptoms. Failed conservative treatment.  Past Medical History:  Diagnosis Date  . BPH (benign prostatic hypertrophy)   . 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 Hemorrhoids   . Hyperlipidemia   . Hypertension   . LVH (left ventricular hypertrophy)    LVH variant, EKG show ST elevation, normal cardiac catherization 2009  . Prostatitis     Past Surgical History:  Procedure Laterality Date  . COLONOSCOPY    . HEMORRHOID SURGERY    . UPPER GASTROINTESTINAL ENDOSCOPY      Family History  Problem Relation Age of Onset  . Stroke Mother   . Hypertension Father   . Diabetes Father   . Diabetes Sister   . Diabetes Sister    Social History:  reports that he has been smoking.  He has smoked for the past 30.00 years. He has quit using smokeless tobacco. He reports that he drinks alcohol. He reports that he does not use drugs.  Allergies:  Allergies  Allergen Reactions  . Statins   . Lipitor [Atorvastatin] Rash and Other (See Comments)    "Bumps showed up on skin "    No prescriptions prior to admission.    No results found for this or any previous visit (from the past 48 hour(s)). No results found.  Review of Systems  HENT: Negative.   Cardiovascular: Negative.   Genitourinary: Negative.   Musculoskeletal: Positive for neck pain.  Skin: Negative.   Neurological: Positive for tingling.    There were no vitals taken for this visit. Physical Exam  Constitutional: He is oriented to person, place, and time. He appears well-developed. No distress.  HENT:  Head: Normocephalic.  Eyes: EOM are normal. Pupils are equal, round, and reactive to light.  Respiratory: No respiratory distress.  GI: He exhibits  no distension.  Musculoskeletal:  patient has brought positive Spurling test on the left. Some improvement in his pain with cervical distraction. Absent triceps reflex. Left triceps atrophy with triceps weakness. 3+ lower extremity reflexes without sustained clonus. He can heel and toe walk. He's noticed some occasional balance problems. Negative straight leg raise and lower extremities right and left. Interossei testing finger extensors are strong.  Lymphadenopathy:    He has no cervical adenopathy.  Neurological: He is alert and oriented to person, place, and time.  Skin: Skin is warm and dry.  Psychiatric: He has a normal mood and affect.    Study Result   CLINICAL DATA:  Neck pain radiating to the left shoulder.  EXAM: MRI CERVICAL SPINE WITHOUT CONTRAST  TECHNIQUE: Multiplanar, multisequence MR imaging of the cervical spine was performed. No intravenous contrast was administered.  COMPARISON:  Cervical spine radiographs 07/09/2016  FINDINGS: Alignment: Normal aside from straightening of the normal cervical lordosis.  Vertebrae: Degenerative endplate signal changes at C4-C5, C5-C6 and C6-C7 with associated anterior and posterior osteophytosis. No focal marrow lesions. No evidence of discitis osteomyelitis. No epidural collection.  Cord: Normal caliber and signal.  Posterior Fossa, vertebral arteries, paraspinal tissues: Visualized posterior fossa is normal. Vertebral artery flow voids are preserved. Normal visualized paraspinal soft tissues.  Disc levels:  C1-C2: Mild degenerative change.  C2-C3: Bilateral mild uncovertebral hypertrophy and left-greater-than-right facet hypertrophy with mild left neural foraminal  narrowing. No spinal canal stenosis.  C3-C4: Moderate right facet hypertrophy and mild right uncovertebral spurring results and moderate right foraminal narrowing. No spinal canal stenosis.  C4-C5: Medium-sized disc osteophyte complex narrows  the ventral thecal sac without causing spinal canal stenosis. Moderate narrowing of the left neural foramen.  C5-C6: Disc osteophyte complex with superimposed medium-sized left foraminal/extraforaminal protrusion. This severely narrows the left neural foramen. There is moderate central spinal canal stenosis with effacement of the ventral thecal sac.  C6-C7: Severe disc space loss with medium-sized disc osteophyte complex with superimposed small central extrusion with inferior migration. The ventral thecal sac is narrowed and there is moderate spinal canal stenosis. There is moderate right and severe left neural foraminal stenosis.  C7-T1: Normal disc space and facets. No spinal canal or neuroforaminal stenosis.  IMPRESSION: 1. Predominantly lower cervical degenerative disc disease with moderate spinal canal stenosis of C5-C6 and C6-C7. 2. Severe left C5-C6 and C6-C7 neural foraminal stenosis. 3. Moderate right C3-C4, left C4-C5 and right C6-C7 neural foraminal stenosis.   Electronically Signed   By: Ulyses Jarred M.D.   On: 07/20/2016 14:07    Assessment/Plan C5-6 and C6-7 HNP/stenosis  We'll proceed with C5-6, C6-7 ANTERIOR CERVICAL DECOMPRESSION/DISCECTOMY FUSION 2 LEVELS, ALLOGRAFT, PLATE as scheduled. Surgical procedure along with possible rehabilitation/recovery time discussed. All questions answered.  Benjiman Core, PA-C 08/12/2016, 9:13 AM

## 2016-08-12 NOTE — Op Note (Signed)
Preop diagnosis: C5-6, C6-7 spondylosis with cervical stenosis, foraminal stenosis.  Postop diagnosis: Same  Procedure: C5-6, C6-7 anterior cervical discectomy and fusion allograft and plate.  Surgeon: Rodell Perna M.D.  Assistant: Benjiman Core PA-C medically necessary and present for the entire procedure  Anesthesia Gen.  EBL see anesthetic record minimal blood loss.  Implants: skyline Depew plate 28 mm. 14 mm screws 6 the G2 graft 6 mm at C5-6 level and 7 mm at C6-7 level.  Drains: One Hemovac neck  Patient said the patella conservative treatment including cervical traction anti-inflammatories prednisone pack with persistent neck pain moderate central stenosis C5-6 C6-7 severe foraminal stenosis with triceps weakness which has progressed. Informed consent was obtained.  Procedure: After standard prepping and draping after intubation with the glide scope without problems with his airway and intubation of at all traction was applied without weight DuraPrep was used air squared with towels Archer tucked at the side with careful padding L foam over the ulnar nerve wrist restraints for traction for Intra-Op x-ray visualization. Air squared with towels sterile skin marker used for the plan incision starting the midline extending to the left. Several prominent subcutaneous veins were noted. Betadine Steri-Drape sterile Mayo stand at the head and thyroid sheet. Timeout procedure completed Ancef prophylaxis given.  Incision was made and platysma was divided in line with skin incision. Underneath the omohyoid large show prominent spur there was one half to 2 cm at C5-6 was exposed short 25 needle placed crosstable lateral C-arm after being draped confirm appropriate level. Spurs removed so 15 retractors were placed and the upper microscope was draped brought in and we progressed down the posterior longitudinal ligament removing the spurs and disc material scraping the endplates. Clark curettes were used  as well as minimal use of the 4 mm bur due to the hard sclerotic bone. Anterior overhanging osteophytes were burred off to flatten it for plate positioning later.  Posterior longitudinal ligament was taken down. Uncovertebral joints were stripped and bone was removed particularly on the left side to make sure that the areas of the foraminal stenosis for relief. Trial sizing sizer showed a 5 mm gap 6 mm graft fit nicely after trial and with the epidural space dry graft was inserted countersunk 2 mm. Identical procedure was performed then at the C6-7 level. There was a 1 mm space between the posterior spurs and these had to have microdissection removal 1 and 2 mm Kerrisons posterior longitudinal ligament showed thickening with disc material with, basal hard and soft disc. This was removed completely decompression of the dura. Trial sizers with traction pulled by the CRNA 7 mm graft placed. It was countersunk spurs removed and then the plate was placed sized listed above and screws were placed after AP and lateral C-arm were checked. Plate angled slightly to the left cephalad but all screws were solid in the bone. Grass were secure. One prominent superficial vein had been ligated on entry with the 2-0 silk suture and was rechecked was dry. Copious irrigation and tiny screwdriver was used to lock in the larger screws. All 6 were tightened clicked. Hemovac placed in and out technique in line with skin incision 3-0 Vicryl and platysma for Vicryl subarticular closure tincture benzoin Steri-Strips and a flex dressing soft cervical collar transferred recovery room in stable condition.

## 2016-08-12 NOTE — Progress Notes (Signed)
Orthopedic Tech Progress Note Patient Details:  Kyle Rivers 1955-08-07 [id number]  Ortho Devices Type of Ortho Device: Soft collar Ortho Device/Splint Interventions: [name], [name] 08/12/2016, 6:58 PM

## 2016-08-12 NOTE — Brief Op Note (Signed)
08/12/2016  3:25 PM  PATIENT:  Kyle Rivers  61 y.o. male  PRE-OPERATIVE DIAGNOSIS:  SPINAL STENOSIS OF C5-6 AND C6-7  POST-OPERATIVE DIAGNOSIS:  SPINAL STENOSIS OF C5-6 AND C6-7  PROCEDURE:  Procedure(s): C5-6, C6-7 ANTERIOR CERVICAL DECOMPRESSION/DISCECTOMY FUSION 2 LEVELS, ALLOGRAFT, PLATE (N/A)  SURGEON:  Surgeon(s) and Role:    * Marybelle Killings, MD - Primary  PHYSICIAN ASSISTANT: james m. Ricard Dillon     ANESTHESIA:   general  EBL:  Total I/O In: 1700 [I.V.:1700] Out: 50 [Blood:50]  BLOOD ADMINISTERED:none  DRAINS: hemovac  LOCAL MEDICATIONS USED:  MARCAINE     SPECIMEN:  none  DISPOSITION OF SPECIMEN:  N/A  COUNTS:  YES  TOURNIQUET:  * No tourniquets in log *  DICTATION: .Dragon Dictation  PLAN OF CARE: Admit for overnight observation  PATIENT DISPOSITION:  PACU - hemodynamically stable.

## 2016-08-12 NOTE — Interval H&P Note (Signed)
History and Physical Interval Note:  08/12/2016 12:25 PM  [name]  has presented today for surgery, with the diagnosis of SPINAL STENOSIS OF C5-6 AND C6-7  The various methods of treatment have been discussed with the patient and family. After consideration of risks, benefits and other options for treatment, the patient has consented to  Procedure(s): C5-6, C6-7 ANTERIOR CERVICAL DECOMPRESSION/DISCECTOMY FUSION 2 LEVELS, ALLOGRAFT, PLATE (N/A) as a surgical intervention .  The patient's history has been reviewed, patient examined, no change in status, stable for surgery.  I have reviewed the patient's chart and labs.  Questions were answered to the patient's satisfaction.     Kyle Rivers

## 2016-08-12 NOTE — Anesthesia Procedure Notes (Signed)
Procedure Name: Intubation Date/Time: 08/12/2016 12:46 PM Performed by: Rejeana Brock L Pre-anesthesia Checklist: Patient identified, Emergency Drugs available, Suction available and Patient being monitored Patient Re-evaluated:Patient Re-evaluated prior to inductionOxygen Delivery Method: Circle System Utilized Preoxygenation: Pre-oxygenation with 100% oxygen Intubation Type: IV induction Ventilation: Mask ventilation without difficulty Laryngoscope Size: Glidescope and 4 Grade View: Grade I Tube type: Oral Tube size: 7.5 mm Number of attempts: 1 Airway Equipment and Method: Stylet,  Oral airway,  Rigid stylet and Video-laryngoscopy Placement Confirmation: ETT inserted through vocal cords under direct vision,  positive ETCO2 and breath sounds checked- equal and bilateral Secured at: 22 cm Tube secured with: Tape Dental Injury: Teeth and Oropharynx as per pre-operative assessment  Comments: Elective glidescope

## 2016-08-12 NOTE — Transfer of Care (Signed)
Immediate Anesthesia Transfer of Care Note  Patient: Kyle Rivers  Procedure(s) Performed: Procedure(s): C5-6, C6-7 ANTERIOR CERVICAL DECOMPRESSION/DISCECTOMY FUSION 2 LEVELS, ALLOGRAFT, PLATE (N/A)  Patient Location: PACU  Anesthesia Type:General  Level of Consciousness: awake and alert   Airway & Oxygen Therapy: Patient Spontanous Breathing and Patient connected to nasal cannula oxygen  Post-op Assessment: Report given to RN, Post -op Vital signs reviewed and stable and Patient moving all extremities X 4  Post vital signs: Reviewed and stable  Last Vitals:  Vitals:   08/12/16 1029 08/12/16 1549  BP: (!) 143/91 134/76  Pulse: 86 96  Resp: 18 17  Temp: 36.9 C 36.1 C    Last Pain:  Vitals:   08/12/16 1040  TempSrc:   PainSc: 3       Patients Stated Pain Goal: 4 (32/02/33 4356)  Complications: No apparent anesthesia complications

## 2016-08-13 ENCOUNTER — Encounter (HOSPITAL_COMMUNITY): Payer: Self-pay | Admitting: General Practice

## 2016-08-13 ENCOUNTER — Telehealth (INDEPENDENT_AMBULATORY_CARE_PROVIDER_SITE_OTHER): Payer: Self-pay

## 2016-08-13 ENCOUNTER — Other Ambulatory Visit (INDEPENDENT_AMBULATORY_CARE_PROVIDER_SITE_OTHER): Payer: Self-pay | Admitting: Surgery

## 2016-08-13 DIAGNOSIS — M4802 Spinal stenosis, cervical region: Secondary | ICD-10-CM | POA: Diagnosis not present

## 2016-08-13 MED ORDER — HYDROCODONE-ACETAMINOPHEN 5-325 MG PO TABS: 1.0000 | ORAL_TABLET | Freq: Four times a day (QID) | ORAL | 0 refills | Status: DC | PRN

## 2016-08-13 MED ORDER — ONDANSETRON 4 MG PO TBDP
4.0000 mg | ORAL_TABLET | Freq: Three times a day (TID) | ORAL | 0 refills | Status: DC | PRN
Start: 2016-08-13 — End: 2016-08-26

## 2016-08-13 NOTE — Telephone Encounter (Signed)
Patients pain medication need prior approval before he can get this filled.  He just had surgery yesterday

## 2016-08-13 NOTE — Addendum Note (Signed)
Addended by: Meyer Cory on: 08/13/2016 04:25 PM   Modules accepted: Orders

## 2016-08-13 NOTE — Telephone Encounter (Signed)
I called pharmacy and advised patient's wife was bringing 7 day supply script which does not require prior auth.

## 2016-08-13 NOTE — Progress Notes (Signed)
Patient and spouse were educated on discharge teaching. Patient stated understanding. IV's D/C, Left forearm IV appeared swollen after removal, counseled to use cool cloths to reduce swelling and to follow up with MD if worsening. Discharged with wife to home.

## 2016-08-13 NOTE — Plan of Care (Signed)
Problem: Pain Management: Goal: Pain level will decrease Outcome: Progressing Medicated once for pain with full relief

## 2016-08-13 NOTE — Plan of Care (Signed)
Problem: Safety: Goal: Ability to remain free from injury will improve Outcome: Progressing Safety precautions maintained  Problem: Pain Managment: Goal: General experience of comfort will improve Outcome: Progressing Denies pain  Problem: Physical Regulation: Goal: Will remain free from infection Outcome: Progressing No signs of infection noted,   Problem: Tissue Perfusion: Goal: Risk factors for ineffective tissue perfusion will decrease Outcome: Progressing SCDs are on, denies signs of DVT

## 2016-08-13 NOTE — Telephone Encounter (Signed)
I spoke with patient and then called pharmacy ([facility] in [place]). They sent prior auth to [facility] office. I called and spoke with Cloyde Reams who entered all medication information and then said it would have to be sent out for further review from pharmacist at third party agency. She informed me that script could be written for 7 days worth of med and would not need authorization. Per Dr. Lorin Mercy, write Hydrocodone 5/325 1-2 poq 6 hrs prn pain #30 with no refills. Script written, he signed. I called patient and his wife is picking up in the [place] office.

## 2016-08-13 NOTE — Progress Notes (Signed)
Subjective: 1 Day Post-Op Procedure(s) (LRB): C5-6, C6-7 ANTERIOR CERVICAL DECOMPRESSION/DISCECTOMY FUSION 2 LEVELS, ALLOGRAFT, PLATE (N/A) Patient reports pain as mild.  Arm pain and numbness gone. No swallowing or vocal problems.  " I feel great, I'm ready to go home."  Objective: Vital signs in last 24 hours: Temp:  [97 F (36.1 C)-98.4 F (36.9 C)] 98.1 F (36.7 C) (04/05 0444) Pulse Rate:  [65-96] 65 (04/05 0444) Resp:  [11-18] 17 (04/05 0444) BP: (124-147)/(69-91) 147/78 (04/05 0444) SpO2:  [99 %-100 %] 99 % (04/05 0444) Weight:  [140 lb (63.5 kg)] 140 lb (63.5 kg) (04/04 1029)  Intake/Output from previous day: 04/04 0701 - 04/05 0700 In: 1960 [P.O.:60; I.V.:1900] Out: 1540 [Urine:1450; Drains:40; Blood:50] Intake/Output this shift: No intake/output data recorded.   Recent Labs  08/12/16 1045  HGB 15.8    Recent Labs  08/12/16 1045  WBC 7.6  RBC 4.82  HCT 45.4  PLT 159    Recent Labs  08/12/16 1045  NA 136  K 4.6  CL 104  CO2 23  BUN 12  CREATININE 1.26*  GLUCOSE 104*  CALCIUM 9.3   No results for input(s): LABPT, INR in the last 72 hours.  Neurologically intact Dg Chest 2 View  Result Date: 08/12/2016 CLINICAL DATA:  Preop for neck surgery. EXAM: CHEST  2 VIEW COMPARISON:  Radiographs of June 08, 2008. FINDINGS: The heart size and mediastinal contours are within normal limits. Both lungs are clear. No pneumothorax or pleural effusion is noted. The visualized skeletal structures are unremarkable. IMPRESSION: No active cardiopulmonary disease. Electronically Signed   By: Marijo Conception, M.D.   On: 08/12/2016 10:49   Dg Cervical Spine 2-3 Views  Result Date: 08/12/2016 CLINICAL DATA:  Status post anterior fusion EXAM: CERVICAL SPINE - 2-3 VIEW; DG C-ARM 61-120 MIN COMPARISON:  None. FLUOROSCOPY TIME:  0 minutes 11 seconds; 2 submitted images FINDINGS: Frontal and lateral views were obtained. There is anterior screw and plate fixation from O2-H4  with disc spacers at C5-6 and C6-7. Support hardware is intact. There is no fracture or spondylolisthesis. The prevertebral soft tissues and predental space regions are normal. There is moderate disc space narrowing at C4-5 with a prominent anterior osteophyte along the inferior aspect of C4. No erosive change. IMPRESSION: Postoperative change with anterior screw and plate fixation from T6-L4 with support hardware intact. There are disc spacers at C5-6 and C6-7. 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 No fracture or spondylolisthesis. Electronically Signed   By: Lowella Grip III M.D.   On: 08/12/2016 15:30   Dg C-arm 1-60 Min  Result Date: 08/12/2016 CLINICAL DATA:  Status post anterior fusion EXAM: CERVICAL SPINE - 2-3 VIEW; DG C-ARM 61-120 MIN COMPARISON:  None. FLUOROSCOPY TIME:  0 minutes 11 seconds; 2 submitted images FINDINGS: Frontal and lateral views were obtained. There is anterior screw and plate fixation from Y5-K3 with disc spacers at C5-6 and C6-7. Support hardware is intact. There is no fracture or spondylolisthesis. The prevertebral soft tissues and predental space regions are normal. There is moderate disc space narrowing at C4-5 with a prominent anterior osteophyte along the inferior aspect of C4. No erosive change. IMPRESSION: Postoperative change with anterior screw and plate fixation from T4-S5 with support hardware intact. There are disc spacers at C5-6 and C6-7. 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 No fracture or spondylolisthesis. Electronically Signed   By: Lowella Grip III M.D.   On: 08/12/2016 15:30    Assessment/Plan: 1 Day Post-Op Procedure(s) (LRB):  C5-6, C6-7 ANTERIOR CERVICAL DECOMPRESSION/DISCECTOMY FUSION 2 LEVELS, ALLOGRAFT, PLATE (N/A) Plan:  Discharge home.   Kyle Rivers 08/13/2016, 7:49 AM

## 2016-08-14 ENCOUNTER — Encounter (HOSPITAL_COMMUNITY): Payer: Self-pay | Admitting: Orthopaedic Surgery

## 2016-08-17 ENCOUNTER — Telehealth (INDEPENDENT_AMBULATORY_CARE_PROVIDER_SITE_OTHER): Payer: Self-pay | Admitting: Orthopaedic Surgery

## 2016-08-17 NOTE — Telephone Encounter (Signed)
Patient's wife Mariann Laster states pt has a bruise near/under incision site.

## 2016-08-17 NOTE — Telephone Encounter (Signed)
I spoke with patient's wife. She states patient has a bruise that is under the incision but that extends down to the top of the chest. She thinks it is about 5 inches. He has a little swelling, but no increased pain in that area. He denies shortness of breath, etc. He is just having trouble resting at night. Patient has appt for first post op visit on Thursday. Patient's wife would like to know if this is something they should be concerned about and if you recommend ice? Patient did take aspirin prior to surgery.

## 2016-08-18 ENCOUNTER — Other Ambulatory Visit: Payer: Self-pay | Admitting: Family Medicine

## 2016-08-18 NOTE — Telephone Encounter (Signed)
Caryl Pina spoke with patient's wife and advised.

## 2016-08-18 NOTE — Telephone Encounter (Signed)
It is OK , it will go away. ucall

## 2016-08-19 ENCOUNTER — Telehealth: Payer: Self-pay | Admitting: *Deleted

## 2016-08-19 NOTE — Telephone Encounter (Signed)
Received VM from patient.   States that he believes that swelling in face is from possible ear infection. States that he has been having ear aches on the side that had swelling.   Call placed to patient to inquire. [place].

## 2016-08-20 ENCOUNTER — Ambulatory Visit (INDEPENDENT_AMBULATORY_CARE_PROVIDER_SITE_OTHER): Payer: BLUE CROSS/BLUE SHIELD | Admitting: Orthopaedic Surgery

## 2016-08-20 ENCOUNTER — Encounter (INDEPENDENT_AMBULATORY_CARE_PROVIDER_SITE_OTHER): Payer: Self-pay | Admitting: Orthopaedic Surgery

## 2016-08-20 ENCOUNTER — Ambulatory Visit (INDEPENDENT_AMBULATORY_CARE_PROVIDER_SITE_OTHER): Payer: Self-pay

## 2016-08-20 VITALS — BP 158/96 | HR 109 | Ht 67.0 in | Wt 140.0 lb

## 2016-08-20 DIAGNOSIS — M47812 Spondylosis without myelopathy or radiculopathy, cervical region: Secondary | ICD-10-CM

## 2016-08-20 NOTE — Progress Notes (Signed)
   Post-Op Visit Note   Patient: Kyle Rivers           Date of Birth: December 31, 1955           MRN: [id number] Visit Date: 08/20/2016 PCP: Vic Blackbird, MD   Assessment & Plan:  Chief Complaint:  Chief Complaint  Patient presents with  . Neck - Routine Post Op   Visit Diagnoses:  1. Spondylosis without myelopathy or radiculopathy, cervical region   Steri-Strips changed incision looks good prescription for Ultram point that was prescribed 1 by mouth 3 times a day when necessary pain. On return visit in 5 weeks lateral flexion-extension C-spine x-ray  Plan:ROV   5 wks   Follow-Up Instructions: No Follow-up on file.   Orders:  Orders Placed This Encounter  Procedures  . XR Cervical Spine 2 or 3 views   No orders of the defined types were placed in this encounter.   Imaging: No results found.  PMFS History: Patient Active Problem List   Diagnosis Date Noted  . Cervical spinal stenosis 08/12/2016  . Carotid artery disease ([place]) 07/28/2016  . Spinal stenosis of cervical region 07/27/2016  . 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 Benign prostatic hyperplasia 07/31/2013  . Abdominal pain, unspecified site 06/25/2013  . Scrotal pain 06/25/2013  . ED (erectile dysfunction) 11/29/2012  . Hyperlipidemia 04/05/2006  . Essential hypertension 04/05/2006  . ABNORMAL HEART RHYTHMS 04/05/2006  . LOW BACK PAIN 04/05/2006   Past Medical History:  Diagnosis Date  . BPH (benign prostatic hypertrophy)   . 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 Ear infection 08/12/2016   pt believes he may be coming down with an ear infection today  . Hemorrhoids   . Hyperlipidemia   . Hypertension   . LVH (left ventricular hypertrophy)    LVH variant, EKG show ST elevation, normal cardiac catherization 2009  . Prostatitis     Family History  Problem Relation Age of Onset  . Stroke Mother   . Hypertension Father   . Diabetes Father    . Diabetes Sister   . Diabetes Sister     Past Surgical History:  Procedure Laterality Date  . ANTERIOR CERVICAL DECOMP/DISCECTOMY FUSION N/A 08/12/2016   Procedure: C5-6, C6-7 ANTERIOR CERVICAL DECOMPRESSION/DISCECTOMY FUSION 2 LEVELS, ALLOGRAFT, PLATE;  Surgeon: Marybelle Killings, MD;  Location: [facility];  Service: Orthopedics;  Laterality: N/A;  . CERVICAL DISC SURGERY  08/13/2015   c 5 c6 c7  . COLONOSCOPY    . HEMORRHOID SURGERY    . UPPER GASTROINTESTINAL ENDOSCOPY     Social History   Occupational History  . Not on file.   Social History Main Topics  . Smoking status: Current Some Day Smoker    Years: 30.00    Types: Cigarettes  . Smokeless tobacco: Former Systems developer     Comment: smokes light, somedays none  . Alcohol use Yes     Comment: Occ  . Drug use: No  . Sexual activity: Yes

## 2016-08-21 NOTE — Telephone Encounter (Signed)
Multiple calls placed to patient with no answer and no return call.   Message to be closed.  

## 2016-08-25 ENCOUNTER — Inpatient Hospital Stay (INDEPENDENT_AMBULATORY_CARE_PROVIDER_SITE_OTHER): Payer: BLUE CROSS/BLUE SHIELD | Admitting: Orthopaedic Surgery

## 2016-08-26 ENCOUNTER — Ambulatory Visit (INDEPENDENT_AMBULATORY_CARE_PROVIDER_SITE_OTHER): Payer: BLUE CROSS/BLUE SHIELD | Admitting: Internal Medicine

## 2016-08-26 ENCOUNTER — Encounter (INDEPENDENT_AMBULATORY_CARE_PROVIDER_SITE_OTHER): Payer: Self-pay | Admitting: *Deleted

## 2016-08-26 ENCOUNTER — Other Ambulatory Visit (INDEPENDENT_AMBULATORY_CARE_PROVIDER_SITE_OTHER): Payer: Self-pay | Admitting: Internal Medicine

## 2016-08-26 ENCOUNTER — Telehealth (INDEPENDENT_AMBULATORY_CARE_PROVIDER_SITE_OTHER): Payer: Self-pay | Admitting: *Deleted

## 2016-08-26 ENCOUNTER — Encounter (INDEPENDENT_AMBULATORY_CARE_PROVIDER_SITE_OTHER): Payer: Self-pay | Admitting: Internal Medicine

## 2016-08-26 VITALS — BP 122/78 | HR 64 | Temp 98.1°F | Ht 67.0 in | Wt 130.8 lb

## 2016-08-26 DIAGNOSIS — R195 Other fecal abnormalities: Secondary | ICD-10-CM

## 2016-08-26 MED ORDER — PEG 3350-KCL-NA BICARB-NACL 420 G PO SOLR: 4000.0000 mL | Freq: Once | ORAL | 0 refills | Status: AC

## 2016-08-26 NOTE — Patient Instructions (Signed)
Colonoscopy.  The risks and benefits such as perforation, bleeding, and infection were reviewed with the patient and is agreeable. 

## 2016-08-26 NOTE — Telephone Encounter (Signed)
Patient needs trilyte 

## 2016-08-26 NOTE — Progress Notes (Signed)
   Subjective:    Patient ID: Kyle Rivers, male    DOB: 1956/03/20, 61 y.o.   MRN: [id number]  HPI Referred by Dr. Buelah Manis for blood in stool. He states he had a positive stool card. He denies seeing any blood.  He has had weight loss due to surgery a few weeks ago for back surgery). His appetite for the most part is good.  He has a BM x 1 a day. He says when he was on the pain medication, he was more constipated. No family hx of colon cancer. He takes the Naproxen as needed.   His last colonoscopy/EGD was in September by Dr.Fields.   FINDINGS:  1. Tortuous colon.  Rare sigmoid colon diverticula and rare sigmoid      colon diverticula.  Otherwise no polyps, masses, inflammatory      changes, or AVMs seen.  2. Patent Schatzki's ring.  Otherwise normal esophagus without      evidence of Barrett's mass or erosion or ulceration.  3. Normal stomach, duodenal bulb, and second portion of the duodenum. CBC    Component Value Date/Time   WBC 7.6 08/12/2016 1045   RBC 4.82 08/12/2016 1045   HGB 15.8 08/12/2016 1045   HCT 45.4 08/12/2016 1045   PLT 159 08/12/2016 1045   MCV 94.2 08/12/2016 1045   MCH 32.8 08/12/2016 1045   MCHC 34.8 08/12/2016 1045   RDW 14.3 08/12/2016 1045   LYMPHSABS 1,656 07/28/2016 1142   MONOABS 828 07/28/2016 1142   EOSABS 0 (L) 07/28/2016 1142   BASOSABS 0 07/28/2016 1142    Hepatic Function Latest Ref Rng & Units 08/12/2016 07/28/2016 07/26/2015  Total Protein 6.5 - 8.1 g/dL 7.8 8.4(H) 8.0  Albumin 3.5 - 5.0 g/dL 4.0 4.8 4.3  AST 15 - 41 U/L '20 18 19  '$ ALT 17 - 63 U/L 12(L) 11 13  Alk Phosphatase 38 - 126 U/L 67 88 84  Total Bilirubin 0.3 - 1.2 mg/dL 0.7 0.6 0.5   07/29/2015 Hep C antibody negative.  Review of Systems     Objective:   Physical Exam Blood pressure 122/78, pulse 64, temperature 98.1 F (36.7 C), height '5\' 7"'$  (1.702 m), weight 130 lb 12.8 oz (59.3 kg). Alert and oriented. Skin warm and dry. Oral mucosa is moist.   . Sclera anicteric,  conjunctivae is pink. Thyroid not enlarged. No cervical lymphadenopathy. Lungs clear. Heart regular rate and rhythm.  Abdomen is soft. Bowel sounds are positive. No hepatomegaly. No abdominal masses felt. No tenderness.  No edema to lower extremities.          Assessment & Plan:  Guaiac positive stool. Colonic neoplasm, polyp, ulcer needs to to be ruled out.   Colonoscopy. The risks and benefits such as perforation, bleeding, and infection were reviewed with the patient and is agreeable.

## 2016-08-27 ENCOUNTER — Ambulatory Visit (INDEPENDENT_AMBULATORY_CARE_PROVIDER_SITE_OTHER): Payer: BLUE CROSS/BLUE SHIELD | Admitting: Orthopaedic Surgery

## 2016-09-03 ENCOUNTER — Encounter (INDEPENDENT_AMBULATORY_CARE_PROVIDER_SITE_OTHER): Payer: Self-pay | Admitting: *Deleted

## 2016-09-07 NOTE — Discharge Summary (Signed)
Patient ID: Kyle Rivers MRN: [id number] DOB/AGE: 60-Mar-1957 60 y.o.  Admit date: 08/12/2016 Discharge date: 09/07/2016  Admission Diagnoses:  Active Problems:   Cervical spinal stenosis   Discharge Diagnoses:  Active Problems:   Cervical spinal stenosis  status post Procedure(s): C5-6, C6-7 ANTERIOR CERVICAL DECOMPRESSION/DISCECTOMY FUSION 2 LEVELS, ALLOGRAFT, PLATE  Past Medical History:  Diagnosis Date  . BPH (benign prostatic hypertrophy)   . 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 Ear infection 08/12/2016   pt believes he may be coming down with an ear infection today  . Hemorrhoids   . Hyperlipidemia   . Hypertension   . LVH (left ventricular hypertrophy)    LVH variant, EKG show ST elevation, normal cardiac catherization 2009  . Prostatitis     Surgeries: Procedure(s): C5-6, C6-7 ANTERIOR CERVICAL DECOMPRESSION/DISCECTOMY FUSION 2 LEVELS, ALLOGRAFT, PLATE on 12/12/1515   Consultants:   Discharged Condition: Improved  Hospital Course: Kyle Rivers is an 61 y.o. male who was admitted 08/12/2016 for operative treatment of cervical stenosis. Patient failed conservative treatments (please see the history and physical for the specifics) and had severe unremitting pain that affects sleep, daily activities and work/hobbies. After pre-op clearance, the patient was taken to the operating room on 08/12/2016 and underwent  Procedure(s): C5-6, C6-7 ANTERIOR CERVICAL DECOMPRESSION/DISCECTOMY FUSION 2 LEVELS, ALLOGRAFT, PLATE.    Patient was given perioperative antibiotics:  Anti-infectives    Start     Dose/Rate Route Frequency Ordered Stop   08/12/16 1028  ceFAZolin (ANCEF) 2-4 GM/100ML-% IVPB    Comments:  Rosenberger, Meredit: cabinet override      08/12/16 1028 08/12/16 1248   08/12/16 1019  ceFAZolin (ANCEF) IVPB 2g/100 mL premix     2 g 200 mL/hr over 30 Minutes Intravenous On call to O.R. 08/12/16 1019 08/12/16 1303       Patient was given  sequential compression devices and early ambulation to prevent DVT.   Patient benefited maximally from hospital stay and there were no complications. At the time of discharge, the patient was urinating/moving their bowels without difficulty, tolerating a regular diet, pain is controlled with oral pain medications and they have been cleared by PT/OT.   Recent vital signs: No data found.    Recent laboratory studies: No results for input(s): WBC, HGB, HCT, PLT, NA, K, CL, CO2, BUN, CREATININE, GLUCOSE, INR, CALCIUM in the last 72 hours.  Invalid input(s): PT, 2   Discharge Medications:   Allergies as of 08/13/2016      Reactions   Oxycodone    " makes me sick with nausea "   Statins    Lipitor [atorvastatin] Rash, Other (See Comments)   "Bumps showed up on skin "      Medication List    TAKE these medications   amLODipine 10 MG tablet Commonly known as:  NORVASC take 1 tablet by mouth once daily   fenofibrate 48 MG tablet Commonly known as:  TRICOR Take 1 tablet (48 mg total) by mouth daily.   fish oil-omega-3 fatty acids 1000 MG capsule Take 1 g by mouth 2 (two) times daily.   naproxen sodium 220 MG tablet Commonly known as:  ANAPROX Take 440 mg by mouth 2 (two) times daily with a meal.   OVER THE COUNTER MEDICATION   tamsulosin 0.4 MG Caps capsule Commonly known as:  FLOMAX take 1 capsule by mouth once daily       Diagnostic Studies: Dg Chest 2  View  Result Date: 08/12/2016 CLINICAL DATA:  Preop for neck surgery. EXAM: CHEST  2 VIEW COMPARISON:  Radiographs of June 08, 2008. FINDINGS: The heart size and mediastinal contours are within normal limits. Both lungs are clear. No pneumothorax or pleural effusion is noted. The visualized skeletal structures are unremarkable. IMPRESSION: No active cardiopulmonary disease. Electronically Signed   By: Marijo Conception, M.D.   On: 08/12/2016 10:49   Dg Cervical Spine 2-3 Views  Result Date: 08/12/2016 CLINICAL DATA:  Status  post anterior fusion EXAM: CERVICAL SPINE - 2-3 VIEW; DG C-ARM 61-120 MIN COMPARISON:  None. FLUOROSCOPY TIME:  0 minutes 11 seconds; 2 submitted images FINDINGS: Frontal and lateral views were obtained. There is anterior screw and plate fixation from X7-W6 with disc spacers at C5-6 and C6-7. Support hardware is intact. There is no fracture or spondylolisthesis. The prevertebral soft tissues and predental space regions are normal. There is moderate disc space narrowing at C4-5 with a prominent anterior osteophyte along the inferior aspect of C4. No erosive change. IMPRESSION: Postoperative change with anterior screw and plate fixation from O0-B5 with support hardware intact. There are disc spacers at C5-6 and C6-7. 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 No fracture or spondylolisthesis. Electronically Signed   By: Lowella Grip III M.D.   On: 08/12/2016 15:30   Dg C-arm 1-60 Min  Result Date: 08/12/2016 CLINICAL DATA:  Status post anterior fusion EXAM: CERVICAL SPINE - 2-3 VIEW; DG C-ARM 61-120 MIN COMPARISON:  None. FLUOROSCOPY TIME:  0 minutes 11 seconds; 2 submitted images FINDINGS: Frontal and lateral views were obtained. There is anterior screw and plate fixation from D9-R4 with disc spacers at C5-6 and C6-7. Support hardware is intact. There is no fracture or spondylolisthesis. The prevertebral soft tissues and predental space regions are normal. There is moderate disc space narrowing at C4-5 with a prominent anterior osteophyte along the inferior aspect of C4. No erosive change. IMPRESSION: Postoperative change with anterior screw and plate fixation from B6-L8 with support hardware intact. There are disc spacers at C5-6 and C6-7. 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 No fracture or spondylolisthesis. Electronically Signed   By: Lowella Grip III M.D.   On: 08/12/2016 15:30   Xr Cervical Spine 2 Or 3 Views  Result Date: 08/20/2016 AP lateral cervical spine x-rays are obtained review shows  satisfactory postoperative fusion at C5-6 C6-7. He has some spurring at C4-5 unchanged. Swelling anterior to the plate as expected postoperatively. Impression: Satisfactory two-level cervical fusion C5-6 C6-7 with plate and graft.     Follow-up Information    Schedule an appointment as soon as possible for a visit with Marybelle Killings, MD.   Specialty:  Orthopedic Surgery Why:  need return office visit one week postop Contact information: [address] [place] [id number] [phone]           Discharge Plan:  discharge to home  Disposition:     Signed: Benjiman Core for mark yates md 09/07/2016, 2:57 PM

## 2016-09-21 ENCOUNTER — Other Ambulatory Visit: Payer: Self-pay | Admitting: Family Medicine

## 2016-09-24 ENCOUNTER — Ambulatory Visit (INDEPENDENT_AMBULATORY_CARE_PROVIDER_SITE_OTHER): Payer: BLUE CROSS/BLUE SHIELD

## 2016-09-24 ENCOUNTER — Ambulatory Visit (INDEPENDENT_AMBULATORY_CARE_PROVIDER_SITE_OTHER): Payer: BLUE CROSS/BLUE SHIELD | Admitting: Orthopaedic Surgery

## 2016-09-24 ENCOUNTER — Encounter (INDEPENDENT_AMBULATORY_CARE_PROVIDER_SITE_OTHER): Payer: Self-pay | Admitting: Orthopaedic Surgery

## 2016-09-24 DIAGNOSIS — M47812 Spondylosis without myelopathy or radiculopathy, cervical region: Secondary | ICD-10-CM | POA: Diagnosis not present

## 2016-09-24 NOTE — Progress Notes (Signed)
   Post-Op Visit Note   Patient: Kyle Rivers           Date of Birth: [birth year]           MRN: [id number] Visit Date: 09/24/2016 PCP: Alycia Rossetti, MD   Assessment & Plan: Post 2 level cervical fusion at 6 weeks with the good relief of his preoperative symptoms.  Chief Complaint:  Chief Complaint  Patient presents with  . Neck - Routine Post Op   Visit Diagnoses:  1. Spondylosis without myelopathy or radiculopathy, cervical region     Plan: He can gradually resume all normal activities. He wanted to come back in 6 weeks to get his lower back evaluated. Patient's abdomen with the surgical result incision is well-healed.  Follow-Up Instructions: No Follow-up on file.   Orders:  Orders Placed This Encounter  Procedures  . XR Cervical Spine 2 or 3 views   No orders of the defined types were placed in this encounter.   Imaging: Xr Cervical Spine 2 Or 3 Views  Result Date: 09/24/2016 AP lateral cervical spine x-rays demonstrate solid the position of 2 level cervical fusion C5-6 C6-7 with partial graft incorporation. No loosening of the screws. Impression: Satisfactory postop to low cervical fusion C5-6 C6-7.   PMFS History: Patient Active Problem List   Diagnosis Date Noted  . Guaiac positive stools 08/26/2016  . Cervical spinal stenosis 08/12/2016  . Carotid artery disease ([place]) 07/28/2016  . Spinal stenosis of cervical region 07/27/2016  . 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 Benign prostatic hyperplasia 07/31/2013  . Abdominal pain, unspecified site 06/25/2013  . Scrotal pain 06/25/2013  . ED (erectile dysfunction) 11/29/2012  . Hyperlipidemia 04/05/2006  . Essential hypertension 04/05/2006  . ABNORMAL HEART RHYTHMS 04/05/2006  . LOW BACK PAIN 04/05/2006   Past Medical History:  Diagnosis Date  . BPH (benign prostatic hypertrophy)   . 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 Ear infection  08/12/2016   pt believes he may be coming down with an ear infection today  . Hemorrhoids   . Hyperlipidemia   . Hypertension   . LVH (left ventricular hypertrophy)    LVH variant, EKG show ST elevation, normal cardiac catherization 2009  . Prostatitis     Family History  Problem Relation Age of Onset  . Stroke Mother   . Hypertension Father   . Diabetes Father   . Diabetes Sister   . Diabetes Sister     Past Surgical History:  Procedure Laterality Date  . ANTERIOR CERVICAL DECOMP/DISCECTOMY FUSION N/A 08/12/2016   Procedure: C5-6, C6-7 ANTERIOR CERVICAL DECOMPRESSION/DISCECTOMY FUSION 2 LEVELS, ALLOGRAFT, PLATE;  Surgeon: Marybelle Killings, MD;  Location: [facility];  Service: Orthopedics;  Laterality: N/A;  . CERVICAL DISC SURGERY  08/13/2015   c 5 c6 c7  . COLONOSCOPY    . HEMORRHOID SURGERY    . UPPER GASTROINTESTINAL ENDOSCOPY     Social History   Occupational History  . Not on file.   Social History Main Topics  . Smoking status: Current Some Day Smoker    Years: 30.00    Types: Cigarettes  . Smokeless tobacco: Former Systems developer     Comment: smokes light, somedays none  . Alcohol use Yes     Comment: Occ  . Drug use: No  . Sexual activity: Yes

## 2016-09-30 ENCOUNTER — Ambulatory Visit (INDEPENDENT_AMBULATORY_CARE_PROVIDER_SITE_OTHER): Payer: BLUE CROSS/BLUE SHIELD | Admitting: Family Medicine

## 2016-09-30 ENCOUNTER — Encounter: Payer: Self-pay | Admitting: Family Medicine

## 2016-09-30 VITALS — BP 148/72 | HR 80 | Temp 98.2°F | Resp 16 | Ht 67.0 in | Wt 131.0 lb

## 2016-09-30 DIAGNOSIS — R109 Unspecified abdominal pain: Secondary | ICD-10-CM | POA: Diagnosis not present

## 2016-09-30 DIAGNOSIS — N390 Urinary tract infection, site not specified: Secondary | ICD-10-CM

## 2016-09-30 LAB — URINALYSIS, ROUTINE W REFLEX MICROSCOPIC
BILIRUBIN URINE: NEGATIVE
GLUCOSE, UA: NEGATIVE
Hgb urine dipstick: NEGATIVE
Ketones, ur: NEGATIVE
Leukocytes, UA: NEGATIVE
Nitrite: NEGATIVE
PH: 6 (ref 5.0–8.0)
PROTEIN: NEGATIVE
Specific Gravity, Urine: 1.02 (ref 1.001–1.035)

## 2016-09-30 MED ORDER — CIPROFLOXACIN HCL 500 MG PO TABS: 500.0000 mg | ORAL_TABLET | Freq: Two times a day (BID) | ORAL | 0 refills | Status: DC

## 2016-09-30 NOTE — Patient Instructions (Signed)
We will call with culture Call me with name of the herbal supplement F/U as needed

## 2016-09-30 NOTE — Progress Notes (Signed)
   Subjective:    Patient ID: Kyle Rivers, male    DOB: [birth year], 61 y.o.   MRN: [id number]  Patient presents for Flank Pain/ Lower Abd Pain (x3 days- L sided flank pain radiating to lower abd- hsa been taking AZO)   Pt here with left flank pain and suprapubic pressure the past 3 days. History of prostatitis in past. He did take some herbal bladder medication and AZO which has helped some. No fever, no vomiting, no change in bowels. No constant pain Has colonoscopy Friday for positive guaiac stools.      Review Of Systems:  GEN- denies fatigue, fever, weight loss,weakness, recent illness HEENT- denies eye drainage, change in vision, nasal discharge, CVS- denies chest pain, palpitations RESP- denies SOB, cough, wheeze ABD- denies N/V, change in stools, abd pain GU-+ dysuria, hematuria, dribbling, incontinence MSK- denies joint pain, muscle aches, injury Neuro- denies headache, dizziness, syncope, seizure activity       Objective:    BP (!) 148/72   Pulse 80   Temp 98.2 F (36.8 C) (Oral)   Resp 16   Ht 5\' 7"  (1.702 m)   Wt 131 lb (59.4 kg)   SpO2 98%   BMI 20.52 kg/m  GEN- NAD, alert and oriented x3 HEENT- PERRL, EOMI, non injected sclera, pink conjunctiva, MMM, oropharynx clear CVS- RRR, no murmur RESP-CTAB ABD-NABS,soft,mild TTP suprapubic region, no CVA tenderness  Pulses- Radial 2+        Assessment & Plan:      Problem List Items Addressed This Visit    None    Visit Diagnoses    Left flank pain    -  Primary   Relevant Orders   Urinalysis, Routine w reflex microscopic (Completed)   Urine culture   Urinary tract infection without hematuria, site unspecified       Concern for infection along the urinary tract, UA was normal, culture sent, No blood noted, not classic of stone. Emprically start Cipro. kub IF NOT IMPROVED   Relevant Orders   Urine culture      Note: This dictation was prepared with Dragon dictation along with smaller phrase  technology. Any transcriptional errors that result from this process are unintentional.

## 2016-10-01 LAB — URINE CULTURE: Organism ID, Bacteria: NO GROWTH

## 2016-10-02 ENCOUNTER — Ambulatory Visit (HOSPITAL_COMMUNITY)
Admission: RE | Admit: 2016-10-02 | Discharge: 2016-10-02 | Disposition: A | Discharge status: Home / Self Care | Payer: BLUE CROSS/BLUE SHIELD | Source: Ambulatory Visit | Attending: Internal Medicine | Admitting: Internal Medicine

## 2016-10-02 ENCOUNTER — Encounter (HOSPITAL_COMMUNITY): Payer: Self-pay | Admitting: *Deleted

## 2016-10-02 ENCOUNTER — Encounter (HOSPITAL_COMMUNITY)
Admission: RE | Disposition: A | Discharge status: Home / Self Care | Payer: Self-pay | Source: Ambulatory Visit | Attending: Internal Medicine

## 2016-10-02 DIAGNOSIS — D123 Benign neoplasm of transverse colon: Secondary | ICD-10-CM | POA: Diagnosis not present

## 2016-10-02 DIAGNOSIS — E785 Hyperlipidemia, unspecified: Secondary | ICD-10-CM | POA: Insufficient documentation

## 2016-10-02 DIAGNOSIS — R195 Other fecal abnormalities: Secondary | ICD-10-CM | POA: Diagnosis not present

## 2016-10-02 DIAGNOSIS — I119 Hypertensive heart disease without heart failure: Secondary | ICD-10-CM | POA: Diagnosis not present

## 2016-10-02 DIAGNOSIS — F17210 Nicotine dependence, cigarettes, uncomplicated: Secondary | ICD-10-CM | POA: Diagnosis not present

## 2016-10-02 DIAGNOSIS — N400 Enlarged prostate without lower urinary tract symptoms: Secondary | ICD-10-CM | POA: Diagnosis not present

## 2016-10-02 DIAGNOSIS — Z888 Allergy status to other drugs, medicaments and biological substances status: Secondary | ICD-10-CM | POA: Insufficient documentation

## 2016-10-02 DIAGNOSIS — Z79899 Other long term (current) drug therapy: Secondary | ICD-10-CM | POA: Diagnosis not present

## 2016-10-02 DIAGNOSIS — K644 Residual hemorrhoidal skin tags: Secondary | ICD-10-CM | POA: Insufficient documentation

## 2016-10-02 DIAGNOSIS — D122 Benign neoplasm of ascending colon: Secondary | ICD-10-CM | POA: Diagnosis not present

## 2016-10-02 DIAGNOSIS — Z885 Allergy status to narcotic agent status: Secondary | ICD-10-CM | POA: Insufficient documentation

## 2016-10-02 DIAGNOSIS — I517 Cardiomegaly: Secondary | ICD-10-CM | POA: Insufficient documentation

## 2016-10-02 HISTORY — PX: POLYPECTOMY: SHX5525

## 2016-10-02 HISTORY — PX: COLONOSCOPY: SHX5424

## 2016-10-02 SURGERY — COLONOSCOPY
Anesthesia: Moderate Sedation

## 2016-10-02 MED ORDER — MEPERIDINE HCL 50 MG/ML IJ SOLN
INTRAMUSCULAR | Status: DC | PRN
  Administered 2016-10-02 (×2): 25 mg via INTRAVENOUS

## 2016-10-02 MED ORDER — MEPERIDINE HCL 50 MG/ML IJ SOLN
INTRAMUSCULAR | Status: AC
  Filled 2016-10-02: qty 1

## 2016-10-02 MED ORDER — STERILE WATER FOR IRRIGATION IR SOLN
Status: DC | PRN
  Administered 2016-10-02: 13:00:00

## 2016-10-02 MED ORDER — MIDAZOLAM HCL 5 MG/5ML IJ SOLN
INTRAMUSCULAR | Status: AC
  Filled 2016-10-02: qty 10

## 2016-10-02 MED ORDER — MIDAZOLAM HCL 5 MG/5ML IJ SOLN
INTRAMUSCULAR | Status: DC | PRN
  Administered 2016-10-02 (×2): 2 mg via INTRAVENOUS
  Administered 2016-10-02: 1 mg via INTRAVENOUS
  Administered 2016-10-02: 2 mg via INTRAVENOUS

## 2016-10-02 MED ORDER — SODIUM CHLORIDE 0.9 % IV SOLN
INTRAVENOUS | Status: DC
  Administered 2016-10-02: 1000 mL via INTRAVENOUS

## 2016-10-02 NOTE — Op Note (Signed)
[facility] Patient Name: Kyle Rivers Procedure Date: 10/02/2016 1:05 PM MRN: [id number] Date of Birth: 1955-09-07 Attending MD: Hildred Laser , MD CSN: [id number] Age: 61 Admit Type: Outpatient Procedure:                Colonoscopy Indications:              Heme positive stool Providers:                Hildred Laser, MD, [place]is Peak B. Sharon Seller, RN, Aram Candela Referring MD:             Modena Nunnery. [place], MD Medicines:                Meperidine 50 mg IV, Midazolam 7 mg IV Complications:            No immediate complications. Estimated Blood Loss:     Estimated blood loss was minimal. Procedure:                Pre-Anesthesia Assessment:                           - Prior to the procedure, a History and Physical                            was performed, and patient medications and                            allergies were reviewed. The patient's tolerance of                            previous anesthesia was also reviewed. The risks                            and benefits of the procedure and the sedation                            options and risks were discussed with the patient.                            All questions were answered, and informed consent                            was obtained. Prior Anticoagulants: The patient has                            taken no previous anticoagulant or antiplatelet                            agents. ASA Grade Assessment: II - A patient with                            mild systemic disease. After reviewing the risks  and benefits, the patient was deemed in                            satisfactory condition to undergo the procedure.                           After obtaining informed consent, the colonoscope                            was passed under direct vision. Throughout the                            procedure, the patient's blood pressure, pulse, and                            oxygen  saturations were monitored continuously. The                            EC-[id number] ([id number]) scope was introduced through                            the anus and advanced to the the cecum, identified                            by appendiceal orifice and ileocecal valve. The                            colonoscopy was performed without difficulty. The                            patient tolerated the procedure well. The quality                            of the bowel preparation was good. The ileocecal                            valve, appendiceal orifice, and rectum were                            photographed. Scope In: 1:35:49 PM Scope Out: 2:06:36 PM Scope Withdrawal Time: 0 hours 18 minutes 39 seconds  Total Procedure Duration: 0 hours 30 minutes 47 seconds  Findings:      The perianal and digital rectal examinations were normal.      A 5 mm polyp was found in the proximal ascending colon. The polyp was       sessile. The polyp was removed with a cold snare. Resection and       retrieval were complete. The pathology specimen was placed into Bottle       Number 1.      Four sessile polyps were found in the hepatic flexure. The polyps were 3       to 6 mm in size. These polyps were removed with a cold snare. Resection       and retrieval were complete. The pathology specimen was placed into  Bottle Number 1.      A 4 mm polyp was found in the transverse colon. The polyp was sessile.       Biopsies were taken with a cold forceps for histology. The pathology       specimen was placed into Bottle Number 1.      The exam was otherwise normal throughout the examined colon.      A post hemorrhoidectomy was found in the distal rectum.      External hemorrhoids were found during retroflexion. The hemorrhoids       were small. Impression:               - One 5 mm polyp in the proximal ascending colon,                            removed with a cold snare. Resected and retrieved.                            - Four 3 to 6 mm polyps at the hepatic flexure,                            removed with a cold snare. Resected and retrieved.                           - One 4 mm polyp in the transverse colon. Biopsied.                           - External hemorrhoids. Moderate Sedation:      Moderate (conscious) sedation was administered by the endoscopy nurse       and supervised by the endoscopist. The following parameters were       monitored: oxygen saturation, heart rate, blood pressure, CO2       capnography and response to care. Total physician intraservice time was       37 minutes. Recommendation:           - Patient has a contact number available for                            emergencies. The signs and symptoms of potential                            delayed complications were discussed with the                            patient. Return to normal activities tomorrow.                            Written discharge instructions were provided to the                            patient.                           - Resume previous diet today.                           -  Continue present medications.                           - Await pathology results.                           - Repeat colonoscopy date to be determined after                            pending pathology results are reviewed for                            surveillance. Procedure Code(s):        --- Professional ---                           [phone], Colonoscopy, flexible; with removal of                            tumor(s), polyp(s), or other lesion(s) by snare                            technique                           [id number], 59, Colonoscopy, flexible; with biopsy,                            single or multiple                           99152, Moderate sedation services provided by the                            same physician or other qualified health care                            professional performing the  diagnostic or                            therapeutic service that the sedation supports,                            requiring the presence of an independent trained                            observer to assist in the monitoring of the                            patient's level of consciousness and physiological                            status; initial 15 minutes of intraservice time,                            patient age 28 years or older  03546, Moderate sedation services; each additional                            15 minutes intraservice time Diagnosis Code(s):        --- Professional ---                           D12.2, Benign neoplasm of ascending colon                           D12.3, Benign neoplasm of transverse colon (hepatic                            flexure or splenic flexure)                           K64.4, Residual hemorrhoidal skin tags                           R19.5, Other fecal abnormalities CPT copyright 2016 American Medical Association. All rights reserved. The codes documented in this report are preliminary and upon coder review may  be revised to meet current compliance requirements. Hildred Laser, MD Hildred Laser, MD 10/02/2016 2:14:23 PM This report has been signed electronically. Number of Addenda: 0

## 2016-10-02 NOTE — Discharge Instructions (Signed)
Resume usual medications and diet. No driving for 24 hours. Physician will call with biopsy results.

## 2016-10-02 NOTE — H&P (Signed)
Kyle Rivers is an 61 y.o. male.   Chief Complaint: Patient is here for colonoscopy. HPI: She is 61 year old African-American male who was noted to have hemostasis stool on routine rectal exam. He denies melena or rectal bleeding. He also denies abdominal pain. He has sporadic problems with constipation that he controls his diet. He did have hemorrhoid surgery 9 years ago. Last colonoscopy was in 2009. He does not take aspirin or other OTC NSAIDs. Family history is negative for CRC. Recent hemoglobin was 15.8 g.  Past Medical History:  Diagnosis Date  . BPH (benign prostatic hypertrophy)   . 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 Ear infection 08/12/2016   pt believes he may be coming down with an ear infection today  . Hemorrhoids   . Hyperlipidemia   . Hypertension   . LVH (left ventricular hypertrophy)    LVH variant, EKG show ST elevation, normal cardiac catherization 2009  . Prostatitis     Past Surgical History:  Procedure Laterality Date  . ANTERIOR CERVICAL DECOMP/DISCECTOMY FUSION N/A 08/12/2016   Procedure: C5-6, C6-7 ANTERIOR CERVICAL DECOMPRESSION/DISCECTOMY FUSION 2 LEVELS, ALLOGRAFT, PLATE;  Surgeon: Marybelle Killings, MD;  Location: [place];  Service: Orthopedics;  Laterality: N/A;  . CARDIAC CATHETERIZATION    . CERVICAL DISC SURGERY  08/13/2015   c 5 c6 c7  . COLONOSCOPY    . HEMORRHOID SURGERY    . UPPER GASTROINTESTINAL ENDOSCOPY      Family History  Problem Relation Age of Onset  . Stroke Mother   . Hypertension Father   . Diabetes Father   . Prostate cancer Father   . Diabetes Sister   . Diabetes Sister    Social History:  reports that he has been smoking Cigarettes.  He has a 15.00 pack-year smoking history. He has quit using smokeless tobacco. He reports that he drinks alcohol. He reports that he does not use drugs.  Allergies:  Allergies  Allergen Reactions  . Oxycodone     " makes me sick with nausea "  . Lipitor [Atorvastatin] Rash  and Other (See Comments)    "Bumps showed up on skin "    Medications Prior to Admission  Medication Sig Dispense Refill  . amLODipine (NORVASC) 10 MG tablet take 1 tablet by mouth once daily 90 tablet 0  . ciprofloxacin (CIPRO) 500 MG tablet Take 1 tablet (500 mg total) by mouth 2 (two) times daily. 14 tablet 0  . esomeprazole (NEXIUM) 40 MG capsule take 1 capsule by mouth once daily at noon 90 capsule 3  . fenofibrate (TRICOR) 48 MG tablet Take 1 tablet (48 mg total) by mouth daily. 30 tablet 6  . fish oil-omega-3 fatty acids 1000 MG capsule Take 2 g by mouth daily.     . Menthol-Methyl Salicylate (MUSCLE RUB) 10-15 % CREA Apply 1 application topically as needed for muscle pain.    . tamsulosin (FLOMAX) 0.4 MG CAPS capsule take 1 capsule by mouth once daily 90 capsule 0  . TURMERIC PO Take 1 tablet by mouth daily.      Results for orders placed or performed in visit on 09/30/16 (from the past 48 hour(s))  Urinalysis, Routine w reflex microscopic     Status: None   Collection Time: 09/30/16  3:32 PM  Result Value Ref Range   Color, Urine YELLOW YELLOW   APPearance CLEAR CLEAR   Specific Gravity, Urine 1.020 1.001 - 1.035   pH 6.0  5.0 - 8.0   Glucose, UA NEGATIVE NEGATIVE   Bilirubin Urine NEGATIVE NEGATIVE   Ketones, ur NEGATIVE NEGATIVE   Hgb urine dipstick NEGATIVE NEGATIVE   Protein, ur NEGATIVE NEGATIVE   Nitrite NEGATIVE NEGATIVE   Leukocytes, UA NEGATIVE NEGATIVE  Urine culture     Status: None   Collection Time: 09/30/16  3:46 PM  Result Value Ref Range   Organism ID, Bacteria NO GROWTH    No results found.  ROS  Blood pressure 132/86, pulse 65, temperature 98.5 F (36.9 C), temperature source Oral, resp. rate 10, height 5\' 7"  (1.702 m), weight 135 lb (61.2 kg), SpO2 99 %. Physical Exam  Constitutional:  Well-developed thin male in NAD.  HENT:  Mouth/Throat: Oropharynx is clear and moist.  Eyes: Conjunctivae are normal. No scleral icterus.  Neck: No thyromegaly  present.  Cardiovascular: Normal rate, regular rhythm and normal heart sounds.   No murmur heard. Respiratory: Effort normal and breath sounds normal.  GI: Soft. He exhibits no distension. There is no tenderness.  Musculoskeletal: He exhibits no edema.  Lymphadenopathy:    He has no cervical adenopathy.  Neurological: He is alert.  Skin: Skin is warm and dry.     Assessment/Plan Heme-positive stool. Diagnostic colonoscopy.  Hildred Laser, MD 10/02/2016, 1:24 PM

## 2016-10-06 ENCOUNTER — Other Ambulatory Visit: Payer: Self-pay | Admitting: *Deleted

## 2016-10-06 ENCOUNTER — Ambulatory Visit (HOSPITAL_COMMUNITY)
Admission: RE | Admit: 2016-10-06 | Discharge: 2016-10-06 | Disposition: A | Discharge status: Home / Self Care | Payer: BLUE CROSS/BLUE SHIELD | Source: Ambulatory Visit | Attending: Family Medicine | Admitting: Family Medicine

## 2016-10-06 ENCOUNTER — Telehealth: Payer: Self-pay | Admitting: Family Medicine

## 2016-10-06 DIAGNOSIS — R109 Unspecified abdominal pain: Secondary | ICD-10-CM

## 2016-10-06 NOTE — Telephone Encounter (Signed)
Pt called saying that the antibiotics prescribed for abdominal pain are not working it is still the same or worse, wants to know what we can do to help him. Please call back.

## 2016-10-06 NOTE — Telephone Encounter (Signed)
Call placed to patient.   Per lab results, KUB ordered.   Call placed to patient and patient made aware.

## 2016-10-08 ENCOUNTER — Telehealth: Payer: Self-pay | Admitting: *Deleted

## 2016-10-08 NOTE — Telephone Encounter (Signed)
Received call from patient.   States that he has been using Miralax daily. Reports that he has been having very small, ball like bowel movements, and large amounts of flatulence. States that he no longer has pain in side since gas is moving.   Advised to increase Miralax to Q 4 hrs PRN until he feels that he has cleaned self out, then resume QD dosing.   MD to be made aware.

## 2016-10-09 ENCOUNTER — Encounter (HOSPITAL_COMMUNITY): Payer: Self-pay | Admitting: Internal Medicine

## 2016-10-09 NOTE — Telephone Encounter (Signed)
noted 

## 2016-11-02 ENCOUNTER — Other Ambulatory Visit: Payer: Self-pay | Admitting: Family Medicine

## 2016-11-05 ENCOUNTER — Ambulatory Visit (INDEPENDENT_AMBULATORY_CARE_PROVIDER_SITE_OTHER): Payer: BLUE CROSS/BLUE SHIELD | Admitting: Orthopaedic Surgery

## 2016-11-05 ENCOUNTER — Encounter (INDEPENDENT_AMBULATORY_CARE_PROVIDER_SITE_OTHER): Payer: Self-pay | Admitting: Orthopaedic Surgery

## 2016-11-05 VITALS — Ht 67.0 in | Wt 140.0 lb

## 2016-11-05 DIAGNOSIS — M47816 Spondylosis without myelopathy or radiculopathy, lumbar region: Secondary | ICD-10-CM

## 2016-11-05 NOTE — Progress Notes (Signed)
Office Visit Note   Patient: Kyle Rivers           Date of Birth: [birth year]           MRN: [id number] Visit Date: 11/05/2016              Requested by: Alycia Rossetti, MD [id number] [place], [place] [address] PCP: Alycia Rossetti, MD   Assessment & Plan: Visit Diagnoses:  1. Facet degeneration of lumbar region     Plan: Patient has some back aching with standing. He is neurologically intact no evidence radiculopathy no claudication symptoms. We discussed occasionally using anti-inflammatories is going to be standing such as at a wedding or other functions. He will return if he develops radicular symptoms or claudication symptoms. He is very happy with the surgical result from his neck surgery. I discussed with them that there is no indication for imaging studies for his lumbar spine and less he develops progressive symptoms.  Follow-Up Instructions: Return if symptoms worsen or fail to improve.   Orders:  No orders of the defined types were placed in this encounter.  No orders of the defined types were placed in this encounter.     Procedures: No procedures performed   Clinical Data: No additional findings.   Subjective: Chief Complaint  Patient presents with  . Lower Back - Pain  . Neck - Routine Post Op    HPI 61-year-old male returns post 2 level cervical fusion for orders/08/2016. Previous x-ray showed good position he states he's got 90% plus pain relief no pain in the shoulders and hands she's been doing yard work. He is here concerning his back problems. Previous x-ray showed some spurring. He has pain when he stands at the lumbosacral junction a free standing for about 10 minutes. If exam [facility] [place] can walk as far as he would like. At times he has pain that radiates down from his back and his left leg and his foot and he states his left foot gets more numb than his right foot. He gets relief if he sits down. He's used some sports cream. He's not used  any anti-inflammatory medicine yet. He denies any associated bowel or bladder symptoms. He has not noticed any weakness in his legs.  Review of Systems 14 point review of systems updated and is unchanged from April 2018 office visits and surgery of them then and mentioned in history of present illness.  Objective: Vital Signs: Ht 5\' 7"  (1.702 m)   Wt 140 lb (63.5 kg)   BMI 21.93 kg/m   Physical Exam  Constitutional: He is oriented to person, place, and time. He appears well-developed and well-nourished.  HENT:  Head: Normocephalic and atraumatic.  Eyes: EOM are normal. Pupils are equal, round, and reactive to light.  Neck: No tracheal deviation present. No thyromegaly present.  Cardiovascular: Normal rate.   Pulmonary/Chest: Effort normal. He has no wheezes.  Abdominal: Soft. Bowel sounds are normal.  Musculoskeletal:  Well healed anterior cervical incision with good cosmesis good cervical range of motion no brachial plexus tenderness. Reflexes upper extremities are normal. He can heel and toe walk negative straight leg raising 90. Mild discomfort lumbosacral junction. He has some mild discomfort with bending and twisting. Distal pulses are intact.  Neurological: He is alert and oriented to person, place, and time.  Skin: Skin is warm and dry. Capillary refill takes less than 2 seconds.  Psychiatric: He has a normal mood and  affect. His behavior is normal. Judgment and thought content normal.    Ortho Exam  Specialty Comments:  No specialty comments available.  Imaging: No results found.   PMFS History: Patient Active Problem List   Diagnosis Date Noted  . Guaiac positive stools 08/26/2016  . Cervical spinal stenosis 08/12/2016  . Carotid artery disease ([place]) 07/28/2016  . Spinal stenosis of cervical region 07/27/2016  . 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 Benign prostatic hyperplasia 07/31/2013  . Abdominal pain, unspecified  site 06/25/2013  . Scrotal pain 06/25/2013  . ED (erectile dysfunction) 11/29/2012  . Hyperlipidemia 04/05/2006  . Essential hypertension 04/05/2006  . ABNORMAL HEART RHYTHMS 04/05/2006  . LOW BACK PAIN 04/05/2006   Past Medical History:  Diagnosis Date  . BPH (benign prostatic hypertrophy)   . 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 Ear infection 08/12/2016   pt believes he may be coming down with an ear infection today  . Hemorrhoids   . Hyperlipidemia   . Hypertension   . LVH (left ventricular hypertrophy)    LVH variant, EKG show ST elevation, normal cardiac catherization 2009  . Prostatitis     Family History  Problem Relation Age of Onset  . Stroke Mother   . Hypertension Father   . Diabetes Father   . Prostate cancer Father   . Diabetes Sister   . Diabetes Sister     Past Surgical History:  Procedure Laterality Date  . ANTERIOR CERVICAL DECOMP/DISCECTOMY FUSION N/A 08/12/2016   Procedure: C5-6, C6-7 ANTERIOR CERVICAL DECOMPRESSION/DISCECTOMY FUSION 2 LEVELS, ALLOGRAFT, PLATE;  Surgeon: Marybelle Killings, MD;  Location: [place];  Service: Orthopedics;  Laterality: N/A;  . CARDIAC CATHETERIZATION    . CERVICAL DISC SURGERY  08/13/2015   c 5 c6 c7  . COLONOSCOPY    . COLONOSCOPY N/A 10/02/2016   Procedure: COLONOSCOPY;  Surgeon: Rogene Houston, MD;  [facility];  Service: Endoscopy;  Laterality: N/A;  12:45  . HEMORRHOID SURGERY    . POLYPECTOMY  10/02/2016   Procedure: POLYPECTOMY;  Surgeon: Rogene Houston, MD;  [facility];  Service: Endoscopy;;  colon  . UPPER GASTROINTESTINAL ENDOSCOPY     Social History   Occupational History  . Not on file.   Social History Main Topics  . Smoking status: Current Some Day Smoker    Packs/day: 0.50    Years: 30.00    Types: Cigarettes  . Smokeless tobacco: Former Systems developer     Comment: smokes light, somedays none  . Alcohol use Yes     Comment: Occ  . Drug use: No  . Sexual activity: Yes

## 2017-02-07 ENCOUNTER — Other Ambulatory Visit: Payer: Self-pay | Admitting: Family Medicine

## 2017-03-15 ENCOUNTER — Other Ambulatory Visit: Payer: Self-pay | Admitting: *Deleted

## 2017-03-15 MED ORDER — FENOFIBRATE 48 MG PO TABS: 48.0000 mg | ORAL_TABLET | Freq: Every day | ORAL | 6 refills | Status: DC

## 2017-03-18 ENCOUNTER — Other Ambulatory Visit: Payer: Self-pay | Admitting: *Deleted

## 2017-03-18 MED ORDER — FENOFIBRATE 48 MG PO TABS: 48.0000 mg | ORAL_TABLET | Freq: Every day | ORAL | 6 refills | Status: DC

## 2017-06-03 ENCOUNTER — Telehealth: Payer: Self-pay | Admitting: *Deleted

## 2017-06-03 ENCOUNTER — Other Ambulatory Visit: Payer: Self-pay | Admitting: *Deleted

## 2017-06-03 MED ORDER — TAMSULOSIN HCL 0.4 MG PO CAPS: 0.4000 mg | ORAL_CAPSULE | Freq: Every day | ORAL | 0 refills | Status: DC

## 2017-06-03 MED ORDER — AMLODIPINE BESYLATE 10 MG PO TABS: 10.0000 mg | ORAL_TABLET | Freq: Every day | ORAL | 0 refills | Status: DC

## 2017-06-03 NOTE — Telephone Encounter (Signed)
Received PA determination.   PA denied. Medication is not covered by plan.   MD please advise.

## 2017-06-03 NOTE — Telephone Encounter (Signed)
[employer] is reviewing your PA request. Typically an electronic response will be received within 72 hours. To check for an update later, open this request from your dashboard. 

## 2017-06-03 NOTE — Telephone Encounter (Signed)
Received request from pharmacy for PA on Nexium.  PA submitted.   Dx: GERD

## 2017-06-04 NOTE — Telephone Encounter (Signed)
He can try omeprazole 40mg  or protonix Or buy it OTC

## 2017-06-09 MED ORDER — PANTOPRAZOLE SODIUM 40 MG PO TBEC: 40.0000 mg | DELAYED_RELEASE_TABLET | Freq: Every day | ORAL | 3 refills | Status: DC

## 2017-06-09 NOTE — Telephone Encounter (Signed)
Call placed to patient and patient made aware.   Prescription sent to pharmacy for Protonix.

## 2017-08-10 IMAGING — RF DG C-ARM 61-120 MIN
1 series · 2 of 2 positions shown · non-contrast
Comparison: None.

FLUOROSCOPY TIME:  0 minutes 11 seconds; 2 submitted images

CLINICAL DATA: Status post anterior fusion

EXAM:
CERVICAL SPINE - 2-3 VIEW; DG C-ARM 61-120 MIN

[Series 1: run · 2 of 2 slices shown]
[im 1/2]
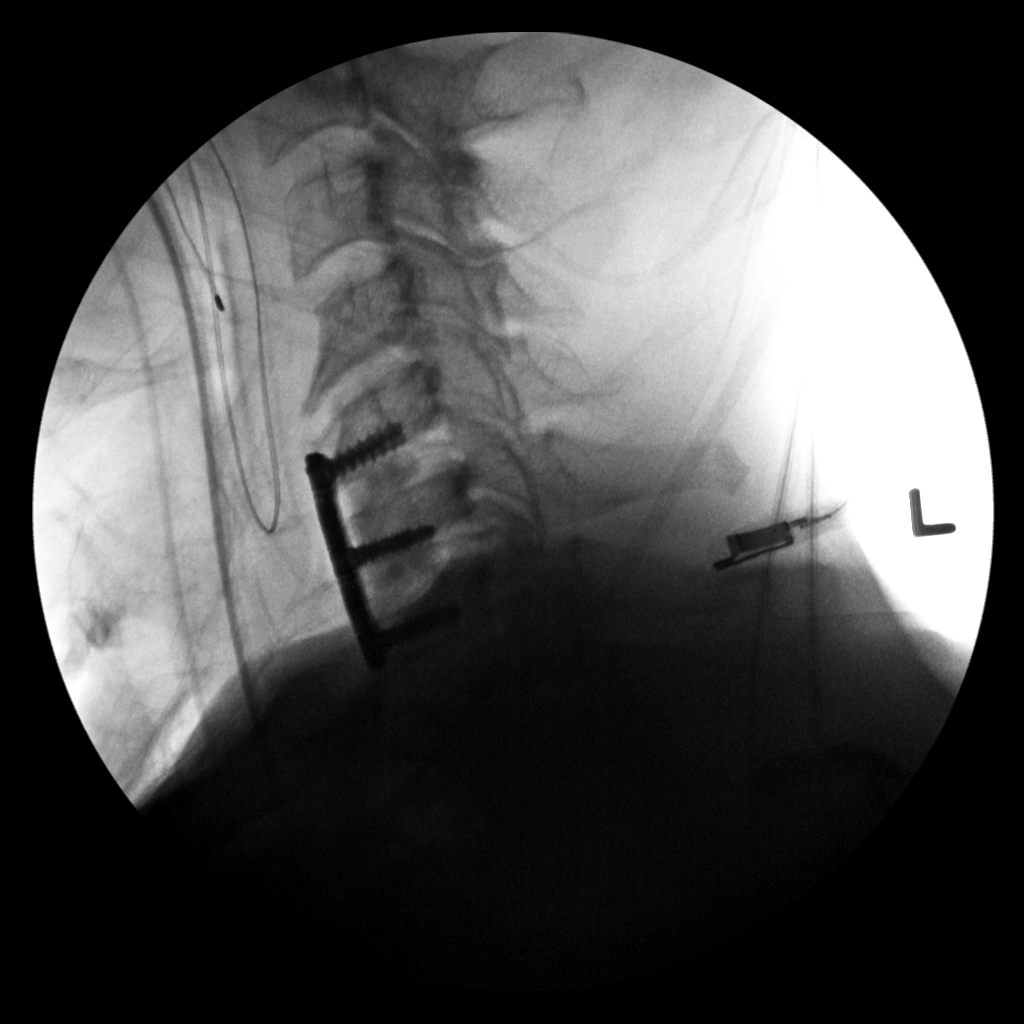
[im 2/2]
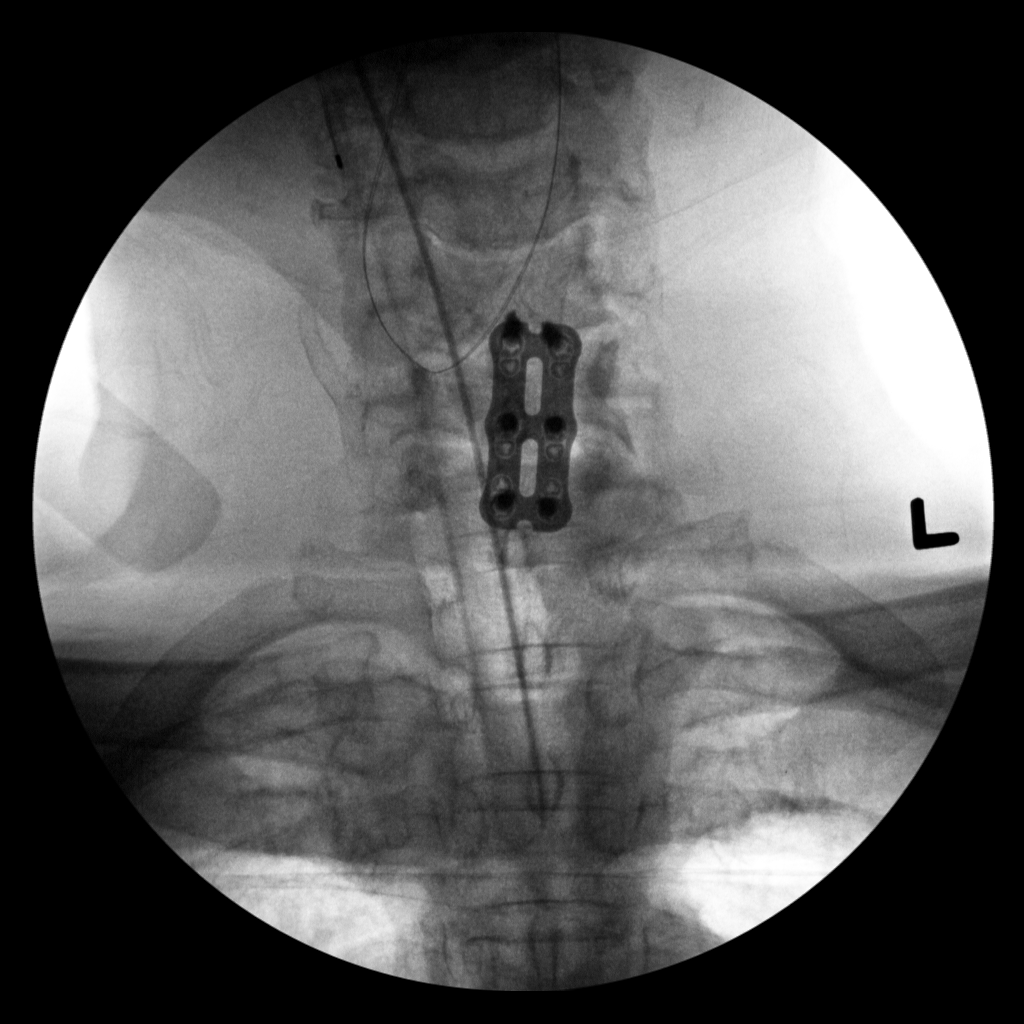

[2 of 2 positions shown; findings below may reference images not displayed]

FINDINGS: Frontal and lateral views were obtained. There is anterior screw and
plate fixation from C5-C7 with disc spacers at C5-6 and C6-7.
Support hardware is intact. There is no fracture or
spondylolisthesis. The prevertebral soft tissues and predental space
regions are normal. There is moderate disc space narrowing at C4-5
with a prominent anterior osteophyte along the inferior aspect of
C4. No erosive change.
IMPRESSION: Postoperative change with anterior screw and plate fixation from
C5-C7 with support hardware intact. There are disc spacers at C5-6
and C6-7. Moderate osteoarthritic change at C4-5. No fracture or
spondylolisthesis.

## 2017-09-06 ENCOUNTER — Other Ambulatory Visit: Payer: Self-pay | Admitting: Family Medicine

## 2017-09-26 ENCOUNTER — Other Ambulatory Visit: Payer: Self-pay | Admitting: Family Medicine

## 2017-09-29 ENCOUNTER — Other Ambulatory Visit: Payer: Self-pay | Admitting: *Deleted

## 2017-09-29 ENCOUNTER — Encounter: Payer: Self-pay | Admitting: *Deleted

## 2017-09-29 MED ORDER — FENOFIBRATE 48 MG PO TABS: 48.0000 mg | ORAL_TABLET | Freq: Every day | ORAL | 0 refills | Status: DC

## 2017-09-29 MED ORDER — AMLODIPINE BESYLATE 10 MG PO TABS: 10.0000 mg | ORAL_TABLET | Freq: Every day | ORAL | 0 refills | Status: DC

## 2017-09-29 MED ORDER — TAMSULOSIN HCL 0.4 MG PO CAPS: 0.4000 mg | ORAL_CAPSULE | Freq: Every day | ORAL | 0 refills | Status: DC

## 2017-09-29 MED ORDER — PANTOPRAZOLE SODIUM 40 MG PO TBEC: 40.0000 mg | DELAYED_RELEASE_TABLET | Freq: Every day | ORAL | 0 refills | Status: DC

## 2017-11-08 ENCOUNTER — Other Ambulatory Visit: Payer: Self-pay

## 2017-11-08 ENCOUNTER — Ambulatory Visit (INDEPENDENT_AMBULATORY_CARE_PROVIDER_SITE_OTHER): Payer: Commercial Managed Care - PPO | Admitting: Family Medicine

## 2017-11-08 ENCOUNTER — Encounter: Payer: Self-pay | Admitting: Family Medicine

## 2017-11-08 VITALS — BP 112/68 | HR 82 | Temp 98.6°F | Resp 14 | Ht 67.0 in | Wt 138.0 lb

## 2017-11-08 DIAGNOSIS — I10 Essential (primary) hypertension: Secondary | ICD-10-CM

## 2017-11-08 DIAGNOSIS — E782 Mixed hyperlipidemia: Secondary | ICD-10-CM

## 2017-11-08 DIAGNOSIS — N400 Enlarged prostate without lower urinary tract symptoms: Secondary | ICD-10-CM | POA: Diagnosis not present

## 2017-11-08 DIAGNOSIS — Z125 Encounter for screening for malignant neoplasm of prostate: Secondary | ICD-10-CM | POA: Diagnosis not present

## 2017-11-08 MED ORDER — AMLODIPINE BESYLATE 10 MG PO TABS: 10.0000 mg | ORAL_TABLET | Freq: Every day | ORAL | 3 refills | Status: DC

## 2017-11-08 MED ORDER — PANTOPRAZOLE SODIUM 40 MG PO TBEC: 40.0000 mg | DELAYED_RELEASE_TABLET | Freq: Every day | ORAL | 3 refills | Status: DC

## 2017-11-08 MED ORDER — FENOFIBRATE 48 MG PO TABS: 48.0000 mg | ORAL_TABLET | Freq: Every day | ORAL | 3 refills | Status: DC

## 2017-11-08 MED ORDER — TAMSULOSIN HCL 0.4 MG PO CAPS: 0.4000 mg | ORAL_CAPSULE | Freq: Every day | ORAL | 3 refills | Status: DC

## 2017-11-08 NOTE — Progress Notes (Signed)
   Subjective:    Patient ID: Kyle Rivers, male    DOB: [birth year], 62 y.o.   MRN: [id number]  Patient presents for Follow-up (is not fasting)  Pt here to f/u chronic medical.    HTN- taking medications as prescribed , no concerns  Hyperlipidemia- taking tricor    Exercises regulary           Review Of Systems:  GEN- denies fatigue, fever, weight loss,weakness, recent illness HEENT- denies eye drainage, change in vision, nasal discharge, CVS- denies chest pain, palpitations RESP- denies SOB, cough, wheeze ABD- denies N/V, change in stools, abd pain GU- denies dysuria, hematuria, dribbling, incontinence MSK- denies joint pain, muscle aches, injury Neuro- denies headache, dizziness, syncope, seizure activity       Objective:    BP 112/68   Pulse 82   Temp 98.6 F (37 C) (Oral)   Resp 14   Ht 5\' 7"  (1.702 m)   Wt 138 lb (62.6 kg)   SpO2 98%   BMI 21.61 kg/m  GEN- NAD, alert and oriented x3 HEENT- PERRL, EOMI, non injected sclera, pink conjunctiva, MMM, oropharynx clear Neck- Supple, no thyromegaly CVS- RRR, no murmur RESP-CTAB ABD-NABS,soft,NT,ND EXT- No edema Pulses- Radial, DP- 2+        Assessment & Plan:      Problem List Items Addressed This Visit      Unprioritized   Benign prostatic hyperplasia    Continue flomax, symptoms controlled with meds      Essential hypertension - Primary    Well controlled no changes      Relevant Orders   CBC with Differential/Platelet   Comprehensive metabolic panel   Hyperlipidemia    Continue tricor, return for fasting labs      Relevant Orders   Lipid panel    Other Visit Diagnoses    Prostate cancer screening       overdue for PSA will obtain with labs   Relevant Orders   PSA      Note: This dictation was prepared with Dragon dictation along with smaller phrase technology. Any transcriptional errors that result from this process are unintentional.

## 2017-11-08 NOTE — Patient Instructions (Signed)
F/U 6 months  Return for fasting labs

## 2017-11-08 NOTE — Assessment & Plan Note (Signed)
Well controlled no changes 

## 2017-11-08 NOTE — Assessment & Plan Note (Signed)
Continue flomax, symptoms controlled with meds

## 2017-11-08 NOTE — Assessment & Plan Note (Signed)
Continue tricor, return for fasting labs

## 2017-11-15 ENCOUNTER — Other Ambulatory Visit: Payer: Commercial Managed Care - PPO

## 2017-11-29 ENCOUNTER — Other Ambulatory Visit: Payer: Commercial Managed Care - PPO

## 2017-11-29 DIAGNOSIS — E782 Mixed hyperlipidemia: Secondary | ICD-10-CM

## 2017-11-29 DIAGNOSIS — Z125 Encounter for screening for malignant neoplasm of prostate: Secondary | ICD-10-CM

## 2017-11-29 DIAGNOSIS — I10 Essential (primary) hypertension: Secondary | ICD-10-CM

## 2017-11-30 LAB — LIPID PANEL
Cholesterol: 236 mg/dL — ABNORMAL HIGH (ref <200)
HDL: 58 mg/dL (ref >40)
LDL Cholesterol (Calc): 152 mg/dL (calc) — ABNORMAL HIGH
NON-HDL CHOLESTEROL (CALC): 178 mg/dL — AB (ref <130)
TRIGLYCERIDES: 131 mg/dL (ref <150)
Total CHOL/HDL Ratio: 4.1 (calc) (ref <5.0)

## 2017-11-30 LAB — COMPREHENSIVE METABOLIC PANEL
AG RATIO: 1.2 (calc) (ref 1.0–2.5)
ALT: 10 U/L (ref 9–46)
AST: 15 U/L (ref 10–35)
Albumin: 4.2 g/dL (ref 3.6–5.1)
Alkaline phosphatase (APISO): 67 U/L (ref 40–115)
BUN: 12 mg/dL (ref 7–25)
CO2: 28 mmol/L (ref 20–32)
Calcium: 10 mg/dL (ref 8.6–10.3)
Chloride: 104 mmol/L (ref 98–110)
Creat: 1.19 mg/dL (ref 0.70–1.25)
GLUCOSE: 111 mg/dL — AB (ref 65–99)
Globulin: 3.5 g/dL (calc) (ref 1.9–3.7)
Potassium: 4.5 mmol/L (ref 3.5–5.3)
Sodium: 137 mmol/L (ref 135–146)
Total Bilirubin: 0.4 mg/dL (ref 0.2–1.2)
Total Protein: 7.7 g/dL (ref 6.1–8.1)

## 2017-11-30 LAB — CBC WITH DIFFERENTIAL/PLATELET
Basophils Absolute: 68 cells/uL (ref 0–200)
Basophils Relative: 1.1 %
EOS ABS: 149 {cells}/uL (ref 15–500)
EOS PCT: 2.4 %
HCT: 43.7 % (ref 38.5–50.0)
Hemoglobin: 15 g/dL (ref 13.2–17.1)
Lymphs Abs: 2294 cells/uL (ref 850–3900)
MCH: 32.3 pg (ref 27.0–33.0)
MCHC: 34.3 g/dL (ref 32.0–36.0)
MCV: 94.2 fL (ref 80.0–100.0)
MONOS PCT: 7.2 %
MPV: 11.4 fL (ref 7.5–12.5)
Neutro Abs: 3243 cells/uL (ref 1500–7800)
Neutrophils Relative %: 52.3 %
PLATELETS: 176 10*3/uL (ref 140–400)
RBC: 4.64 10*6/uL (ref 4.20–5.80)
RDW: 13.1 % (ref 11.0–15.0)
TOTAL LYMPHOCYTE: 37 %
WBC mixed population: 446 cells/uL (ref 200–950)
WBC: 6.2 10*3/uL (ref 3.8–10.8)

## 2017-11-30 LAB — PSA: PSA: 1.9 ng/mL (ref <=4.0)

## 2018-03-10 ENCOUNTER — Other Ambulatory Visit: Payer: Self-pay | Admitting: Family Medicine

## 2018-03-10 ENCOUNTER — Emergency Department (HOSPITAL_COMMUNITY)
Admission: EM | Admit: 2018-03-10 | Discharge: 2018-03-10 | Disposition: A | Discharge status: Home / Self Care | Payer: Commercial Managed Care - PPO | Attending: Emergency Medicine | Admitting: Emergency Medicine

## 2018-03-10 ENCOUNTER — Emergency Department (HOSPITAL_COMMUNITY): Payer: Commercial Managed Care - PPO

## 2018-03-10 ENCOUNTER — Encounter: Payer: Self-pay | Admitting: Family Medicine

## 2018-03-10 ENCOUNTER — Encounter (HOSPITAL_COMMUNITY): Payer: Self-pay | Admitting: Emergency Medicine

## 2018-03-10 ENCOUNTER — Ambulatory Visit (INDEPENDENT_AMBULATORY_CARE_PROVIDER_SITE_OTHER): Payer: Commercial Managed Care - PPO | Admitting: Family Medicine

## 2018-03-10 ENCOUNTER — Other Ambulatory Visit: Payer: Self-pay

## 2018-03-10 VITALS — BP 140/88 | HR 86 | Temp 98.5°F | Resp 14 | Ht 67.0 in | Wt 137.0 lb

## 2018-03-10 DIAGNOSIS — R42 Dizziness and giddiness: Secondary | ICD-10-CM

## 2018-03-10 DIAGNOSIS — I491 Atrial premature depolarization: Secondary | ICD-10-CM | POA: Diagnosis not present

## 2018-03-10 DIAGNOSIS — Z87891 Personal history of nicotine dependence: Secondary | ICD-10-CM | POA: Insufficient documentation

## 2018-03-10 DIAGNOSIS — Z79899 Other long term (current) drug therapy: Secondary | ICD-10-CM | POA: Diagnosis not present

## 2018-03-10 DIAGNOSIS — I10 Essential (primary) hypertension: Secondary | ICD-10-CM | POA: Diagnosis not present

## 2018-03-10 DIAGNOSIS — R630 Anorexia: Secondary | ICD-10-CM | POA: Diagnosis not present

## 2018-03-10 DIAGNOSIS — R5381 Other malaise: Secondary | ICD-10-CM

## 2018-03-10 DIAGNOSIS — R002 Palpitations: Secondary | ICD-10-CM | POA: Diagnosis not present

## 2018-03-10 LAB — TROPONIN I: Troponin I: <0.03 ng/mL (ref <0.03)

## 2018-03-10 LAB — URINALYSIS, ROUTINE W REFLEX MICROSCOPIC
BILIRUBIN URINE: NEGATIVE
GLUCOSE, UA: NEGATIVE mg/dL
Hgb urine dipstick: NEGATIVE
Ketones, ur: NEGATIVE mg/dL
Leukocytes, UA: NEGATIVE
Nitrite: NEGATIVE
PROTEIN: NEGATIVE mg/dL
SPECIFIC GRAVITY, URINE: 1.008 (ref 1.005–1.030)
pH: 5 (ref 5.0–8.0)

## 2018-03-10 LAB — COMPREHENSIVE METABOLIC PANEL
ALBUMIN: 4.3 g/dL (ref 3.5–5.0)
ALK PHOS: 71 U/L (ref 38–126)
ALT: 12 U/L (ref 0–44)
AST: 20 U/L (ref 15–41)
Anion gap: 9 (ref 5–15)
BUN: 11 mg/dL (ref 8–23)
CALCIUM: 10 mg/dL (ref 8.9–10.3)
CO2: 25 mmol/L (ref 22–32)
CREATININE: 1.1 mg/dL (ref 0.61–1.24)
Chloride: 102 mmol/L (ref 98–111)
GFR calc Af Amer: >60 mL/min (ref >60)
GFR calc non Af Amer: >60 mL/min (ref >60)
GLUCOSE: 103 mg/dL — AB (ref 70–99)
Potassium: 4.2 mmol/L (ref 3.5–5.1)
SODIUM: 136 mmol/L (ref 135–145)
Total Bilirubin: 0.7 mg/dL (ref 0.3–1.2)
Total Protein: 8.7 g/dL — ABNORMAL HIGH (ref 6.5–8.1)

## 2018-03-10 LAB — CBC WITH DIFFERENTIAL/PLATELET
Abs Immature Granulocytes: 0.04 10*3/uL (ref 0.00–0.07)
BASOS ABS: 0.1 10*3/uL (ref 0.0–0.1)
Basophils Relative: 1 %
EOS PCT: 1 %
Eosinophils Absolute: 0.1 10*3/uL (ref 0.0–0.5)
HEMATOCRIT: 46.7 % (ref 39.0–52.0)
HEMOGLOBIN: 15.5 g/dL (ref 13.0–17.0)
Immature Granulocytes: 0 %
LYMPHS PCT: 19 %
Lymphs Abs: 1.7 10*3/uL (ref 0.7–4.0)
MCH: 31.1 pg (ref 26.0–34.0)
MCHC: 33.2 g/dL (ref 30.0–36.0)
MCV: 93.8 fL (ref 80.0–100.0)
MONO ABS: 0.6 10*3/uL (ref 0.1–1.0)
Monocytes Relative: 7 %
Neutro Abs: 6.5 10*3/uL (ref 1.7–7.7)
Neutrophils Relative %: 72 %
Platelets: 192 10*3/uL (ref 150–400)
RBC: 4.98 MIL/uL (ref 4.22–5.81)
RDW: 13.1 % (ref 11.5–15.5)
WBC: 9.1 10*3/uL (ref 4.0–10.5)
nRBC: 0 % (ref 0.0–0.2)

## 2018-03-10 LAB — TSH: TSH: 2.133 u[IU]/mL (ref 0.350–4.500)

## 2018-03-10 LAB — MAGNESIUM: Magnesium: 1.9 mg/dL (ref 1.7–2.4)

## 2018-03-10 MED ORDER — MELOXICAM 7.5 MG PO TABS: 7.5000 mg | ORAL_TABLET | Freq: Two times a day (BID) | ORAL | 0 refills | Status: AC | PRN

## 2018-03-10 MED ORDER — SODIUM CHLORIDE 0.9 % IV SOLN
INTRAVENOUS | Status: DC
  Administered 2018-03-10: 19:00:00 via INTRAVENOUS

## 2018-03-10 NOTE — ED Notes (Signed)
Patient transported to X-ray 

## 2018-03-10 NOTE — Discharge Instructions (Signed)
Your testing this evening was unremarkable, the CT scan of your brain, the x-ray and the blood work did not show any abnormalities.  There was no signs of heart attack, no signs of stroke, no signs of infections.  It does appear that she may have had some slight dehydration but we have given you some IV fluids. Over the next 2 days please stay home, hydrate aggressively You should follow-up with the cardiologist listed above as you may need Holter monitor testing to monitor your heart for irregular heart rhythms. If you should develop any severe or worsening symptoms please return to the emergency department You may take Mobic, 1 tablet by mouth twice a day as needed for headache

## 2018-03-10 NOTE — ED Triage Notes (Signed)
Pt reports HTN and dizziness for over a week. Is currently on BP medications and denies missed doses. Hx of vertigo but states this is different.

## 2018-03-10 NOTE — ED Provider Notes (Signed)
[facility] EMERGENCY DEPARTMENT Provider Note   CSN: [id number] Arrival date & time: 03/10/18  1730     History   Chief Complaint Chief Complaint  Patient presents with  . Hypertension    HPI Kyle Rivers is a 62 y.o. male.  HPI  The patient is a 62 year old male, he has a known history of hypertension, hyperlipidemia, has been diagnosed with left ventricular hypertrophy as a variant, his EKG has shown ST elevation in the past but he has had a normal heart catheterization in 2009.  He presents today with a complaint of lightheadedness.  This is associated with a feeling of near syncope, he has occasional palpitations and nausea associated with this.  He has not passed out, he does not feel vertigo, he does not have tenderness, he has no earache but has had some right-sided head heaviness over the last week and a half or so.  Ultimately the patient has noted that his blood pressure has been creeping up over the last couple of weeks, it is normally 118/75 in the office and on 5 mg of Norvasc he has been very well controlled but over the last week has noticed blood pressures as high as 160-180 which he correlates with his symptoms as well.  He has noted some intermittent left eye blurry vision, he denies any chest pain, denies shortness of breath coughing fevers chills vomiting or diarrhea but has had some nausea.  His symptoms seem to go away when he lays down but are much worse when he is standing up or walking.  His appetite is been normal.  He does report a history of prior syncope with vertigo which time he was told he was severely dehydrated.  Patient denies any caffeine intake, he drinks decaffeinated coffee, takes no over-the-counter medications, uses no drugs, drinks no alcohol and smokes no cigarettes.  He has not smoked in over one year.  His wife who is an additional historian corroborates the patient's story, medical record review corroborates the patient's history of left  ventricular hypertrophy  Past Medical History:  Diagnosis Date  . BPH (benign prostatic hypertrophy)   . 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 Ear infection 08/12/2016   pt believes he may be coming down with an ear infection today  . Hemorrhoids   . Hyperlipidemia   . Hypertension   . LVH (left ventricular hypertrophy)    LVH variant, EKG show ST elevation, normal cardiac catherization 2009  . Prostatitis     Patient Active Problem List   Diagnosis Date Noted  . Guaiac positive stools 08/26/2016  . Cervical spinal stenosis 08/12/2016  . Carotid artery disease ([place]) 07/28/2016  . Spinal stenosis of cervical region 07/27/2016  . 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 Benign prostatic hyperplasia 07/31/2013  . Abdominal pain, unspecified site 06/25/2013  . Scrotal pain 06/25/2013  . ED (erectile dysfunction) 11/29/2012  . Hyperlipidemia 04/05/2006  . Essential hypertension 04/05/2006  . ABNORMAL HEART RHYTHMS 04/05/2006  . LOW BACK PAIN 04/05/2006    Past Surgical History:  Procedure Laterality Date  . ANTERIOR CERVICAL DECOMP/DISCECTOMY FUSION N/A 08/12/2016   Procedure: C5-6, C6-7 ANTERIOR CERVICAL DECOMPRESSION/DISCECTOMY FUSION 2 LEVELS, ALLOGRAFT, PLATE;  Surgeon: Marybelle Killings, MD;  Location: [place];  Service: Orthopedics;  Laterality: N/A;  . CARDIAC CATHETERIZATION    . CERVICAL DISC SURGERY  08/13/2015   c 5 c6 c7  . COLONOSCOPY    .  COLONOSCOPY N/A 10/02/2016   Procedure: COLONOSCOPY;  Surgeon: Rogene Houston, MD;  [facility];  Service: Endoscopy;  Laterality: N/A;  12:45  . HEMORRHOID SURGERY    . POLYPECTOMY  10/02/2016   Procedure: POLYPECTOMY;  Surgeon: Rogene Houston, MD;  Location: [facility];  Service: Endoscopy;;  colon  . UPPER GASTROINTESTINAL ENDOSCOPY          Home Medications    Prior to Admission medications   Medication Sig Start Date End Date Taking? Authorizing  Provider  amLODipine (NORVASC) 10 MG tablet Take 1 tablet (10 mg total) by mouth daily. 11/08/17  Yes [place], Modena Nunnery, MD  Co-Enzyme Q-10 100 MG CAPS Take 1 capsule by mouth daily.   Yes [provider]  fenofibrate (TRICOR) 48 MG tablet Take 1 tablet (48 mg total) by mouth daily. 11/08/17  Yes [place], Modena Nunnery, MD  fish oil-omega-3 fatty acids 1000 MG capsule Take 2 g by mouth daily.    Yes [provider]  Menthol-Methyl Salicylate (MUSCLE RUB) 10-15 % CREA Apply 1 application topically as needed for muscle pain.   Yes [provider]  naphazoline-glycerin (CLEAR EYES REDNESS) 0.012-0.2 % SOLN Place 2 drops into both eyes 4 (four) times daily as needed for eye irritation.   Yes [provider]  OVER THE COUNTER MEDICATION See admin instructions. Genius brand Digestion Opitimizer. Take 3-4 capsules daily.   Yes [provider]  pantoprazole (PROTONIX) 40 MG tablet Take 1 tablet (40 mg total) by mouth daily. 11/08/17  Yes [place], Modena Nunnery, MD  Probiotic Product (ACIDOPHILUS/BIFIDUS PO) Take 1 capsule by mouth daily.   Yes [provider]  tamsulosin (FLOMAX) 0.4 MG CAPS capsule Take 1 capsule (0.4 mg total) by mouth daily. 11/08/17  Yes [place], Modena Nunnery, MD  TURMERIC PO Take 1,000 mg by mouth daily.    Yes [provider]  meloxicam (MOBIC) 7.5 MG tablet Take 1 tablet (7.5 mg total) by mouth 2 (two) times daily as needed for up to 7 days for pain (headache / pain). 03/10/18 03/17/18  Noemi Chapel, MD    Family History Family History  Problem Relation Age of Onset  . Stroke Mother   . Hypertension Father   . Diabetes Father   . Prostate cancer Father   . Diabetes Sister   . Diabetes Sister     Social History Social History   Tobacco Use  . Smoking status: Former Smoker    Packs/day: 0.50    Years: 30.00    Pack years: 15.00    Types: Cigarettes  . Smokeless tobacco: Former Systems developer  . Tobacco comment: smokes light, somedays  none  Substance Use Topics  . Alcohol use: Yes    Comment: Occ  . Drug use: No     Allergies   Oxycodone and Lipitor [atorvastatin]   Review of Systems Review of Systems  All other systems reviewed and are negative.    Physical Exam Updated Vital Signs BP 125/81   Pulse 69   Temp 98.6 F (37 C) (Oral)   Resp 14   Ht 1.702 m (5\' 7" )   Wt 63.5 kg   SpO2 100%   BMI 21.93 kg/m   Physical Exam  Constitutional: He appears well-developed and well-nourished. No distress.  HENT:  Head: Normocephalic and atraumatic.  Mouth/Throat: Oropharynx is clear and moist. No oropharyngeal exudate.  Tenderness over the right side of the scalp where the headache is, no malocclusion, clear oropharynx  Eyes: Pupils are equal, round, and reactive to light. Conjunctivae and EOM are normal. Right eye exhibits no discharge. Left eye exhibits no discharge. No scleral icterus.  Neck: Normal range of motion. Neck supple. No JVD present. No thyromegaly present.  Cardiovascular: Normal rate, regular rhythm, normal heart sounds and intact distal pulses. Exam reveals no gallop and no friction rub.  No murmur heard. Borderline sinus tachycardia with a rate of 98-102, there are frequent PACs and occasional PVCs, normal pulses, no JVD  Pulmonary/Chest: Effort normal and breath sounds normal. No respiratory distress. He has no wheezes. He has no rales.  Abdominal: Soft. Bowel sounds are normal. He exhibits no distension and no mass. There is no tenderness.  Soft and nontender abdomen  Musculoskeletal: Normal range of motion. He exhibits no edema or tenderness.  Lymphadenopathy:    He has no cervical adenopathy.  Neurological: He is alert. Coordination normal.  The patient is able to follow commands, he can perform finger-nose-finger, clear speech, cranial nerves III through XII are normal, vision is grossly normal, he is able to lift all 4 extremities with normal strength, normal grips, normal gait, normal  memory  Skin: Skin is warm and dry. No rash noted. No erythema.  Psychiatric: He has a normal mood and affect. His behavior is normal.  Nursing note and vitals reviewed.    ED Treatments / Results  Labs (all labs ordered are listed, but only abnormal results are displayed) Labs Reviewed  COMPREHENSIVE METABOLIC PANEL - Abnormal; Notable for the following components:      Result Value   Glucose, Bld 103 (*)    Total Protein 8.7 (*)    All other components within normal limits  CBC WITH DIFFERENTIAL/PLATELET  URINALYSIS, ROUTINE W REFLEX MICROSCOPIC  TROPONIN I  MAGNESIUM  TSH    EKG EKG Interpretation  Date/Time:  Thursday March 10 2018 17:41:26 EDT Ventricular Rate:  92 PR Interval:    QRS Duration: 87 QT Interval:  306 QTC Calculation: 379 R Axis:   85 Text Interpretation:  Sinus tachycardia Atrial premature complexes Biatrial enlargement Borderline right axis deviation Probable LVH with secondary repol abnrm Repol abnrm, global ischemia, diffuse leads Anterior ST elevation, probably due to LVH occasional PAC's, otherwise unchanged EKG Confirmed by Noemi Chapel [phone]) on 03/10/2018 6:56:21 PM   Radiology Dg Chest 2 View  Result Date: 03/10/2018 CLINICAL DATA:  Hypertension and dizziness.  Shortness of breath. EXAM: CHEST - 2 VIEW COMPARISON:  August 12, 2016 FINDINGS: The heart size and mediastinal contours are within normal limits. Both lungs are clear. The visualized skeletal structures are unremarkable. IMPRESSION: No active cardiopulmonary disease. Electronically Signed   By: Dorise Bullion III M.D   On: 03/10/2018 18:32   Ct Head Wo Contrast  Result Date: 03/10/2018 CLINICAL DATA:  Hypertension and dizziness for over a week. History of vertigo. EXAM: CT HEAD WITHOUT CONTRAST TECHNIQUE: Contiguous axial images were obtained from the base of the skull through the vertex without intravenous contrast. COMPARISON:  Head CT dated 07/22/2009. FINDINGS: Brain: Ventricles  are within normal limits in size. All areas of the brain demonstrate appropriate gray-white matter differentiation. There is no mass, hemorrhage, edema or other evidence of acute parenchymal abnormality. No extra-axial hemorrhage. Vascular: No hyperdense vessel or unexpected calcification. Skull: Normal. Negative for fracture or focal lesion. Sinuses/Orbits: No acute finding. Other: None. IMPRESSION: Negative head CT.  No intracranial mass, hemorrhage or edema. Electronically Signed   By: Franki Cabot M.D.   On: 03/10/2018  19:24    Procedures Procedures (including critical care time)  Medications Ordered in ED Medications  0.9 %  sodium chloride infusion ( Intravenous New Bag/Given 03/10/18 1908)     Initial Impression / Assessment and Plan / ED Course  I have reviewed the triage vital signs and the nursing notes.  Pertinent labs & imaging results that were available during my care of the patient were reviewed by me and considered in my medical decision making (see chart for details).  Clinical Course as of Mar 10 2021  Thu Mar 10, 2018  1324 I have personally viewed the two-view PA and lateral chest x-ray, there is no signs of acute infiltrates or abnormalities in the chest.   [BM]    Clinical Course User Index [BM] Noemi Chapel, MD    It is unclear exactly what is causing the patient's symptoms however he has some very abnormal complaints including his head symptoms, his feeling of lightheadedness and near syncope, his feeling of palpitations with the presence of ectopy on the monitor.  We will perform an EKG, labs and a CT scan of the brain to make sure there is nothing else causing his symptoms.  He may be dehydrated, this may be electrolyte related, it may be arrhythmia.  CT and labs negative, has slight changes in VS in position but no tachycardia - no fevers, normal MS and unremakable Neuro exam for me - pt informed and updated and ina greement for follow up for holter monitor  testing.  Final Clinical Impressions(s) / ED Diagnoses   Final diagnoses:  Light headedness  Premature atrial beat    ED Discharge Orders         Ordered    meloxicam (MOBIC) 7.5 MG tablet  2 times daily PRN     03/10/18 2021           Noemi Chapel, MD 03/10/18 2022

## 2018-03-10 NOTE — Addendum Note (Signed)
Addended by: Delsa Grana on: 03/10/2018 05:28 PM   Modules accepted: Orders

## 2018-03-10 NOTE — ED Notes (Signed)
Advised patient we needed urine specimen.  Urinal provided.

## 2018-03-10 NOTE — Progress Notes (Addendum)
Patient ID: KHARY SCHABEN, male    DOB: [birth year], 62 y.o.   MRN: [id number]  PCP: Alycia Rossetti, MD  Chief Complaint  Patient presents with  . Dizziness    states that he is haivng dizziness when he stands up from lying down- noted pressure on R sid eof head and elevaated BP    Subjective:   GABE GLACE is a 62 y.o. male, presents to clinic with CC of dizziness/vertigo, constant, gradually worsening for 1 month becoming more constant and more severe with associated right head symptoms described as start of indigestion and stomach ache in the right side of his head.  Symptoms are much worse when he stands up. Also feels forceful contractions, racing heart and palpitations associated with any mild exertion now with standing up after sitting for short period of time. Vertigo, right head sensation, described like he feels nausea anxiousness and vertigo in his stomach in his chest and it radiates to his right head.  He has had gradually worsening vertigo episodes that have become constant and are much worse with going from sitting to standing.  He had some dehydration with near syncope before and it feels somewhat like that when he stands up but is also had some vertigo that was very brief and associated with head movements and it does not feel similar to that.  Having some blurry vision and visual disturbances.  Has decreased appetite.  He has rapid heart rate and can feel forceful contraction pulse in his chest and his stomach and his neck and in his head and ears.   He feels generally just very unwell, yesterday felt so bad that he was can go to the ER but he was able to get an appointment here today.  History of hyperlipidemia, hypertension, former smoker with at least 40-pack-year history quit smoking 1 year ago On his chart there is a diagnosis of coronary artery disease, I can only find carotid artery duplex which showed bilateral disease less than 50% EKG from 08/12/2016 was  reviewed -showed biatrial enlargement, LVH, inverted T waves in V5 and V6, he states that he was sent to the ER for evaluation relative to an abnormal EKG and he was ruled out for any cardiac event, I cannot find the records or labs related to this  A 40-pack-year history of smoking quit approximately 1 year ago Also has a history of hypertension and hyperlipidemia  He denies any abdominal pain, diarrhea, vomiting, melena or hematochezia He feels similar to another time when he went to the ER where he had some dizziness and ended up being dehydrated, he has been pushing lots of clear liquids and urinating much more without difficulty but he has not felt any better in fact he has been feeling worse.  Last seen by his PCP for routine f/up of HTN/HLD, Dr. Buelah Manis, current tx was continued with 6 month f/up, tx with amlodipine and tricor  BP Readings from Last 3 Encounters:  03/10/18 140/88  11/08/17 112/68  10/02/16 121/69    Fasting sugar was mildly elevated 111, no record of A1C that I can find Last Lipid Panel:    Component Value Date/Time   CHOL 236 (H) 11/29/2017 1416   TRIG 131 11/29/2017 1416   HDL 58 11/29/2017 1416   CHOLHDL 4.1 11/29/2017 1416   VLDL 11 07/28/2016 1142   LDLCALC 152 (H) 11/29/2017 1416   Wt Readings from Last 3 Encounters:  03/10/18 137 lb (62.1 kg)  11/08/17 138 lb (62.6 kg)  11/05/16 140 lb (63.5 kg)      Patient Active Problem List   Diagnosis Date Noted  . Guaiac positive stools 08/26/2016  . Cervical spinal stenosis 08/12/2016  . Carotid artery disease ([place]) 07/28/2016  . Spinal stenosis of cervical region 07/27/2016  . 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 Benign prostatic hyperplasia 07/31/2013  . Abdominal pain, unspecified site 06/25/2013  . Scrotal pain 06/25/2013  . ED (erectile dysfunction) 11/29/2012  . Hyperlipidemia 04/05/2006  . Essential hypertension 04/05/2006  . ABNORMAL HEART RHYTHMS  04/05/2006  . LOW BACK PAIN 04/05/2006     Prior to Admission medications   Medication Sig Start Date End Date Taking? Authorizing Provider  amLODipine (NORVASC) 10 MG tablet Take 1 tablet (10 mg total) by mouth daily. 11/08/17  Yes Rio, Modena Nunnery, MD  docusate sodium (COLACE) 100 MG capsule Take 100 mg by mouth 2 (two) times daily.   Yes [provider]  fenofibrate (TRICOR) 48 MG tablet Take 1 tablet (48 mg total) by mouth daily. 11/08/17  Yes Epps, Modena Nunnery, MD  fish oil-omega-3 fatty acids 1000 MG capsule Take 2 g by mouth daily.    Yes [provider]  Menthol-Methyl Salicylate (MUSCLE RUB) 10-15 % CREA Apply 1 application topically as needed for muscle pain.   Yes [provider]  pantoprazole (PROTONIX) 40 MG tablet Take 1 tablet (40 mg total) by mouth daily. 11/08/17  Yes Bern, Modena Nunnery, MD  tamsulosin (FLOMAX) 0.4 MG CAPS capsule Take 1 capsule (0.4 mg total) by mouth daily. 11/08/17  Yes [place], Modena Nunnery, MD  TURMERIC PO Take 1,000 mg by mouth daily.    Yes [provider]     Allergies  Allergen Reactions  . Oxycodone     " makes me sick with nausea "  . Lipitor [Atorvastatin] Rash and Other (See Comments)    "Bumps showed up on skin "     Family History  Problem Relation Age of Onset  . Stroke Mother   . Hypertension Father   . Diabetes Father   . Prostate cancer Father   . Diabetes Sister   . Diabetes Sister      Social History   Socioeconomic History  . Marital status: Married    Spouse name: Not on file  . Number of children: Not on file  . Years of education: Not on file  . Highest education level: Not on file  Occupational History  . Not on file  Social Needs  . Financial resource strain: Not on file  . Food insecurity:    Worry: Not on file    Inability: Not on file  . Transportation needs:    Medical: Not on file    Non-medical: Not on file  Tobacco Use  . Smoking status: Former Smoker    Packs/day:  0.50    Years: 30.00    Pack years: 15.00    Types: Cigarettes  . Smokeless tobacco: Former Systems developer  . Tobacco comment: smokes light, somedays none  Substance and Sexual Activity  . Alcohol use: Yes    Comment: Occ  . Drug use: No  . Sexual activity: Yes  Lifestyle  . Physical activity:    Days per week: Not on file    Minutes per session: Not on file  . Stress: Not on file  Relationships  . Social connections:    Talks on phone: Not on file  Gets together: Not on file    Attends religious service: Not on file    Active member of club or organization: Not on file    Attends meetings of clubs or organizations: Not on file    Relationship status: Not on file  . Intimate partner violence:    Fear of current or ex partner: Not on file    Emotionally abused: Not on file    Physically abused: Not on file    Forced sexual activity: Not on file  Other Topics Concern  . Not on file  Social History Narrative  . Not on file     Review of Systems  Constitutional: Positive for activity change, appetite change, diaphoresis and fatigue. Negative for fever and unexpected weight change.  HENT: Negative.   Eyes: Negative.   Respiratory: Negative.  Negative for choking, chest tightness, shortness of breath, wheezing and stridor.   Cardiovascular: Positive for palpitations. Negative for chest pain and leg swelling.  Gastrointestinal: Negative.  Negative for abdominal distention, abdominal pain, blood in stool, diarrhea and vomiting.  Endocrine: Negative.   Genitourinary: Negative.   Musculoskeletal: Negative.   Skin: Negative.   Allergic/Immunologic: Negative.   Neurological: Negative.   Hematological: Negative.   Psychiatric/Behavioral: Negative.   All other systems reviewed and are negative.      Objective:    Vitals:   03/10/18 1608  BP: 140/88  Pulse: 86  Resp: 14  Temp: 98.5 F (36.9 C)  TempSrc: Oral  SpO2: 97%  Weight: 137 lb (62.1 kg)  Height: 5\' 7"  (1.702 m)       Physical Exam  Constitutional: He is oriented to person, place, and time. He appears well-developed. No distress.  Thin male, alert, mildly anxious  HENT:  Head: Normocephalic and atraumatic.  Right Ear: External ear normal.  Left Ear: External ear normal.  Nose: Nose normal.  Mouth/Throat: Oropharynx is clear and moist.  Eyes: Conjunctivae are normal. Right eye exhibits no discharge. Left eye exhibits no discharge.  Neck: Trachea normal and phonation normal. JVD present. Carotid bruit is not present. No tracheal deviation present.  Cardiovascular: Normal rate and regular rhythm. Frequent extrasystoles are present. PMI is displaced. Exam reveals no gallop and no friction rub.  No murmur heard. Pulses:      Radial pulses are 2+ on the right side, and 2+ on the left side.       Dorsalis pedis pulses are 2+ on the right side, and 2+ on the left side.       Posterior tibial pulses are 2+ on the right side, and 2+ on the left side.  No LE edema  Pulmonary/Chest: Effort normal. No stridor. No respiratory distress.  Abdominal: Normal appearance and bowel sounds are normal. He exhibits pulsatile midline mass. He exhibits no abdominal bruit. There is no tenderness. There is no rigidity, no rebound, no guarding and no CVA tenderness.  Musculoskeletal: Normal range of motion.  Neurological: He is alert and oriented to person, place, and time. He exhibits normal muscle tone. Coordination normal.  MENTAL STATUS: AAOx3, memory intact, fund of knowledge appropriate  LANG/SPEECH: Naming and repetition intact, fluent, follows 3-step commands  CRANIAL NERVES:   II: Pupils equal and reactive, no RAPD   III, IV, VI: EOM intact, no gaze preference or deviation, no nystagmus.   V: normal sensation in V1, V2, and V3 segments bilaterally   VII: no asymmetry, no nasolabial fold flattening   VIII: normal hearing to speech   IX,  X: normal palatal elevation, no uvular deviation   XI: 5/5 head turn and 5/5  shoulder shrug bilaterally   XII: midline tongue protrusion  MOTOR:  5/5 bilateral grip strength 5/5 strength dorsiflexion/plantarflexion b/l  SENSORY:  Normal to light touch + Romberg - mild swaying  COORD: Normal finger to nose, no tremor, no dysmetria  STATION: normal stance, no truncal ataxia  GAIT: Normal; patient able to tip-toe  No pronator drift  Skin: Skin is warm. No rash noted. He is diaphoretic.  Psychiatric: He has a normal mood and affect. His behavior is normal.  Nursing note and vitals reviewed.   EKG:  Sinus rhythm with frequent PAC's, LVH, TWI in V5 and V6, biphasic twave in V4, T-wave abnormalities not new, LVH not new when compared to 08/12/16 EKG       Assessment & Plan:      ICD-10-CM   1. Orthostatic lightheadedness R42 EKG 12-Lead    CANCELED: COMPLETE METABOLIC PANEL WITH GFR    CANCELED: CBC with Differential    CANCELED: Urinalysis, Routine w reflex microscopic  2. Vertigo R42   3. Malaise R53.81   4. Anorexia R63.0   5. Palpitations R00.2     ekg change -palpitations and vertigo, severe and gradually worsening for 1 month with some orthostatic changes -   Feel he needs ER work up for ACS and rule out central vertigo, needs labs to assess hydration status, kidney function, CHF?    Nonfocal neurological evaluation -no identified deficit  He is orthostatic but not hypotensive, does become slightly clammy and loss of balance - having active symptoms -near syncopal with going from sitting to standing  He has visible pulse in his abdomen and JVD I do not hear any bruits carotid arteries or abdomen I do not hear any murmur and his heart, rhythm is regular with occasional premature beats, displaced PMI, no M, G  Denies melena, hematochezia.  Has some indigestion and N, for the last month has been associated with vertigo symptoms  EKG shows sinus rhythm with frequent PACs and some T wave abnormalities but T wave inversions are not new when compared  to past EKG from 08/12/16.  Feel he needs lab work and results more emergently than I can provide at the end of clinic day at 5 PM to rule out dehydration, electrolyte normalities, he states he has been drinking and urinating try to push fluids but he has lost his appetite, oral mucosa is moist is very thin overall appears euvolemic I do not know why he would be dropping his blood pressure 30 mmHg.  I consulted Dr. Dennard Schaumann who has reviewed his EKG with me and is currently evaluating the patient, I have discussed prior to this with the patient that I feel like the ER would give him a timely evaluation to rule out anything emergent and that we can follow-up afterwards and address identified issues outpt.    Most recent labs reviewed 11/29/17 -mildly elevated glucose, normal renal function, normal liver function, elevated cholesterol, no anemia Over a year ago he had colonoscopy with multiple benign polyps, residual hemorrhoid skin tags and no other abnormalities  Did have coronary duplex scan March 2018, right carotid atherosclerosis stenosis less than 50% and left carotid bifurcation proximal ICA atherosclerosis less than 50% vertebral arteries patent antegrade flow  Has been on TriCor and amlodipine   Delsa Grana, PA-C 03/10/18 4:19 PM    Pt sent to ER - report called to Sarah, pt in agreement  with plan feel EKG and VS stable enough for POV

## 2018-03-10 NOTE — Progress Notes (Signed)
Orthostatic Blood Pressure:  Lying:   148/82  HR: 98  O2: 98  Sitting:  154/92  HR: 102 O2: 98  Standing:  138/82  HR: 98  O2: 99  Standing x2:  118/ 82 HR: 118 O2: 98

## 2018-03-14 ENCOUNTER — Encounter: Payer: Self-pay | Admitting: Cardiology

## 2018-03-14 ENCOUNTER — Ambulatory Visit (INDEPENDENT_AMBULATORY_CARE_PROVIDER_SITE_OTHER): Payer: Commercial Managed Care - PPO | Admitting: Cardiology

## 2018-03-14 VITALS — BP 148/80 | HR 97 | Ht 67.0 in | Wt 137.0 lb

## 2018-03-14 DIAGNOSIS — R079 Chest pain, unspecified: Secondary | ICD-10-CM

## 2018-03-14 DIAGNOSIS — R002 Palpitations: Secondary | ICD-10-CM

## 2018-03-14 DIAGNOSIS — R5383 Other fatigue: Secondary | ICD-10-CM | POA: Diagnosis not present

## 2018-03-14 DIAGNOSIS — I517 Cardiomegaly: Secondary | ICD-10-CM

## 2018-03-14 DIAGNOSIS — R0602 Shortness of breath: Secondary | ICD-10-CM

## 2018-03-14 DIAGNOSIS — Z7189 Other specified counseling: Secondary | ICD-10-CM

## 2018-03-14 DIAGNOSIS — I493 Ventricular premature depolarization: Secondary | ICD-10-CM

## 2018-03-14 DIAGNOSIS — R011 Cardiac murmur, unspecified: Secondary | ICD-10-CM

## 2018-03-14 NOTE — Patient Instructions (Addendum)
Medication Instructions:  Your Physician recommend you continue on your current medication as directed.    If you need a refill on your cardiac medications before your next appointment, please call your pharmacy.   Lab work: None   Testing/Procedures: Your physician has requested that you have an echocardiogram. Echocardiography is a painless test that uses sound waves to create images of your heart. It provides your doctor with information about the size and shape of your heart and how well your heart's chambers and valves are working. This procedure takes approximately one hour. There are no restrictions for this procedure. [facility]  Our physician has recommended that you wear an 14  DAY ZIO-PATCH monitor. The Zio patch cardiac monitor continuously records heart rhythm data for up to 14 days, this is for patients being evaluated for multiple types heart rhythms. For the first 24 hours post application, please avoid getting the Zio monitor wet in the shower or by excessive sweating during exercise. After that, feel free to carry on with regular activities. Keep soaps and lotions away from the ZIO XT Patch.   This will be placed at our [facility] location - [address], [address].     Your physician has requested that you have en exercise stress myoview. For further information please visit [email or web address]. Please follow instruction sheet, as given. [place]: At [facility], you and your health needs are our priority.  As part of our continuing mission to provide you with exceptional heart care, we have created designated Provider Care Teams.  These Care Teams include your primary Cardiologist (physician) and Advanced Practice Providers (APPs -  Physician Assistants and Nurse Practitioners) who all work together to provide you with the care you need, when you need it. You will need a follow up appointment in 4 weeks.  Please call our office 2 months  in advance to schedule this appointment.  You may see Dr. Harrell Gave or one of the following Advanced Practice Providers on your designated Care Team:   Rosaria Ferries, PA-C . Jory Sims, DNP, ANP  Any Other Special Instructions Will Be Listed Below (If Applicable).

## 2018-03-14 NOTE — Progress Notes (Signed)
Cardiology Office Note:    Date:  03/14/2018   ID:  Kyle Rivers, DOB [birth year], MRN [id number]  PCP:  Kyle Rossetti, MD  Cardiologist:  Kyle Dresser, MD PhD  Referring MD: Kyle Rossetti, MD   Chief Complaint  Patient presents with  . Follow-up  . Headache  . Shortness of Breath  . Chest Pain    Discomfort.    History of Present Illness:    Kyle Rivers is a 62 y.o. male with a hx of hypertension, hyperlipidemia, LVH who is seen as a new consult at the request of Kyle Rivers, Kyle Nunnery, MD for the evaluation and management of fatigue, shortness of breath, palpitations, and chest/abdomen discomfort.  Lightheadedness, fatigue, shortness of breath for the last several weeks. Blood pressure has been running high more frequently as well. Recently had a knot in his abdomen/chest for about a week, got better with chamomile tea. Has had vertigo in the past, but this is a different sensation. Had upper and lower GI endoscopies in the past, was put on PPI after. Has frequent sensations of fast/hard heartbeats. No syncope. No PND, orthopnea, LE edema. All of his symptoms have worsened in the last few weeks. No clear aggravating/alleviating factors, nonexertional, no other associated symptoms.   Has been working hard in the yard, up until a few weeks ago was doing anything he wanted. Thought he might have an infection, but ruled out by PCP. Had been hot and cold intermittently. Loss of appetite, nights sweats. No melena or hematochezia. Has lost a few pounds in the last week or two.   Past Medical History:  Diagnosis Date  . BPH (benign prostatic hypertrophy)   . 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 Ear infection 08/12/2016   pt believes he may be coming down with an ear infection today  . Hemorrhoids   . Hyperlipidemia   . Hypertension   . LVH (left ventricular hypertrophy)    LVH variant, EKG show ST elevation, normal cardiac catherization 2009  .  Prostatitis     Past Surgical History:  Procedure Laterality Date  . ANTERIOR CERVICAL DECOMP/DISCECTOMY FUSION N/A 08/12/2016   Procedure: C5-6, C6-7 ANTERIOR CERVICAL DECOMPRESSION/DISCECTOMY FUSION 2 LEVELS, ALLOGRAFT, PLATE;  Surgeon: Kyle Killings, MD;  Location: [facility];  Service: Orthopedics;  Laterality: N/A;  . CARDIAC CATHETERIZATION    . CERVICAL DISC SURGERY  08/13/2015   c 5 c6 c7  . COLONOSCOPY    . COLONOSCOPY N/A 10/02/2016   Procedure: COLONOSCOPY;  Surgeon: Kyle Houston, MD;  [facility];  Service: Endoscopy;  Laterality: N/A;  12:45  . HEMORRHOID SURGERY    . POLYPECTOMY  10/02/2016   Procedure: POLYPECTOMY;  Surgeon: Kyle Houston, MD;  [facility];  Service: Endoscopy;;  colon  . UPPER GASTROINTESTINAL ENDOSCOPY      Current Medications: Current Outpatient Medications on File Prior to Visit  Medication Sig  . amLODipine (NORVASC) 10 MG tablet TAKE 1 TABLET BY MOUTH  DAILY  . Co-Enzyme Q-10 100 MG CAPS Take 1 capsule by mouth daily.  . fenofibrate (TRICOR) 48 MG tablet TAKE 1 TABLET BY MOUTH  DAILY  . fish oil-omega-3 fatty acids 1000 MG capsule Take 2 g by mouth daily.   . meloxicam (MOBIC) 7.5 MG tablet Take 1 tablet (7.5 mg total) by mouth 2 (two) times daily as needed for up to 7 days for pain (headache / pain).  Marland Kitchen  Menthol-Methyl Salicylate (MUSCLE RUB) 10-15 % CREA Apply 1 application topically as needed for muscle pain.  . naphazoline-glycerin (CLEAR EYES REDNESS) 0.012-0.2 % SOLN Place 2 drops into both eyes 4 (four) times daily as needed for eye irritation.  Marland Kitchen OVER THE COUNTER MEDICATION See admin instructions. Genius brand Digestion Opitimizer. Take 3-4 capsules daily.  . pantoprazole (PROTONIX) 40 MG tablet TAKE 1 TABLET BY MOUTH  DAILY  . Probiotic Product (ACIDOPHILUS/BIFIDUS PO) Take 1 capsule by mouth daily.  . tamsulosin (FLOMAX) 0.4 MG CAPS capsule TAKE 1 CAPSULE BY MOUTH  DAILY  . TURMERIC PO Take 1,000 mg by mouth daily.      No current facility-administered medications on file prior to visit.      Allergies:   Oxycodone and Lipitor [atorvastatin]   Social History   Socioeconomic History  . Marital status: Married    Spouse name: Not on file  . Number of children: Not on file  . Years of education: Not on file  . Highest education level: Not on file  Occupational History  . Not on file  Social Needs  . Financial resource strain: Not on file  . Food insecurity:    Worry: Not on file    Inability: Not on file  . Transportation needs:    Medical: Not on file    Non-medical: Not on file  Tobacco Use  . Smoking status: Former Smoker    Packs/day: 0.50    Years: 30.00    Pack years: 15.00    Types: Cigarettes  . Smokeless tobacco: Former Systems developer  . Tobacco comment: smokes light, somedays none  Substance and Sexual Activity  . Alcohol use: Yes    Comment: Occ  . Drug use: No  . Sexual activity: Yes  Lifestyle  . Physical activity:    Days per week: Not on file    Minutes per session: Not on file  . Stress: Not on file  Relationships  . Social connections:    Talks on phone: Not on file    Gets together: Not on file    Attends religious service: Not on file    Active member of club or organization: Not on file    Attends meetings of clubs or organizations: Not on file    Relationship status: Not on file  Other Topics Concern  . Not on file  Social History Narrative  . Not on file     Family History: The patient's family history includes Diabetes in his father, sister, and sister; Hypertension in his father; Prostate cancer in his father; Stroke in his mother.  ROS:   Please see the history of present illness.  Additional pertinent ROS:  Constitutional: Negative for chills, fever; positive for night sweats, unintentional weight loss, fatigue, loss of appetite HENT: Negative for ear pain and hearing loss.   Eyes: Negative for loss of vision and eye pain.  Respiratory: Positive for  shortness of breat. Negative for cough, sputum, wheezing.   Cardiovascular: Positive for chest/abdominal tightness, palpitations. Negative for PND, orthopnea, lower extremity edema and claudication.  Gastrointestinal: Negative for abdominal pain, melena, and hematochezia.  Genitourinary: Negative for dysuria and hematuria.  Musculoskeletal: Negative for falls and myalgias.  Skin: Negative for itching and rash.  Neurological: Negative for focal weakness, focal sensory changes and loss of consciousness.  Endo/Heme/Allergies: Does not bruise/bleed easily.    EKGs/Labs/Other Studies Reviewed:    The following studies were reviewed today: Echo 2009 SUMMARY - Overall left ventricular systolic function  was vigorous. Left    ventricular ejection fraction was estimated , range being 65    % to 70 %. There was no diagnostic evidence of left    ventricular regional wall motion abnormalities. Left    ventricular wall thickness was at the upper limits of normal.    Features were consistent with mild diastolic dysfunction. - Aortic valve thickness was mildly increased. There is restricted    excursion of the left coronary cusp. Transaortic velocity was    within the normal range. - There was mild thickening of the mitral valve. There was mild    mitral valvular regurgitation. - There was mild right ventricular hypertrophy.  Cath 09/2007 The left main coronary artery:  The main coronary artery is free of  significant disease.   The left anterior descending artery, please note that the left  descending artery gave rise to 3 diagonal branches and a large septal  perforator.  The LAD was very large in caliber with mild irregularities  but no significant obstruction.   Circumflex artery:  The circumflex artery was a moderate-sized vessel  gave rise to 2 marginal branches and 2 posterolateral branches.  There  was some irregularities, but no significant  obstruction.   The right coronary artery:  The right coronary artery was a moderate-  sized vessel that gave rise to conus branch, 2 right ventricular  branches, posterior ascending branch, and posterolateral branch.  This  vessel was free of significant disease.   The left ventricle:  The left ventriculogram performed in ROA projection  showed good wall motion with no areas of hypokinesis.  The estimated  ejection fraction was 6%.   CONCLUSION:  Normal coronary angiography and left ventricular wall  motion.   RECOMMENDATIONS:  In view of these findings, I suspect the abnormal ECG  is related to LVH, although the pattern is a little atypical.  We will  plan to cycle enzymes and if these are negative, I think, he can be  discharged tomorrow.  We will plan an outpatient echocardiogram to  evaluate him for LVH or hypertrophic cardiomyopathy.  EKG:  EKG is ordered today.  The ekg ordered today demonstrates sinus rhythm, PVC, LVH with repol abnormality, biatrial enlargement. TWI in V5-V6 seen previously  Recent Labs: 03/10/2018: ALT 12; BUN 11; Creatinine, Ser 1.10; Hemoglobin 15.5; Magnesium 1.9; Platelets 192; Potassium 4.2; Sodium 136; TSH 2.133  Recent Lipid Panel    Component Value Date/Time   CHOL 236 (H) 11/29/2017 1416   TRIG 131 11/29/2017 1416   HDL 58 11/29/2017 1416   CHOLHDL 4.1 11/29/2017 1416   VLDL 11 07/28/2016 1142   LDLCALC 152 (H) 11/29/2017 1416    Physical Exam:    VS:  BP (!) 148/80 (BP Location: Left Arm, Patient Position: Sitting, Cuff Size: Normal)   Pulse 97   Ht 5\' 7"  (1.702 m)   Wt 137 lb (62.1 kg)   BMI 21.46 kg/m     Wt Readings from Last 3 Encounters:  03/14/18 137 lb (62.1 kg)  03/10/18 140 lb (63.5 kg)  03/10/18 137 lb (62.1 kg)     GEN: Well nourished, well developed in no acute distress HEENT: Normal NECK: No JVD; No carotid bruits but faint radiating systolic murmur LYMPHATICS: No lymphadenopathy CARDIAC: regular rhythm, normal  S1 and S2, no rubs, gallops. 1/6 SEM early, didn't appreciate change with valsalva. Radial and DP pulses 2+ bilaterally. RESPIRATORY:  Clear to auscultation without rales, wheezing or rhonchi  ABDOMEN: Soft,  non-tender, non-distended MUSCULOSKELETAL:  No edema; No deformity  SKIN: Warm and dry NEUROLOGIC:  Alert and oriented x 3 PSYCHIATRIC:  Normal affect   ASSESSMENT:    1. Chest pain, unspecified type   2. SOB (shortness of breath)   3. Palpitation   4. Fatigue, unspecified type   5. Counseling on health promotion and disease prevention   6. LVH (left ventricular hypertrophy)   7. PVC (premature ventricular contraction)   8. Murmur, cardiac    PLAN:    1. Chest pain: has atypical features given location, length of time, but he does have risk factors and generalized symptoms. ASCVD risk is 18%. -treadmill nuclear as his LVH/repol makes ECG uninterpretable for treadmill only  2. Shortness of breath, LVH, PVC, cardiac murmur:  -repeat echocardiogram as this was last done in 2009  3. Palpitations, PVCs:  -14 day Zio to monitor for arrhythmia  4. Fatigue, night sweats, poor appetite, weight loss: this is concerning to me for a more troubling underlying issue. May be unrelated, but with B symptoms, if cardiac workup is unrevealing, would recommend malignancy workup (especially pancreatic) by PCP.  5. Prevention -recommend heart healthy/Mediterranean diet, with whole grains, fruits, vegetable, fish, lean meats, nuts, and olive oil. Limit salt. -recommend moderate walking, 3-5 times/week for 30-50 minutes each session. Aim for at least 150 minutes.week. Goal should be pace of 3 miles/hours, or walking 1.5 miles in 30 minutes -recommend avoidance of tobacco products. Avoid excess alcohol. -Additional risk factor control:  -Diabetes: A1c is not available  -Lipids: Tchol 236, LDL 152. ASCVD risk 18%. He would benefit from a statin, but given how poorly he feels at the moment, will avoid  adding until results return. He had a reaction to atorvastatin, so would need to consider another statin (he would prefer not to try another statin) or ezetimibe.  -Blood pressure control: elevated today, but reports fluctuations, currently feeling poorly. Will hold on adjustments today  -Weight: BMI 21 with unintentional weight loss. See above. -ASCVD risk score: The 10-year ASCVD risk score Kyle Bussing DC Brooke Bonito., Kyle al., Kyle Rivers) is: 18%   Values used to calculate the score:     Age: 59 years     Sex: Male     Is Non-Hispanic African American: Yes     Diabetic: No     Tobacco smoker: No     Systolic Blood Pressure: 235 mmHg     Is BP treated: Yes     HDL Cholesterol: 58 mg/dL     Total Cholesterol: 236 mg/dL   Plan for follow up: 4 weeks or sooner PRN. Counseled on red flag symptoms that warrant immediate medical attention.  Medication Adjustments/Labs and Tests Ordered: Current medicines are reviewed at length with the patient today.  Concerns regarding medicines are outlined above.  Orders Placed This Encounter  Procedures  . NM Myocar Multi W/Spect W/Wall Motion / EF  . LONG TERM MONITOR (3-14 DAYS)  . EKG 12-Lead  . ECHOCARDIOGRAM COMPLETE   No orders of the defined types were placed in this encounter.   Patient Instructions  Medication Instructions:  Your Physician recommend you continue on your current medication as directed.    If you need a refill on your cardiac medications before your next appointment, please call your pharmacy.   Lab work: None   Testing/Procedures: Your physician has requested that you have an echocardiogram. Echocardiography is a painless test that uses sound waves to create images of your heart. It provides your doctor  with information about the size and shape of your heart and how well your heart's chambers and valves are working. This procedure takes approximately one hour. There are no restrictions for this procedure. [facility]  Our  physician has recommended that you wear an 14  DAY ZIO-PATCH monitor. The Zio patch cardiac monitor continuously records heart rhythm data for up to 14 days, this is for patients being evaluated for multiple types heart rhythms. For the first 24 hours post application, please avoid getting the Zio monitor wet in the shower or by excessive sweating during exercise. After that, feel free to carry on with regular activities. Keep soaps and lotions away from the ZIO XT Patch.   This will be placed at our [facility] location - [address].     Your physician has requested that you have en exercise stress myoview. For further information please visit [email or web address]. Please follow instruction sheet, as given. [place]: At [facility], you and your health needs are our priority.  As part of our continuing mission to provide you with exceptional heart care, we have created designated Provider Care Teams.  These Care Teams include your primary Cardiologist (physician) and Advanced Practice Providers (APPs -  Physician Assistants and Nurse Practitioners) who all work together to provide you with the care you need, when you need it. You will need a follow up appointment in 4 weeks.  Please call our office 2 months in advance to schedule this appointment.  You may see Dr. Harrell Gave or one of the following Advanced Practice Providers on your designated Care Team:   Rosaria Ferries, PA-C . Jory Sims, DNP, ANP  Any Other Special Instructions Will Be Listed Below (If Applicable).       Signed, Kyle Dresser, MD PhD 03/14/2018 5:16 PM    [place]

## 2018-03-18 ENCOUNTER — Encounter (HOSPITAL_COMMUNITY): Payer: Commercial Managed Care - PPO

## 2018-03-21 ENCOUNTER — Encounter (HOSPITAL_COMMUNITY): Payer: Self-pay

## 2018-03-21 ENCOUNTER — Ambulatory Visit: Payer: Commercial Managed Care - PPO

## 2018-03-21 ENCOUNTER — Encounter (HOSPITAL_BASED_OUTPATIENT_CLINIC_OR_DEPARTMENT_OTHER)
Admission: RE | Admit: 2018-03-21 | Discharge: 2018-03-21 | Disposition: A | Discharge status: Home / Self Care | Payer: Commercial Managed Care - PPO | Source: Ambulatory Visit | Attending: Cardiology | Admitting: Cardiology

## 2018-03-21 ENCOUNTER — Encounter (HOSPITAL_COMMUNITY)
Admission: RE | Admit: 2018-03-21 | Discharge: 2018-03-21 | Disposition: A | Discharge status: Home / Self Care | Payer: Commercial Managed Care - PPO | Source: Ambulatory Visit | Attending: Cardiology | Admitting: Cardiology

## 2018-03-21 ENCOUNTER — Ambulatory Visit (HOSPITAL_COMMUNITY)
Admission: RE | Admit: 2018-03-21 | Discharge: 2018-03-21 | Disposition: A | Discharge status: Home / Self Care | Payer: Commercial Managed Care - PPO | Source: Ambulatory Visit | Attending: Cardiology | Admitting: Cardiology

## 2018-03-21 DIAGNOSIS — R0602 Shortness of breath: Secondary | ICD-10-CM | POA: Diagnosis present

## 2018-03-21 DIAGNOSIS — R079 Chest pain, unspecified: Secondary | ICD-10-CM

## 2018-03-21 DIAGNOSIS — R002 Palpitations: Secondary | ICD-10-CM

## 2018-03-21 LAB — NM MYOCAR MULTI W/SPECT W/WALL MOTION / EF
CHL CUP NUCLEAR SDS: 0
CHL CUP NUCLEAR SSS: 1
CHL CUP RESTING HR STRESS: 98 {beats}/min
CSEPED: 6 min
CSEPHR: 108 %
Estimated workload: 8.2 METS
Exercise duration (sec): 24 s
LHR: 0.19
LV dias vol: 69 mL (ref 62–150)
LV sys vol: 33 mL
MPHR: 159 {beats}/min
Peak HR: 173 {beats}/min
SRS: 1
TID: 1.07

## 2018-03-21 MED ORDER — REGADENOSON 0.4 MG/5ML IV SOLN
INTRAVENOUS | Status: AC
  Filled 2018-03-21: qty 5

## 2018-03-21 MED ORDER — SODIUM CHLORIDE 0.9% FLUSH
INTRAVENOUS | Status: AC
  Administered 2018-03-21: 10:00:00 via INTRAVENOUS
  Filled 2018-03-21: qty 10

## 2018-03-21 MED ORDER — TECHNETIUM TC 99M TETROFOSMIN IV KIT
10.0000 | PACK | Freq: Once | INTRAVENOUS | Status: AC | PRN
  Administered 2018-03-21: 10.13 via INTRAVENOUS

## 2018-03-21 MED ORDER — TECHNETIUM TC 99M TETROFOSMIN IV KIT
30.0000 | PACK | Freq: Once | INTRAVENOUS | Status: AC | PRN
  Administered 2018-03-21: 30.3 via INTRAVENOUS

## 2018-03-21 NOTE — Progress Notes (Signed)
*  PRELIMINARY RESULTS* Echocardiogram 2D Echocardiogram has been performed.  Samuel Germany 03/21/2018, 9:09 AM

## 2018-03-21 NOTE — Progress Notes (Unsigned)
ZIO monitor ([id number]) placed

## 2018-04-13 ENCOUNTER — Ambulatory Visit: Payer: Commercial Managed Care - PPO | Admitting: Cardiology

## 2018-04-14 ENCOUNTER — Ambulatory Visit: Payer: Commercial Managed Care - PPO | Admitting: Cardiology

## 2018-04-14 NOTE — Progress Notes (Deleted)
Clinical Summary Kyle Rivers is a 62 y.o.male   Past Medical History:  Diagnosis Date  . BPH (benign prostatic hypertrophy)   . 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 Ear infection 08/12/2016   pt believes he may be coming down with an ear infection today  . Hemorrhoids   . Hyperlipidemia   . Hypertension   . LVH (left ventricular hypertrophy)    LVH variant, EKG show ST elevation, normal cardiac catherization 2009  . Prostatitis      Allergies  Allergen Reactions  . Oxycodone     " makes me sick with nausea "  . Lipitor [Atorvastatin] Rash and Other (See Comments)    "Bumps showed up on skin "     Current Outpatient Medications  Medication Sig Dispense Refill  . amLODipine (NORVASC) 10 MG tablet TAKE 1 TABLET BY MOUTH  DAILY 90 tablet 0  . Co-Enzyme Q-10 100 MG CAPS Take 1 capsule by mouth daily.    . fenofibrate (TRICOR) 48 MG tablet TAKE 1 TABLET BY MOUTH  DAILY 90 tablet 0  . fish oil-omega-3 fatty acids 1000 MG capsule Take 2 g by mouth daily.     . Menthol-Methyl Salicylate (MUSCLE RUB) 10-15 % CREA Apply 1 application topically as needed for muscle pain.    . naphazoline-glycerin (CLEAR EYES REDNESS) 0.012-0.2 % SOLN Place 2 drops into both eyes 4 (four) times daily as needed for eye irritation.    Marland Kitchen OVER THE COUNTER MEDICATION See admin instructions. Genius brand Digestion Opitimizer. Take 3-4 capsules daily.    . pantoprazole (PROTONIX) 40 MG tablet TAKE 1 TABLET BY MOUTH  DAILY 90 tablet 0  . Probiotic Product (ACIDOPHILUS/BIFIDUS PO) Take 1 capsule by mouth daily.    . tamsulosin (FLOMAX) 0.4 MG CAPS capsule TAKE 1 CAPSULE BY MOUTH  DAILY 90 capsule 0  . TURMERIC PO Take 1,000 mg by mouth daily.      No current facility-administered medications for this visit.      Past Surgical History:  Procedure Laterality Date  . ANTERIOR CERVICAL DECOMP/DISCECTOMY FUSION N/A 08/12/2016   Procedure: C5-6, C6-7 ANTERIOR CERVICAL  DECOMPRESSION/DISCECTOMY FUSION 2 LEVELS, ALLOGRAFT, PLATE;  Surgeon: Marybelle Killings, MD;  Location: [facility];  Service: Orthopedics;  Laterality: N/A;  . CARDIAC CATHETERIZATION    . CERVICAL DISC SURGERY  08/13/2015   c 5 c6 c7  . COLONOSCOPY    . COLONOSCOPY N/A 10/02/2016   Procedure: COLONOSCOPY;  Surgeon: Rogene Houston, MD;  [facility];  Service: Endoscopy;  Laterality: N/A;  12:45  . HEMORRHOID SURGERY    . POLYPECTOMY  10/02/2016   Procedure: POLYPECTOMY;  Surgeon: Rogene Houston, MD;  [facility];  Service: Endoscopy;;  colon  . UPPER GASTROINTESTINAL ENDOSCOPY       Allergies  Allergen Reactions  . Oxycodone     " makes me sick with nausea "  . Lipitor [Atorvastatin] Rash and Other (See Comments)    "Bumps showed up on skin "      Family History  Problem Relation Age of Onset  . Stroke Mother   . Hypertension Father   . Diabetes Father   . Prostate cancer Father   . Diabetes Sister   . Diabetes Sister      Social History Kyle Rivers reports that he has quit smoking. His smoking use included cigarettes. He has a 15.00 pack-year smoking history. He has  quit using smokeless tobacco. Kyle Rivers reports that he drinks alcohol.   Review of Systems CONSTITUTIONAL: No weight loss, fever, chills, weakness or fatigue.  HEENT: Eyes: No visual loss, blurred vision, double vision or yellow sclerae.No hearing loss, sneezing, congestion, runny nose or sore throat.  SKIN: No rash or itching.  CARDIOVASCULAR:  RESPIRATORY: No shortness of breath, cough or sputum.  GASTROINTESTINAL: No anorexia, nausea, vomiting or diarrhea. No abdominal pain or blood.  GENITOURINARY: No burning on urination, no polyuria NEUROLOGICAL: No headache, dizziness, syncope, paralysis, ataxia, numbness or tingling in the extremities. No change in bowel or bladder control.  MUSCULOSKELETAL: No muscle, back pain, joint pain or stiffness.  LYMPHATICS: No enlarged nodes. No history  of splenectomy.  PSYCHIATRIC: No history of depression or anxiety.  ENDOCRINOLOGIC: No reports of sweating, cold or heat intolerance. No polyuria or polydipsia.  Marland Kitchen   Physical Examination There were no vitals filed for this visit. There were no vitals filed for this visit.  Gen: resting comfortably, no acute distress HEENT: no scleral icterus, pupils equal round and reactive, no palptable cervical adenopathy,  CV Resp: Clear to auscultation bilaterally GI: abdomen is soft, non-tender, non-distended, normal bowel sounds, no hepatosplenomegaly MSK: extremities are warm, no edema.  Skin: warm, no rash Neuro:  no focal deficits Psych: appropriate affect   Diagnostic Studies     Assessment and Plan        Arnoldo Lenis, M.D., F.A.C.C.

## 2018-05-05 ENCOUNTER — Other Ambulatory Visit: Payer: Self-pay | Admitting: Family Medicine

## 2018-05-17 ENCOUNTER — Ambulatory Visit: Payer: Commercial Managed Care - PPO | Admitting: Family Medicine

## 2018-05-24 ENCOUNTER — Ambulatory Visit: Payer: Commercial Managed Care - PPO | Admitting: Family Medicine

## 2018-05-25 ENCOUNTER — Encounter: Payer: Self-pay | Admitting: Family Medicine

## 2018-06-16 ENCOUNTER — Ambulatory Visit (INDEPENDENT_AMBULATORY_CARE_PROVIDER_SITE_OTHER): Payer: Commercial Managed Care - PPO | Admitting: Cardiology

## 2018-06-16 ENCOUNTER — Encounter: Payer: Self-pay | Admitting: Cardiology

## 2018-06-16 ENCOUNTER — Other Ambulatory Visit: Payer: Self-pay | Admitting: *Deleted

## 2018-06-16 ENCOUNTER — Ambulatory Visit: Payer: Commercial Managed Care - PPO

## 2018-06-16 VITALS — BP 149/87 | HR 76 | Ht 67.0 in | Wt 140.6 lb

## 2018-06-16 DIAGNOSIS — I4729 Other ventricular tachycardia: Secondary | ICD-10-CM

## 2018-06-16 DIAGNOSIS — R002 Palpitations: Secondary | ICD-10-CM | POA: Diagnosis not present

## 2018-06-16 DIAGNOSIS — I472 Ventricular tachycardia: Secondary | ICD-10-CM | POA: Diagnosis not present

## 2018-06-16 DIAGNOSIS — R0602 Shortness of breath: Secondary | ICD-10-CM | POA: Diagnosis not present

## 2018-06-16 DIAGNOSIS — I471 Supraventricular tachycardia: Secondary | ICD-10-CM

## 2018-06-16 NOTE — Progress Notes (Signed)
Clinical Summary Kyle Rivers is a 63 y.o.male last seen by Dr Harrell Gave, this is our first visit together  1. Fatigue/SOB/Palpitations - - symptoms have resolved - has increased his oral hydration and symptoms have improved. Dizziness has resolved - palpitations have resolved.  -03/2018 echo LVEF 65-70%, no WMAs, grade I diastolic dysfunction 40/9811 nuclear stress: no ischemia   03/2018 monitor: Min HR 85, Max HR 193, Avg HR 85 Available strips show SVT up to 10 beats, NSVT up to 7 beats.     2. HTN - has not taken meds yet - home bps 130s/80      Past Medical History:  Diagnosis Date  . BPH (benign prostatic hypertrophy)   . 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 Ear infection 08/12/2016   pt believes he may be coming down with an ear infection today  . Hemorrhoids   . Hyperlipidemia   . Hypertension   . LVH (left ventricular hypertrophy)    LVH variant, EKG show ST elevation, normal cardiac catherization 2009  . Prostatitis      Allergies  Allergen Reactions  . Oxycodone     " makes me sick with nausea "  . Lipitor [Atorvastatin] Rash and Other (See Comments)    "Bumps showed up on skin "     Current Outpatient Medications  Medication Sig Dispense Refill  . amLODipine (NORVASC) 10 MG tablet TAKE 1 TABLET BY MOUTH  DAILY 90 tablet 0  . Co-Enzyme Q-10 100 MG CAPS Take 1 capsule by mouth daily.    . fenofibrate (TRICOR) 48 MG tablet TAKE 1 TABLET BY MOUTH  DAILY 90 tablet 0  . fish oil-omega-3 fatty acids 1000 MG capsule Take 2 g by mouth daily.     . Menthol-Methyl Salicylate (MUSCLE RUB) 10-15 % CREA Apply 1 application topically as needed for muscle pain.    . naphazoline-glycerin (CLEAR EYES REDNESS) 0.012-0.2 % SOLN Place 2 drops into both eyes 4 (four) times daily as needed for eye irritation.    Marland Kitchen OVER THE COUNTER MEDICATION See admin instructions. Genius brand Digestion Opitimizer. Take 3-4 capsules daily.    . pantoprazole  (PROTONIX) 40 MG tablet TAKE 1 TABLET BY MOUTH  DAILY 90 tablet 0  . Probiotic Product (ACIDOPHILUS/BIFIDUS PO) Take 1 capsule by mouth daily.    . tamsulosin (FLOMAX) 0.4 MG CAPS capsule TAKE 1 CAPSULE BY MOUTH  DAILY 90 capsule 0  . TURMERIC PO Take 1,000 mg by mouth daily.      No current facility-administered medications for this visit.      Past Surgical History:  Procedure Laterality Date  . ANTERIOR CERVICAL DECOMP/DISCECTOMY FUSION N/A 08/12/2016   Procedure: C5-6, C6-7 ANTERIOR CERVICAL DECOMPRESSION/DISCECTOMY FUSION 2 LEVELS, ALLOGRAFT, PLATE;  Surgeon: Marybelle Killings, MD;  Location: [place];  Service: Orthopedics;  Laterality: N/A;  . CARDIAC CATHETERIZATION    . CERVICAL DISC SURGERY  08/13/2015   c 5 c6 c7  . COLONOSCOPY    . COLONOSCOPY N/A 10/02/2016   Procedure: COLONOSCOPY;  Surgeon: Rogene Houston, MD;  [facility];  Service: Endoscopy;  Laterality: N/A;  12:45  . HEMORRHOID SURGERY    . POLYPECTOMY  10/02/2016   Procedure: POLYPECTOMY;  Surgeon: Rogene Houston, MD;  Location: [facility];  Service: Endoscopy;;  colon  . UPPER GASTROINTESTINAL ENDOSCOPY       Allergies  Allergen Reactions  . Oxycodone     "  makes me sick with nausea "  . Lipitor [Atorvastatin] Rash and Other (See Comments)    "Bumps showed up on skin "      Family History  Problem Relation Age of Onset  . Stroke Mother   . Hypertension Father   . Diabetes Father   . Prostate cancer Father   . Diabetes Sister   . Diabetes Sister      Social History Kyle Rivers reports that he has quit smoking. His smoking use included cigarettes. He has a 15.00 pack-year smoking history. He has quit using smokeless tobacco. Kyle Rivers reports current alcohol use.   Review of Systems CONSTITUTIONAL: No weight loss, fever, chills, weakness or fatigue.  HEENT: Eyes: No visual loss, blurred vision, double vision or yellow sclerae.No hearing loss, sneezing, congestion, runny nose or sore  throat.  SKIN: No rash or itching.  CARDIOVASCULAR: per hpi RESPIRATORY: No shortness of breath, cough or sputum.  GASTROINTESTINAL: No anorexia, nausea, vomiting or diarrhea. No abdominal pain or blood.  GENITOURINARY: No burning on urination, no polyuria NEUROLOGICAL: No headache, dizziness, syncope, paralysis, ataxia, numbness or tingling in the extremities. No change in bowel or bladder control.  MUSCULOSKELETAL: No muscle, back pain, joint pain or stiffness.  LYMPHATICS: No enlarged nodes. No history of splenectomy.  PSYCHIATRIC: No history of depression or anxiety.  ENDOCRINOLOGIC: No reports of sweating, cold or heat intolerance. No polyuria or polydipsia.  Marland Kitchen   Physical Examination Vitals:   06/16/18 1045  BP: (!) 149/87  Pulse: 76  SpO2: 99%   Vitals:   06/16/18 1045  Weight: 140 lb 9.6 oz (63.8 kg)  Height: 5\' 7"  (1.702 m)    Gen: resting comfortably, no acute distress HEENT: no scleral icterus, pupils equal round and reactive, no palptable cervical adenopathy,  CV: RRR, no m/r/g, no jvd Resp: Clear to auscultation bilaterally GI: abdomen is soft, non-tender, non-distended, normal bowel sounds, no hepatosplenomegaly MSK: extremities are warm, no edema.  Skin: warm, no rash Neuro:  no focal deficits Psych: appropriate affect   Diagnostic Studies  03/2018 echo Study Conclusions  - Left ventricle: The cavity size was normal. Wall thickness was   increased in a pattern of moderate LVH. Systolic function was   vigorous. The estimated ejection fraction was in the range of 65%   to 70%. Wall motion was normal; there were no regional wall   motion abnormalities. Doppler parameters are consistent with   abnormal left ventricular relaxation (grade 1 diastolic   dysfunction). Doppler parameters are consistent with   indeterminate ventricular filling pressure. - Mitral valve: There was mild regurgitation. - Tricuspid valve: There was mild regurgitation.  03/2018  nuclear stress  Blood pressure demonstrated a hypertensive response to exercise.  LVH with diffuse repolarization abnormalities seen throughout study.  The study is normal. No evidence of myocardial ischemia or scar.  This is a low risk study.  Nuclear stress EF: 53%.   Assessment and Plan  1. Palpitations/SOB - monitor shows some NSVT and PSVT - echo and nuclear stress do not show any significant underlying heart disease - symptoms have resolved. COntinue to monitor, if reoccur would start beta blocker  2. HTN - elevated today but has not take meds yet, continue to monitor    F/u 6 months    Arnoldo Lenis, M.D.

## 2018-06-16 NOTE — Addendum Note (Signed)
Addended by: Merlene Laughter on: 06/16/2018 11:53 AM   Modules accepted: Level of Service, SmartSet

## 2018-06-16 NOTE — Progress Notes (Signed)
ORDER CANCELED

## 2018-06-16 NOTE — Patient Instructions (Addendum)

## 2018-06-16 NOTE — Progress Notes (Signed)
This encounter was created in error - please disregard.

## 2018-07-30 ENCOUNTER — Other Ambulatory Visit: Payer: Self-pay | Admitting: Family Medicine

## 2018-08-10 ENCOUNTER — Ambulatory Visit (INDEPENDENT_AMBULATORY_CARE_PROVIDER_SITE_OTHER): Payer: Commercial Managed Care - PPO | Admitting: Family Medicine

## 2018-08-10 ENCOUNTER — Encounter: Payer: Self-pay | Admitting: Family Medicine

## 2018-08-10 ENCOUNTER — Other Ambulatory Visit: Payer: Self-pay

## 2018-08-10 VITALS — BP 130/82 | HR 87 | Temp 98.6°F | Resp 14 | Wt 142.0 lb

## 2018-08-10 DIAGNOSIS — H6123 Impacted cerumen, bilateral: Secondary | ICD-10-CM

## 2018-08-10 DIAGNOSIS — H60391 Other infective otitis externa, right ear: Secondary | ICD-10-CM | POA: Diagnosis not present

## 2018-08-10 MED ORDER — FENOFIBRATE 48 MG PO TABS: 48.0000 mg | ORAL_TABLET | Freq: Every day | ORAL | 1 refills | Status: DC

## 2018-08-10 MED ORDER — PANTOPRAZOLE SODIUM 40 MG PO TBEC: 40.0000 mg | DELAYED_RELEASE_TABLET | Freq: Every day | ORAL | 1 refills | Status: DC

## 2018-08-10 MED ORDER — NEOMYCIN-POLYMYXIN-HC 3.5-10000-1 OT SOLN: 3.0000 [drp] | Freq: Four times a day (QID) | OTIC | 0 refills | Status: DC

## 2018-08-10 MED ORDER — TAMSULOSIN HCL 0.4 MG PO CAPS: 0.4000 mg | ORAL_CAPSULE | Freq: Every day | ORAL | 1 refills | Status: DC

## 2018-08-10 MED ORDER — AMLODIPINE BESYLATE 10 MG PO TABS: 10.0000 mg | ORAL_TABLET | Freq: Every day | ORAL | 1 refills | Status: DC

## 2018-08-10 NOTE — Patient Instructions (Addendum)
Use ear drop Avoid q tips and olive oil F/U schedule physical for July

## 2018-08-10 NOTE — Progress Notes (Signed)
   Subjective:    Patient ID: Kyle Rivers, male    DOB: 16-May-1955, 63 y.o.   MRN: [id number]  Patient presents for Check ear  Pt here with right ear pain for past week, started using olive oil which helped some, tenderness with pressing on front of ear. Hearing a little muffled, no fever, no sinus congestion, no cough.    Review Of Systems:  GEN- denies fatigue, fever, weight loss,weakness, recent illness HEENT- denies eye drainage, change in vision, nasal discharge, CVS- denies chest pain, palpitations RESP- denies SOB, cough, wheeze Neuro- denies headache, dizziness, syncope, seizure activity       Objective:    BP 130/82   Pulse 87   Temp 98.6 F (37 C) (Oral)   Resp 14   Wt 142 lb (64.4 kg)   SpO2 98%   BMI 22.24 kg/m  GEN- NAD, alert and oriented x3 HEENT- PERRL, EOMI, non injected sclera, pink conjunctiva, MMM, oropharynxclear, bilat cerumen impaction  Left TM clear canal clear, Right TM partially visualized in tact no erythema, white discharge and erythema, swelling of right canal s/p irrigation  Nares clear, no sinus tenderness Neck- Supple, no LAD CVS- RRR, no murmur RESP-CTAB          Assessment & Plan:      Problem List Items Addressed This Visit    None    Visit Diagnoses    Other infective acute otitis externa of right ear    -  Primary   Treat with cortisporin otic, no further olive oil to ear, no q tips, TM not completely visualized , no blood in canal   Bilateral impacted cerumen       improved s/p irrigation      Note: This dictation was prepared with Dragon dictation along with smaller phrase technology. Any transcriptional errors that result from this process are unintentional.

## 2018-10-23 ENCOUNTER — Other Ambulatory Visit: Payer: Self-pay | Admitting: Family Medicine

## 2019-01-20 ENCOUNTER — Encounter: Payer: Self-pay | Admitting: Family Medicine

## 2019-01-20 ENCOUNTER — Other Ambulatory Visit: Payer: Self-pay

## 2019-01-20 ENCOUNTER — Ambulatory Visit (INDEPENDENT_AMBULATORY_CARE_PROVIDER_SITE_OTHER): Payer: Commercial Managed Care - PPO | Admitting: Family Medicine

## 2019-01-20 VITALS — BP 128/80 | HR 80 | Temp 99.0°F | Resp 12 | Ht 67.0 in | Wt 135.0 lb

## 2019-01-20 DIAGNOSIS — H9201 Otalgia, right ear: Secondary | ICD-10-CM | POA: Diagnosis not present

## 2019-01-20 DIAGNOSIS — H6123 Impacted cerumen, bilateral: Secondary | ICD-10-CM

## 2019-01-20 DIAGNOSIS — H9191 Unspecified hearing loss, right ear: Secondary | ICD-10-CM | POA: Diagnosis not present

## 2019-01-20 MED ORDER — NEOMYCIN-POLYMYXIN-HC 3.5-10000-1 OT SOLN: 3.0000 [drp] | Freq: Four times a day (QID) | OTIC | 0 refills | Status: DC

## 2019-01-20 NOTE — Patient Instructions (Addendum)
Schedule Physical  [facility] E[facility]-  Next Thursday 9/17 at [address]

## 2019-01-20 NOTE — Progress Notes (Signed)
   Subjective:    Patient ID: Kyle Rivers, male    DOB: 1955/08/28, 63 y.o.   MRN: [id number]  Patient presents for R Ear Pain (decreased hearing, feels full, has been trying to wash out by self)   Right ear with pain and fullness. Feels like hearing is decreased.   Using peroxide, olive Oil past 2 weeks but not improved  He had same ear pain back in April, improved some with otic drops and irrigation but still felt full  no sinus pressure congestion, no fever, no cough  Taking all other meds as prescribed      Review Of Systems:  GEN- denies fatigue, fever, weight loss,weakness, recent illness HEENT- denies eye drainage, change in vision, nasal discharge, CVS- denies chest pain, palpitations RESP- denies SOB, cough, wheeze Neuro- denies headache, dizziness, syncope, seizure activity       Objective:    BP 128/80   Pulse 80   Temp 99 F (37.2 C) (Oral)   Resp 12   Ht 5\' 7"  (1.702 m)   Wt 135 lb (61.2 kg)   SpO2 98%   BMI 21.14 kg/m  GEN- NAD, alert and oriented x3 HEENT- PERRL, EOMI, non injected sclera, pink conjunctiva, MMM, , unable to visualize TM bilat has long ear hairs, cerumen noted, decreased hearing right side  Neck- Supple, no LAD CVS- RRR, no murmur RESP-CTAB Pulses- Radial 2+        Assessment & Plan:      Problem List Items Addressed This Visit    None    Visit Diagnoses    Right ear pain    -  Primary   Concerned for recurrent pain, unable to do proper exam with the ear hairs and cerumen, Will try to get urgent ENT exam   We tried to flush at bedside, water ran clear, has mild swelling in canal but still cant visualize his TM  Since ENT appt in 1 week out, will see if cortisporin ear drop will help   nothing else into the ear  He did fail hearing test on right side   Relevant Orders   Ambulatory referral to ENT   Decreased hearing of right ear       Relevant Orders   Ambulatory referral to ENT   Bilateral impacted cerumen       Relevant Orders   Ambulatory referral to ENT      Note: This dictation was prepared with Dragon dictation along with smaller phrase technology. Any transcriptional errors that result from this process are unintentional.

## 2019-04-03 ENCOUNTER — Other Ambulatory Visit: Payer: Self-pay

## 2019-04-04 ENCOUNTER — Ambulatory Visit (INDEPENDENT_AMBULATORY_CARE_PROVIDER_SITE_OTHER): Payer: Commercial Managed Care - PPO | Admitting: Family Medicine

## 2019-04-04 ENCOUNTER — Encounter: Payer: Self-pay | Admitting: Family Medicine

## 2019-04-04 VITALS — BP 142/70 | HR 75 | Temp 98.5°F | Resp 18 | Ht 67.0 in | Wt 131.2 lb

## 2019-04-04 DIAGNOSIS — I10 Essential (primary) hypertension: Secondary | ICD-10-CM | POA: Diagnosis not present

## 2019-04-04 DIAGNOSIS — H9201 Otalgia, right ear: Secondary | ICD-10-CM | POA: Diagnosis not present

## 2019-04-04 DIAGNOSIS — R350 Frequency of micturition: Secondary | ICD-10-CM | POA: Diagnosis not present

## 2019-04-04 DIAGNOSIS — R809 Proteinuria, unspecified: Secondary | ICD-10-CM

## 2019-04-04 LAB — URINALYSIS, ROUTINE W REFLEX MICROSCOPIC
Bacteria, UA: NONE SEEN /HPF
Bilirubin Urine: NEGATIVE
Glucose, UA: NEGATIVE
Hgb urine dipstick: NEGATIVE
Hyaline Cast: NONE SEEN /LPF
Ketones, ur: NEGATIVE
Leukocytes,Ua: NEGATIVE
Nitrite: NEGATIVE
RBC / HPF: NONE SEEN /HPF (ref 0–2)
Specific Gravity, Urine: 1.02 (ref 1.001–1.03)
WBC, UA: NONE SEEN /HPF (ref 0–5)
pH: 6.5 (ref 5.0–8.0)

## 2019-04-04 LAB — MICROSCOPIC MESSAGE

## 2019-04-04 MED ORDER — CIPROFLOXACIN HCL 500 MG PO TABS: 500.0000 mg | ORAL_TABLET | Freq: Two times a day (BID) | ORAL | 0 refills | Status: DC

## 2019-04-04 NOTE — Progress Notes (Signed)
Subjective:    Patient ID: Kyle Rivers, male    DOB: 08/25/1955, 63 y.o.   MRN: [id number]  Patient presents for Hypertension and Dizziness (started 11/20) Patient here to follow-up his blood pressure. He is currently on amlodipine 10 mg once a day.  He was on metoprolol in the past but had hypotensive episodes and some erectile dysfunction with the medication. Past few days he states he does not feel well.  His blood pressure was a little elevated 1 [id number] 50 systolic.  He had some mild myalgias but only lasted a day he is also had dizzy spells she generally felt unwell.  He then noticed urinary frequency where he was urinating every 1-2 hours Friday and Saturday.  He also had some right ear discomfort which he has had in the past.  He started taking amoxicillin the past 2 days and feels like this has helped with his urinary symptoms and his ear.  He denies any gross hematuria no change in his bowels no swelling of the joints.  No known Covid contacts.     Review Of Systems:  GEN- denies fatigue, fever, weight loss,weakness, recent illness HEENT- denies eye drainage, change in vision, nasal discharge, CVS- denies chest pain, palpitations RESP- denies SOB, cough, wheeze ABD- denies N/V, change in stools, abd pain GU- denies dysuria, hematuria, dribbling, incontinence MSK- denies joint pain, +muscle aches, injury Neuro- denies headache, +dizziness, syncope, seizure activity       Objective:    BP (!) 142/70 (BP Location: Right Arm, Patient Position: Sitting, Cuff Size: Normal)   Pulse 75   Temp 98.5 F (36.9 C) (Oral)   Resp 18   Ht 5\' 7"  (1.702 m)   Wt 131 lb 3.2 oz (59.5 kg)   SpO2 98%   BMI 20.55 kg/m  GEN- NAD, alert and oriented x3, nontoxic-appearing HEENT- PERRL, EOMI, non injected sclera, pink conjunctiva, MMM, oropharynx clear, right canal with discharge in the ear(similar to his previous) no erythema of the canal left canal clear TM intact Neck- Supple, no  thyromegaly CVS- RRR, no murmur RESP-CTAB ABD-NABS,soft,NT,ND EXT- No edema Pulses- Radial, 2+        Assessment & Plan:      Problem List Items Addressed This Visit      Unprioritized   Essential hypertension - Primary    Blood pressure is mildly elevated today.  I am concerned that he either has underlying infection such as urinary tract infection or prostatitis or something renally going on as he does have 1+ protein in the urine.   We will go ahead and start him on ciprofloxacin 500 mg twice a day as he has had prostatitis in the past.  We will send his urine for culture.  We will check his renal function and CBC.  As he did have a myalgias with the dizziness multitude of different symptoms he would go ahead and proceed with Covid testing to be on the safe side.  Regarding his right otalgia he has had this discharge on and off in the ear his hearing is often muffled.  He has had a suction out before and has been doing a cleaning regimen per ENT.  Advised that he can go back to ENT to have the discharge suctioned out again.      Relevant Orders   CBC with Differential   Comprehensive metabolic panel    Other Visit Diagnoses    Urinary frequency       Relevant Orders  Urinalysis, Routine w reflex microscopic (Completed)   Microalbumin/Creatinine Ratio, Urine   Urine Culture   Otalgia, right       Proteinuria, unspecified type          Note: This dictation was prepared with Dragon dictation along with smaller phrase technology. Any transcriptional errors that result from this process are unintentional.

## 2019-04-04 NOTE — Patient Instructions (Signed)
Covid testing [facility] 10-3pm  Take antibiotics Push fluids I will call with lab results  F/U pending results

## 2019-04-04 NOTE — Assessment & Plan Note (Signed)
Blood pressure is mildly elevated today.  I am concerned that he either has underlying infection such as urinary tract infection or prostatitis or something renally going on as he does have 1+ protein in the urine.   We will go ahead and start him on ciprofloxacin 500 mg twice a day as he has had prostatitis in the past.  We will send his urine for culture.  We will check his renal function and CBC.  As he did have a myalgias with the dizziness multitude of different symptoms he would go ahead and proceed with Covid testing to be on the safe side.  Regarding his right otalgia he has had this discharge on and off in the ear his hearing is often muffled.  He has had a suction out before and has been doing a cleaning regimen per ENT.  Advised that he can go back to ENT to have the discharge suctioned out again.

## 2019-04-05 ENCOUNTER — Other Ambulatory Visit: Payer: Self-pay

## 2019-04-05 DIAGNOSIS — Z20822 Contact with and (suspected) exposure to covid-19: Secondary | ICD-10-CM

## 2019-04-05 LAB — CBC WITH DIFFERENTIAL/PLATELET
Absolute Monocytes: 538 cells/uL (ref 200–950)
Basophils Absolute: 50 cells/uL (ref 0–200)
Basophils Relative: 0.6 %
Eosinophils Absolute: 76 cells/uL (ref 15–500)
Eosinophils Relative: 0.9 %
HCT: 43 % (ref 38.5–50.0)
Hemoglobin: 15.1 g/dL (ref 13.2–17.1)
Lymphs Abs: 1814 cells/uL (ref 850–3900)
MCH: 32.5 pg (ref 27.0–33.0)
MCHC: 35.1 g/dL (ref 32.0–36.0)
MCV: 92.7 fL (ref 80.0–100.0)
MPV: 11.7 fL (ref 7.5–12.5)
Monocytes Relative: 6.4 %
Neutro Abs: 5922 cells/uL (ref 1500–7800)
Neutrophils Relative %: 70.5 %
Platelets: 194 10*3/uL (ref 140–400)
RBC: 4.64 10*6/uL (ref 4.20–5.80)
RDW: 12.7 % (ref 11.0–15.0)
Total Lymphocyte: 21.6 %
WBC: 8.4 10*3/uL (ref 3.8–10.8)

## 2019-04-05 LAB — COMPREHENSIVE METABOLIC PANEL
AG Ratio: 1.3 (calc) (ref 1.0–2.5)
ALT: 8 U/L — ABNORMAL LOW (ref 9–46)
AST: 14 U/L (ref 10–35)
Albumin: 4.4 g/dL (ref 3.6–5.1)
Alkaline phosphatase (APISO): 65 U/L (ref 35–144)
BUN: 10 mg/dL (ref 7–25)
CO2: 25 mmol/L (ref 20–32)
Calcium: 10.3 mg/dL (ref 8.6–10.3)
Chloride: 103 mmol/L (ref 98–110)
Creat: 1.24 mg/dL (ref 0.70–1.25)
Globulin: 3.4 g/dL (calc) (ref 1.9–3.7)
Glucose, Bld: 119 mg/dL — ABNORMAL HIGH (ref 65–99)
Potassium: 3.7 mmol/L (ref 3.5–5.3)
Sodium: 139 mmol/L (ref 135–146)
Total Bilirubin: 0.6 mg/dL (ref 0.2–1.2)
Total Protein: 7.8 g/dL (ref 6.1–8.1)

## 2019-04-05 LAB — MICROALBUMIN / CREATININE URINE RATIO
Creatinine, Urine: 133 mg/dL (ref 20–320)
Microalb Creat Ratio: 77 mcg/mg creat — ABNORMAL HIGH (ref <30)
Microalb, Ur: 10.2 mg/dL

## 2019-04-06 LAB — URINE CULTURE
MICRO NUMBER:: 1134685
Result:: NO GROWTH
SPECIMEN QUALITY:: ADEQUATE

## 2019-04-06 LAB — NOVEL CORONAVIRUS, NAA: SARS-CoV-2, NAA: NOT DETECTED

## 2019-04-08 ENCOUNTER — Other Ambulatory Visit: Payer: Self-pay | Admitting: Family Medicine

## 2019-04-17 ENCOUNTER — Ambulatory Visit: Payer: Commercial Managed Care - PPO | Admitting: Family Medicine

## 2019-04-21 ENCOUNTER — Other Ambulatory Visit: Payer: Self-pay

## 2019-04-21 ENCOUNTER — Encounter: Payer: Self-pay | Admitting: Family Medicine

## 2019-04-21 ENCOUNTER — Ambulatory Visit (INDEPENDENT_AMBULATORY_CARE_PROVIDER_SITE_OTHER): Payer: Commercial Managed Care - PPO | Admitting: Family Medicine

## 2019-04-21 VITALS — BP 132/68 | HR 84 | Temp 98.7°F | Resp 14 | Ht 67.0 in | Wt 132.0 lb

## 2019-04-21 DIAGNOSIS — I10 Essential (primary) hypertension: Secondary | ICD-10-CM | POA: Diagnosis not present

## 2019-04-21 DIAGNOSIS — E860 Dehydration: Secondary | ICD-10-CM | POA: Diagnosis not present

## 2019-04-21 DIAGNOSIS — Z23 Encounter for immunization: Secondary | ICD-10-CM

## 2019-04-21 DIAGNOSIS — R809 Proteinuria, unspecified: Secondary | ICD-10-CM

## 2019-04-21 LAB — URINALYSIS, ROUTINE W REFLEX MICROSCOPIC
Bilirubin Urine: NEGATIVE
Glucose, UA: NEGATIVE
Hgb urine dipstick: NEGATIVE
Ketones, ur: NEGATIVE
Leukocytes,Ua: NEGATIVE
Nitrite: NEGATIVE
Protein, ur: NEGATIVE
Specific Gravity, Urine: 1.015 (ref 1.001–1.03)
pH: 5.5 (ref 5.0–8.0)

## 2019-04-21 NOTE — Progress Notes (Signed)
Subjective:    Patient ID: Kyle Rivers, male    DOB: 07/14/1955, 63 y.o.   MRN: [id number]  Patient presents for Follow-up (HTN/proteinuria)  Here for interim follow-up on his blood pressure is well as protein in his urine.  He was seen on the 24th he had Feelings of being unwell.  He had had some dizzy spells his blood pressure was a little elevated he had urinary frequency as well as right ear discomfort.  He did get Covid testing this came back negative.  His labs were fairly unremarkable with the exception of protein in his urine.  Urine culture was negative for any infection though he did have a history of prostatitis so I had him take ciprofloxacin and his urinary symptoms improved.  He is here today for recheck.  He has been monitoring his blood pressure at home and states that they have improved.  They were up to diastolic of 0000000.  He states that his symptoms feel very similar to last year when he was worked up in the hospital with chest pain dizzy spells GI upset.  He ended up getting IV fluids and taking a liquid IV supplement for dehydration and resolve his problem.  He did state that he was having some urinary symptoms still but after taking cranberry juice this also improved.  He denies any bowel symptoms.  Earlier in the week he having more left flank side pain but that has improved some.  With regards to his ear discomfort and discharge he was seen by ENT they suctioned the ear he had infective otitis externa again he is now back on eardrops his dizzy spells have improved   RECENT labs reviewed Medications reviewed ENT note reviewed    Review Of Systems:  GEN- denies fatigue, fever, weight loss,weakness, recent illness HEENT- denies eye drainage, change in vision, nasal discharge, CVS- denies chest pain, palpitations RESP- denies SOB, cough, wheeze ABD- denies N/V, change in stools, abd pain GU- denies dysuria, hematuria, dribbling, incontinence MSK- denies joint pain,  muscle aches, injury Neuro- denies headache, +dizziness, syncope, seizure activity       Objective:    BP 132/68   Pulse 84   Temp 98.7 F (37.1 C) (Temporal)   Resp 14   Ht 5\' 7"  (1.702 m)   Wt 132 lb (59.9 kg)   SpO2 98%   BMI 20.67 kg/m  GEN- NAD, alert and oriented x3 HEENT- PERRL, EOMI, non injected sclera, pink conjunctiva, MMM, oropharynx clear Neck- Supple, CVS- RRR, no murmur RESP-CTAB ABD-NABS,soft,NT,ND EXT- No edema Pulses- Radial, DP- 2+        Assessment & Plan:     OV 1 hour - including IVF administration, discussion of meds/ labs  Problem List Items Addressed This Visit      Unprioritized   Essential hypertension    Blood pressure improved today peer we will have him continue monitoring at home no change in medication.  The protein seen in his urinalysis before has now resolved.  I am sending another urine micro.  His symptoms are difficult to piece together however he he did have dehydration when he had this multitude of symptoms last year.  I gave him 1 L of normal saline in the office to see if that will help.  With regards to the dizziness treating that otitis externa helps that piece.  He is to complete antibiotics for the prostatitis as well.  I will repeat his metabolic panel.  I did consider  CT scan but he has a benign abdomen so we will hold off on this and see how he does over the weekend.  In General most of his symptoms have improved       Other Visit Diagnoses    Proteinuria, unspecified type    -  Primary   Relevant Orders   Urinalysis, Routine w reflex microscopic (Completed)   Microalbumin/Creatinine Ratio, Urine   Basic metabolic panel   Dehydration       Relevant Orders   Basic metabolic panel      Note: This dictation was prepared with Dragon dictation along with smaller phrase technology. Any transcriptional errors that result from this process are unintentional.

## 2019-04-21 NOTE — Patient Instructions (Signed)
Call if not improved and CT scan will be done

## 2019-04-21 NOTE — Assessment & Plan Note (Addendum)
Blood pressure improved today peer we will have him continue monitoring at home no change in medication.  The protein seen in his urinalysis before has now resolved.  I am sending another urine micro.  His symptoms are difficult to piece together however he he did have dehydration when he had this multitude of symptoms last year.  I gave him 1 L of normal saline in the office to see if that will help.  With regards to the dizziness treating that otitis externa helps that piece.  He is to complete antibiotics for the prostatitis as well.  I will repeat his metabolic panel.  I did consider CT scan but he has a benign abdomen so we will hold off on this and see how he does over the weekend.  In General most of his symptoms have improved

## 2019-04-22 LAB — BASIC METABOLIC PANEL
BUN: 10 mg/dL (ref 7–25)
CO2: 23 mmol/L (ref 20–32)
Calcium: 10.1 mg/dL (ref 8.6–10.3)
Chloride: 103 mmol/L (ref 98–110)
Creat: 1.07 mg/dL (ref 0.70–1.25)
Glucose, Bld: 94 mg/dL (ref 65–99)
Potassium: 4.2 mmol/L (ref 3.5–5.3)
Sodium: 138 mmol/L (ref 135–146)

## 2019-04-22 LAB — MICROALBUMIN / CREATININE URINE RATIO
Creatinine, Urine: 87 mg/dL (ref 20–320)
Microalb Creat Ratio: 18 mcg/mg creat (ref <30)
Microalb, Ur: 1.6 mg/dL

## 2019-04-24 ENCOUNTER — Other Ambulatory Visit: Payer: Self-pay

## 2019-04-24 ENCOUNTER — Telehealth: Payer: Self-pay

## 2019-04-24 ENCOUNTER — Ambulatory Visit (HOSPITAL_COMMUNITY)
Admission: RE | Admit: 2019-04-24 | Discharge: 2019-04-24 | Disposition: A | Discharge status: Home / Self Care | Payer: Commercial Managed Care - PPO | Source: Ambulatory Visit | Attending: Family Medicine | Admitting: Family Medicine

## 2019-04-24 DIAGNOSIS — E860 Dehydration: Secondary | ICD-10-CM

## 2019-04-24 DIAGNOSIS — N410 Acute prostatitis: Secondary | ICD-10-CM | POA: Diagnosis present

## 2019-04-24 DIAGNOSIS — R109 Unspecified abdominal pain: Secondary | ICD-10-CM

## 2019-04-24 MED ORDER — IOHEXOL 300 MG/ML  SOLN
100.0000 mL | Freq: Once | INTRAMUSCULAR | Status: AC | PRN
  Administered 2019-04-24: 100 mL via INTRAVENOUS

## 2019-04-24 MED ORDER — IOHEXOL 9 MG/ML PO SOLN: 500.0000 mL | ORAL | Status: AC

## 2019-04-24 MED ORDER — METRONIDAZOLE 500 MG PO TABS: 500.0000 mg | ORAL_TABLET | Freq: Three times a day (TID) | ORAL | 0 refills | Status: DC

## 2019-04-24 MED ORDER — LEVOFLOXACIN 750 MG PO TABS: 750.0000 mg | ORAL_TABLET | Freq: Every day | ORAL | 0 refills | Status: DC

## 2019-04-24 NOTE — Telephone Encounter (Signed)
This patient is going to [facility] for his CT.

## 2019-04-24 NOTE — Telephone Encounter (Signed)
RECURRENT ABD PAIN Completed antibiotics for prostatitis ongoing pain  Obtain CT scan like we discussed

## 2019-04-24 NOTE — Telephone Encounter (Signed)
Pt called to report pain is still present in L side. Pt states he felt better after the IV and had gotten some energy back. Pt also states that on Saturday, he started feeling the same as before visit. Pt also reports edema in both ankles.

## 2019-04-28 ENCOUNTER — Ambulatory Visit: Payer: Commercial Managed Care - PPO | Admitting: Family Medicine

## 2019-04-28 ENCOUNTER — Telehealth: Payer: Self-pay | Admitting: Family Medicine

## 2019-04-28 DIAGNOSIS — I723 Aneurysm of iliac artery: Secondary | ICD-10-CM

## 2019-04-28 LAB — PROTIME-INR

## 2019-04-28 MED ORDER — ONDANSETRON 4 MG PO TBDP: 4.0000 mg | ORAL_TABLET | Freq: Three times a day (TID) | ORAL | 0 refills | Status: DC | PRN

## 2019-04-28 NOTE — Telephone Encounter (Signed)
I spoke with patient regarding his CT results.  Went over them in detail.  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  For his diverticulitis his pain has improved today.  He was having significant nausea with both antibiotics we will send him in Zofran. If his pain does not improve I will then get him appointment with gastroenterology.  Also noted he had aortic atherosclerosis noted on his scan he is currently on TriCor had an allergy with Lipitor.  However with this finding we will have him come and have repeat lipids is still quite elevated may need to try statin 2-3 times a week

## 2019-05-01 ENCOUNTER — Other Ambulatory Visit: Payer: Self-pay | Admitting: *Deleted

## 2019-05-01 DIAGNOSIS — I700 Atherosclerosis of aorta: Secondary | ICD-10-CM | POA: Insufficient documentation

## 2019-05-01 DIAGNOSIS — E782 Mixed hyperlipidemia: Secondary | ICD-10-CM

## 2019-05-15 ENCOUNTER — Other Ambulatory Visit: Payer: Commercial Managed Care - PPO

## 2019-05-15 ENCOUNTER — Other Ambulatory Visit: Payer: Self-pay

## 2019-05-15 DIAGNOSIS — I700 Atherosclerosis of aorta: Secondary | ICD-10-CM

## 2019-05-15 DIAGNOSIS — E782 Mixed hyperlipidemia: Secondary | ICD-10-CM

## 2019-05-16 LAB — CBC WITH DIFFERENTIAL/PLATELET
Absolute Monocytes: 456 cells/uL (ref 200–950)
Basophils Absolute: 38 cells/uL (ref 0–200)
Basophils Relative: 0.8 %
Eosinophils Absolute: 110 cells/uL (ref 15–500)
Eosinophils Relative: 2.3 %
HCT: 41.6 % (ref 38.5–50.0)
Hemoglobin: 14.2 g/dL (ref 13.2–17.1)
Lymphs Abs: 1478 cells/uL (ref 850–3900)
MCH: 32.7 pg (ref 27.0–33.0)
MCHC: 34.1 g/dL (ref 32.0–36.0)
MCV: 95.9 fL (ref 80.0–100.0)
MPV: 11 fL (ref 7.5–12.5)
Monocytes Relative: 9.5 %
Neutro Abs: 2717 cells/uL (ref 1500–7800)
Neutrophils Relative %: 56.6 %
Platelets: 216 10*3/uL (ref 140–400)
RBC: 4.34 10*6/uL (ref 4.20–5.80)
RDW: 12.9 % (ref 11.0–15.0)
Total Lymphocyte: 30.8 %
WBC: 4.8 10*3/uL (ref 3.8–10.8)

## 2019-05-16 LAB — LIPID PANEL
Cholesterol: 224 mg/dL — ABNORMAL HIGH (ref <200)
HDL: 61 mg/dL (ref >=40)
LDL Cholesterol (Calc): 144 mg/dL (calc) — ABNORMAL HIGH
Non-HDL Cholesterol (Calc): 163 mg/dL (calc) — ABNORMAL HIGH (ref <130)
Total CHOL/HDL Ratio: 3.7 (calc) (ref <5.0)
Triglycerides: 85 mg/dL (ref <150)

## 2019-05-19 ENCOUNTER — Other Ambulatory Visit: Payer: Self-pay | Admitting: *Deleted

## 2019-05-19 MED ORDER — PRAVASTATIN SODIUM 40 MG PO TABS: 40.0000 mg | ORAL_TABLET | ORAL | 3 refills | Status: DC

## 2019-06-02 ENCOUNTER — Other Ambulatory Visit: Payer: Self-pay

## 2019-06-02 ENCOUNTER — Ambulatory Visit (INDEPENDENT_AMBULATORY_CARE_PROVIDER_SITE_OTHER): Payer: Commercial Managed Care - PPO | Admitting: Family Medicine

## 2019-06-02 VITALS — BP 122/66 | HR 97 | Temp 98.7°F | Resp 18 | Ht 67.0 in | Wt 128.2 lb

## 2019-06-02 DIAGNOSIS — G8929 Other chronic pain: Secondary | ICD-10-CM

## 2019-06-02 DIAGNOSIS — R519 Headache, unspecified: Secondary | ICD-10-CM | POA: Diagnosis not present

## 2019-06-02 DIAGNOSIS — R000 Tachycardia, unspecified: Secondary | ICD-10-CM

## 2019-06-02 DIAGNOSIS — I779 Disorder of arteries and arterioles, unspecified: Secondary | ICD-10-CM

## 2019-06-02 DIAGNOSIS — I700 Atherosclerosis of aorta: Secondary | ICD-10-CM

## 2019-06-02 NOTE — Patient Instructions (Addendum)
MRI to be done Referral to cardiology Carotid ultrasound to be done  Sign release for [facility] ER visit  F/U pending results

## 2019-06-02 NOTE — Progress Notes (Signed)
Subjective:    Patient ID: Kyle Rivers, male    DOB: [birth year], 64 y.o.   MRN: [id number]  Patient presents for Pain (had ear infection and still have pain on side of head) and Diverticulitis (having bad acid reflux since dx)   Pt here with continued abnormal sensation on right side of head. Had previous ear infection when he had in the past. He was seen by ENT on Monday and told infection in his ear was gone. He feels pressure on the ear and the sensation is like a crawling sensation. He used sweet oil in the ear.  He states he also remembers being told he had some blockage in the artery in the back of his head??   Now having acid reflux since he was on the antibiotics for the diverticulitis   He hasa had a spells when he is holding his head down, his BP tends to go up a little and HR goes up above  100, highest was  120  He went to ER at [facility] with chest pain and GI upset a few weeks ago , nothing found but told to f/u with cardiology, he thought the vascular appt was cardiology so he did not schedule anything  Taking protonix, he drinking a tea / GI enzymes, has chronic nausea since the diverticulitis, but bowels are back to normal  He is now back on probitics which has helped    He needs to be scheduled with Dr. Harl Bowie    Review Of Systems:  GEN- denies fatigue, fever, weight loss,weakness, recent illness HEENT- denies eye drainage, change in vision, nasal discharge, CVS- denies chest pain,+ palpitations RESP- denies SOB, cough, wheeze ABD- denies N/V, change in stools, abd pain GU- denies dysuria, hematuria, dribbling, incontinence MSK- denies joint pain, muscle aches, injury Neuro-+ headache, denies dizziness, syncope, seizure activity       Objective:    BP 122/66 (BP Location: Right Arm, Patient Position: Sitting, Cuff Size: Normal)   Pulse 97   Temp 98.7 F (37.1 C) (Oral)   Resp 18   Ht 5\' 7"  (1.702 m)   Wt 128 lb 3.2 oz (58.2 kg)   SpO2 97%   BMI 20.08  kg/m  GEN- NAD, alert and oriented x3 HEENT- PERRL, EOMI, non injected sclera, pink conjunctiva, MMM, oropharynx clear, TM clear no effusion Neck- Supple, no thyromegaly CVS- RRR, no murmur RESP-CTAB ABD-NABS,soft,NT,ND NEURO-CNII-XII in tact, no focal deficits  EXT- No edema Pulses- Radial  2+        Assessment & Plan:      Problem List Items Addressed This Visit      Unprioritized   Aortic atherosclerosis ([place]) - Primary    Pt has risk factors for heart disease/stroke Has known aortic athersclerosis picked up on recent CT scan in setting of hyperlipidemia He does have history of some carotid artery disease Unclear the headache sensations, so ear infection, no typical TMJ dysunction, not classic of an arterititis Obtain MRI brain Carotid US Referral to cardiology for the BP and palpitations Note also being seen by vascular for iliac anuersym      Relevant Orders   Ambulatory referral to Cardiology   US Carotid Duplex Bilateral   Carotid artery disease ([place])   Relevant Orders   Ambulatory referral to Cardiology   US Carotid Duplex Bilateral   MR Brain Wo Contrast    Other Visit Diagnoses    Tachycardia       Relevant Orders  Ambulatory referral to Cardiology   Chronic nonintractable headache, unspecified headache type       Relevant Orders   MR Brain Wo Contrast      Note: This dictation was prepared with Dragon dictation along with smaller phrase technology. Any transcriptional errors that result from this process are unintentional.

## 2019-06-04 ENCOUNTER — Encounter: Payer: Self-pay | Admitting: Family Medicine

## 2019-06-04 NOTE — Assessment & Plan Note (Addendum)
Pt has risk factors for heart disease/stroke Has known aortic athersclerosis picked up on recent CT scan in setting of hyperlipidemia He does have history of some carotid artery disease Unclear the headache sensations, so ear infection, no typical TMJ dysunction, not classic of an arterititis Obtain MRI brain- need to look for signs of stroke/ischemia Carotid US Referral to cardiology for the BP and palpitations Note also being seen by vascular for iliac anuersym

## 2019-06-09 ENCOUNTER — Ambulatory Visit (HOSPITAL_COMMUNITY)
Admission: RE | Admit: 2019-06-09 | Discharge: 2019-06-09 | Disposition: A | Discharge status: Home / Self Care | Payer: Commercial Managed Care - PPO | Source: Ambulatory Visit | Attending: Family Medicine | Admitting: Family Medicine

## 2019-06-09 ENCOUNTER — Other Ambulatory Visit: Payer: Self-pay

## 2019-06-09 DIAGNOSIS — I700 Atherosclerosis of aorta: Secondary | ICD-10-CM

## 2019-06-09 DIAGNOSIS — I779 Disorder of arteries and arterioles, unspecified: Secondary | ICD-10-CM | POA: Diagnosis present

## 2019-06-13 ENCOUNTER — Ambulatory Visit (INDEPENDENT_AMBULATORY_CARE_PROVIDER_SITE_OTHER): Payer: Commercial Managed Care - PPO | Admitting: Cardiology

## 2019-06-13 ENCOUNTER — Telehealth: Payer: Self-pay | Admitting: Family Medicine

## 2019-06-13 ENCOUNTER — Other Ambulatory Visit: Payer: Self-pay

## 2019-06-13 ENCOUNTER — Encounter: Payer: Self-pay | Admitting: Cardiology

## 2019-06-13 VITALS — BP 141/85 | HR 84 | Temp 97.8°F | Ht 67.0 in | Wt 129.0 lb

## 2019-06-13 DIAGNOSIS — I779 Disorder of arteries and arterioles, unspecified: Secondary | ICD-10-CM | POA: Diagnosis not present

## 2019-06-13 MED ORDER — METOPROLOL TARTRATE 25 MG PO TABS: 25.0000 mg | ORAL_TABLET | Freq: Two times a day (BID) | ORAL | 3 refills | Status: DC

## 2019-06-13 NOTE — Telephone Encounter (Signed)
Ance calling about recent test results  [phone]

## 2019-06-13 NOTE — Telephone Encounter (Signed)
Per PCP, await results of MRI on 06/15/2019, then discuss all results with patient.

## 2019-06-13 NOTE — Patient Instructions (Signed)
Medication Instructions:  START LOPRESSR 25 MG -TWO TIMES DAILY   Labwork: NONE  Testing/Procedures: NONE  Follow-Up: Your physician recommends that you schedule a follow-up appointment in: 4 MONTHS    Any Other Special Instructions Will Be Listed Below (If Applicable).     If you need a refill on your cardiac medications before your next appointment, please call your pharmacy.

## 2019-06-13 NOTE — Progress Notes (Signed)
Clinical Summary Kyle Rivers is a 64 y.o.male seen today for follow up of the following medical problem.s   1. Fatigue/SOB/Palpitations - - symptoms have resolved - has increased his oral hydration and symptoms have improved. Dizziness has resolved - palpitations have resolved.  -03/2018 echo LVEF 65-70%, no WMAs, grade I diastolic dysfunction [id number] nuclear stress: no ischemia   03/2018 monitor: Min HR 85, Max HR 193, Avg HR 85 Available strips show SVT up to 10 beats, NSVT up to 7 beats.    - home monitor shows HRs up 120 with mild activities.  - +palpitations. Lasts a few minutes - decaf coffee x 1 cup, no sodas, occasoinal tea, no energy drinks, no EtoH    2. HTN  - home have been bp's 130s/80s. Some recents bp's 160s when he is feeling his palpitations  3. Aortic atherosclerosis - noted by CT scan - on statin  4. Carotid stenosis - mild bilateral disease -on statin  5. Hyperlipidemia - takes pravastatin 40mg  three times a week. Just started Jan 4.  - rash on lipitor, just started on low dose pravastatin to see how tolerated  6. Diverticulitis   Past Medical History:  Diagnosis Date  . BPH (benign prostatic hypertrophy)   . 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 Ear infection 08/12/2016   pt believes he may be coming down with an ear infection today  . Hemorrhoids   . Hyperlipidemia   . Hypertension   . LVH (left ventricular hypertrophy)    LVH variant, EKG show ST elevation, normal cardiac catherization 2009  . Prostatitis      Allergies  Allergen Reactions  . Oxycodone     " makes me sick with nausea "  . Lipitor [Atorvastatin] Rash and Other (See Comments)    "Bumps showed up on skin "     Current Outpatient Medications  Medication Sig Dispense Refill  . amLODipine (NORVASC) 10 MG tablet TAKE 1 TABLET BY MOUTH  DAILY 90 tablet 3  . Co-Enzyme Q-10 100 MG CAPS Take 1 capsule by mouth daily.    . fenofibrate (TRICOR)  48 MG tablet TAKE 1 TABLET BY MOUTH  DAILY 90 tablet 3  . fish oil-omega-3 fatty acids 1000 MG capsule Take 2 g by mouth daily.     . Menthol-Methyl Salicylate (MUSCLE RUB) 10-15 % CREA Apply 1 application topically as needed for muscle pain.    Marland Kitchen ondansetron (ZOFRAN ODT) 4 MG disintegrating tablet Take 1 tablet (4 mg total) by mouth every 8 (eight) hours as needed for nausea or vomiting. 20 tablet 0  . OVER THE COUNTER MEDICATION See admin instructions. Genius brand Digestion Opitimizer. Take 3-4 capsules daily.    . pantoprazole (PROTONIX) 40 MG tablet TAKE 1 TABLET BY MOUTH  DAILY 90 tablet 3  . pravastatin (PRAVACHOL) 40 MG tablet Take 1 tablet (40 mg total) by mouth 3 (three) times a week. 90 tablet 3  . Probiotic Product (ACIDOPHILUS/BIFIDUS PO) Take 1 capsule by mouth daily.    . tamsulosin (FLOMAX) 0.4 MG CAPS capsule TAKE 1 CAPSULE BY MOUTH  DAILY 90 capsule 3  . TURMERIC PO Take 1,000 mg by mouth daily.      No current facility-administered medications for this visit.     Past Surgical History:  Procedure Laterality Date  . ANTERIOR CERVICAL DECOMP/DISCECTOMY FUSION N/A 08/12/2016   Procedure: C5-6, C6-7 ANTERIOR CERVICAL DECOMPRESSION/DISCECTOMY FUSION 2 LEVELS, ALLOGRAFT, PLATE;  Surgeon:  Marybelle Killings, MD;  Location: [facility];  Service: Orthopedics;  Laterality: N/A;  . CARDIAC CATHETERIZATION    . CERVICAL DISC SURGERY  08/13/2015   c 5 c6 c7  . COLONOSCOPY    . COLONOSCOPY N/A 10/02/2016   Procedure: COLONOSCOPY;  Surgeon: Rogene Houston, MD;  [facility];  Service: Endoscopy;  Laterality: N/A;  12:45  . HEMORRHOID SURGERY    . POLYPECTOMY  10/02/2016   Procedure: POLYPECTOMY;  Surgeon: Rogene Houston, MD;  [facility];  Service: Endoscopy;;  colon  . UPPER GASTROINTESTINAL ENDOSCOPY       Allergies  Allergen Reactions  . Oxycodone     " makes me sick with nausea "  . Lipitor [Atorvastatin] Rash and Other (See Comments)    "Bumps showed up on  skin "      Family History  Problem Relation Age of Onset  . Stroke Mother   . Hypertension Father   . Diabetes Father   . Prostate cancer Father   . Diabetes Sister   . Diabetes Sister      Social History Kyle Rivers reports that he has quit smoking. His smoking use included cigarettes. He has a 15.00 pack-year smoking history. He has quit using smokeless tobacco. Kyle Rivers reports current alcohol use.   Review of Systems CONSTITUTIONAL: No weight loss, fever, chills, weakness or fatigue.  HEENT: Eyes: No visual loss, blurred vision, double vision or yellow sclerae.No hearing loss, sneezing, congestion, runny nose or sore throat.  SKIN: No rash or itching.  CARDIOVASCULAR: per hpi RESPIRATORY: No shortness of breath, cough or sputum.  GASTROINTESTINAL: No anorexia, nausea, vomiting or diarrhea. No abdominal pain or blood.  GENITOURINARY: No burning on urination, no polyuria NEUROLOGICAL: No headache, dizziness, syncope, paralysis, ataxia, numbness or tingling in the extremities. No change in bowel or bladder control.  MUSCULOSKELETAL: No muscle, back pain, joint pain or stiffness.  LYMPHATICS: No enlarged nodes. No history of splenectomy.  PSYCHIATRIC: No history of depression or anxiety.  ENDOCRINOLOGIC: No reports of sweating, cold or heat intolerance. No polyuria or polydipsia.  Marland Kitchen   Physical Examination Today's Vitals   06/13/19 1542  BP: (!) 141/85  Pulse: 84  Temp: 97.8 F (36.6 C)  SpO2: 99%  Weight: 129 lb (58.5 kg)  Height: 5\' 7"  (1.702 m)   Body mass index is 20.2 kg/m.  Gen: resting comfortably, no acute distress HEENT: no scleral icterus, pupils equal round and reactive, no palptable cervical adenopathy,  CV: RRR, no m/r/g, no jvd Resp: Clear to auscultation bilaterally GI: abdomen is soft, non-tender, non-distended, normal bowel sounds, no hepatosplenomegaly MSK: extremities are warm, no edema.  Skin: warm, no rash Neuro:  no focal deficits Psych:  appropriate affect   Diagnostic Studies 03/2018 echo Study Conclusions  - Left ventricle: The cavity size was normal. Wall thickness was increased in a pattern of moderate LVH. Systolic function was vigorous. The estimated ejection fraction was in the range of 65% to 70%. Wall motion was normal; there were no regional wall motion abnormalities. Doppler parameters are consistent with abnormal left ventricular relaxation (grade 1 diastolic dysfunction). Doppler parameters are consistent with indeterminate ventricular filling pressure. - Mitral valve: There was mild regurgitation. - Tricuspid valve: There was mild regurgitation.  03/2018 nuclear stress  Blood pressure demonstrated a hypertensive response to exercise.  LVH with diffuse repolarization abnormalities seen throughout study.  The study is normal. No evidence of myocardial ischemia or scar.  This  is a low risk study.  Nuclear stress EF: 53%.    Assessment and Plan  1. Palpitations/SOB - monitor shows some NSVT and PSVT - echo and nuclear stress do not show any significant underlying heart disease - recent palpitations, we will start lopressor 25mg  bid - EKG today shows SR, chornic ST/T changes  2. HTN - above goal at times, follow with starting lopressor  3. 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 No new chest pains - continue statin, do not see strong indication for daily ASA at this time.   4. Hyperlipidemia - rash on atorvastatin - just started on pravastatin 3 times a week, follow tolerance. May titrate over time pending how well tolerated and future lipid panels   F/u 4 months      Arnoldo Lenis, M.D

## 2019-06-15 ENCOUNTER — Ambulatory Visit (HOSPITAL_COMMUNITY)
Admission: RE | Admit: 2019-06-15 | Discharge: 2019-06-15 | Disposition: A | Discharge status: Home / Self Care | Payer: Commercial Managed Care - PPO | Source: Ambulatory Visit | Attending: Family Medicine | Admitting: Family Medicine

## 2019-06-15 ENCOUNTER — Other Ambulatory Visit: Payer: Self-pay

## 2019-06-15 DIAGNOSIS — I779 Disorder of arteries and arterioles, unspecified: Secondary | ICD-10-CM | POA: Insufficient documentation

## 2019-06-15 DIAGNOSIS — R519 Headache, unspecified: Secondary | ICD-10-CM | POA: Diagnosis present

## 2019-06-15 DIAGNOSIS — G8929 Other chronic pain: Secondary | ICD-10-CM | POA: Diagnosis present

## 2019-06-16 ENCOUNTER — Other Ambulatory Visit: Payer: Self-pay | Admitting: Family Medicine

## 2019-06-16 MED ORDER — PRAVASTATIN SODIUM 40 MG PO TABS: 40.0000 mg | ORAL_TABLET | Freq: Every day | ORAL | 3 refills | Status: DC

## 2019-06-19 ENCOUNTER — Telehealth: Payer: Self-pay | Admitting: *Deleted

## 2019-06-19 NOTE — Telephone Encounter (Signed)
Asking if he can take lopressor 25 mg three times daily. HR resting 70-80's. After mild activity HR 108-109. Reports feeling tired after HR increases. BP has been normal around 136/84. Reports feeling better since starting metoprolol tartrate 25 mg twice daily. Denies chest pain, dizziness or sob. Advised that message would be sent to provider. Advised that it will be tomorrow before he is contacted again. Verbalized understanding.

## 2019-06-20 NOTE — Telephone Encounter (Signed)
Patient informed and says he has been taking 3 per day for the past 2 days and says he has more energy and his HR is not increasing with mild activity. Today BP 132/76 & HR 75.

## 2019-06-20 NOTE — Telephone Encounter (Signed)
If still having palpitations can increase lopressor to 25mg  tid. Typically with activity the heart rate should increase some, so I would not increase it for HRs in low 100s with activity but if feeling heart skipping or fluttering can try increasing.   J Izabella Marcantel MD

## 2019-06-21 ENCOUNTER — Ambulatory Visit: Payer: Commercial Managed Care - PPO | Admitting: Neurology

## 2019-06-21 MED ORDER — METOPROLOL TARTRATE 25 MG PO TABS: 25.0000 mg | ORAL_TABLET | Freq: Three times a day (TID) | ORAL | 3 refills | Status: DC

## 2019-06-21 NOTE — Telephone Encounter (Signed)
New prescription sent to pharmacy 

## 2019-06-21 NOTE — Telephone Encounter (Signed)
Can stick with 3 times a day   Zandra Abts MD

## 2019-06-22 ENCOUNTER — Other Ambulatory Visit: Payer: Self-pay

## 2019-06-22 ENCOUNTER — Telehealth (HOSPITAL_COMMUNITY): Payer: Self-pay

## 2019-06-22 DIAGNOSIS — I723 Aneurysm of iliac artery: Secondary | ICD-10-CM

## 2019-06-22 NOTE — Telephone Encounter (Signed)

## 2019-06-23 ENCOUNTER — Ambulatory Visit (INDEPENDENT_AMBULATORY_CARE_PROVIDER_SITE_OTHER): Payer: Commercial Managed Care - PPO | Admitting: Vascular Surgery

## 2019-06-23 ENCOUNTER — Other Ambulatory Visit: Payer: Self-pay

## 2019-06-23 ENCOUNTER — Ambulatory Visit (HOSPITAL_COMMUNITY)
Admission: RE | Admit: 2019-06-23 | Discharge: 2019-06-23 | Disposition: A | Discharge status: Home / Self Care | Payer: Commercial Managed Care - PPO | Source: Ambulatory Visit | Attending: Surgery | Admitting: Surgery

## 2019-06-23 ENCOUNTER — Encounter: Payer: Self-pay | Admitting: Vascular Surgery

## 2019-06-23 VITALS — BP 153/86 | HR 60 | Temp 97.7°F | Resp 20 | Ht 67.0 in | Wt 128.0 lb

## 2019-06-23 DIAGNOSIS — I700 Atherosclerosis of aorta: Secondary | ICD-10-CM

## 2019-06-23 DIAGNOSIS — I723 Aneurysm of iliac artery: Secondary | ICD-10-CM

## 2019-06-23 NOTE — Progress Notes (Signed)
Patient ID: Kyle Rivers, male   DOB: 08/27/1955, 64 y.o.   MRN: [id number]  Reason for Consult: New Patient (Initial Visit)   Referred by Alycia Rossetti, MD  Subjective:     HPI:  Kyle Rivers is a 64 y.o. male recently underwent CT scan for diverticulitis.  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  He does not have any history of vascular disease.  Is a former smoker.  Currently takes statin does not take aspirin.  States that he has had some rhythm disturbances with his heart otherwise no heart disease.  Risk factors include hypertension, hyperlipidemia and previous smoking.  Has never had vascular surgery.  Past Medical History:  Diagnosis Date  . BPH (benign prostatic hypertrophy)   . 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 Ear infection 08/12/2016   pt believes he may be coming down with an ear infection today  . Hemorrhoids   . Hyperlipidemia   . Hypertension   . LVH (left ventricular hypertrophy)    LVH variant, EKG show ST elevation, normal cardiac catherization 2009  . Prostatitis    Family History  Problem Relation Age of Onset  . Stroke Mother   . Hypertension Father   . Diabetes Father   . Prostate cancer Father   . Diabetes Sister   . Diabetes Sister    Past Surgical History:  Procedure Laterality Date  . ANTERIOR CERVICAL DECOMP/DISCECTOMY FUSION N/A 08/12/2016   Procedure: C5-6, C6-7 ANTERIOR CERVICAL DECOMPRESSION/DISCECTOMY FUSION 2 LEVELS, ALLOGRAFT, PLATE;  Surgeon: Marybelle Killings, MD;  Location: [facility];  Service: Orthopedics;  Laterality: N/A;  . CARDIAC CATHETERIZATION    . CERVICAL DISC SURGERY  08/13/2015   c 5 c6 c7  . COLONOSCOPY    . COLONOSCOPY N/A 10/02/2016   Procedure: COLONOSCOPY;  Surgeon: Rogene Houston, MD;  [facility];  Service: Endoscopy;  Laterality: N/A;  12:45  . dental inplants    . HEMORRHOID SURGERY    . POLYPECTOMY  10/02/2016   Procedure: POLYPECTOMY;  Surgeon:  Rogene Houston, MD;  [facility];  Service: Endoscopy;;  colon  . UPPER GASTROINTESTINAL ENDOSCOPY      Short Social History:  Social History   Tobacco Use  . Smoking status: Former Smoker    Packs/day: 0.50    Years: 30.00    Pack years: 15.00    Types: Cigarettes  . Smokeless tobacco: Former Systems developer  . Tobacco comment: smokes light, somedays none  Substance Use Topics  . Alcohol use: Yes    Comment: Occ    Allergies  Allergen Reactions  . Oxycodone     " makes me sick with nausea "  . Lipitor [Atorvastatin] Rash and Other (See Comments)    "Bumps showed up on skin "    Current Outpatient Medications  Medication Sig Dispense Refill  . amLODipine (NORVASC) 10 MG tablet TAKE 1 TABLET BY MOUTH  DAILY 90 tablet 3  . Co-Enzyme Q-10 100 MG CAPS Take 1 capsule by mouth daily.    . fenofibrate (TRICOR) 48 MG tablet TAKE 1 TABLET BY MOUTH  DAILY 90 tablet 3  . fish oil-omega-3 fatty acids 1000 MG capsule Take 2 g by mouth daily.     . Menthol-Methyl Salicylate (MUSCLE RUB) 10-15 % CREA Apply 1 application topically as needed for muscle pain.    . metoprolol tartrate (LOPRESSOR) 25 MG  tablet Take 1 tablet (25 mg total) by mouth 3 (three) times daily. 270 tablet 3  . ondansetron (ZOFRAN ODT) 4 MG disintegrating tablet Take 1 tablet (4 mg total) by mouth every 8 (eight) hours as needed for nausea or vomiting. 20 tablet 0  . OVER THE COUNTER MEDICATION See admin instructions. Genius brand Digestion Opitimizer. Take 3-4 capsules daily.    . pantoprazole (PROTONIX) 40 MG tablet TAKE 1 TABLET BY MOUTH  DAILY 90 tablet 3  . pravastatin (PRAVACHOL) 40 MG tablet Take 1 tablet (40 mg total) by mouth daily. 90 tablet 3  . Probiotic Product (ACIDOPHILUS/BIFIDUS PO) Take 1 capsule by mouth daily.    . tamsulosin (FLOMAX) 0.4 MG CAPS capsule TAKE 1 CAPSULE BY MOUTH  DAILY 90 capsule 3  . TURMERIC PO Take 1,000 mg by mouth daily.      No current facility-administered medications for  this visit.    Review of Systems  Constitutional:  Constitutional negative. HENT: HENT negative.  Eyes: Eyes negative.  Respiratory: Respiratory negative.  Cardiovascular: Cardiovascular negative.  GI: Gastrointestinal negative.  Musculoskeletal: Musculoskeletal negative.  Skin: Skin negative.  Neurological: Neurological negative. Hematologic: Hematologic/lymphatic negative.  Psychiatric: Psychiatric negative.        Objective:  Objective   There were no vitals filed for this visit. There is no height or weight on file to calculate BMI.  Physical Exam HENT:     Head: Normocephalic.     Nose:     Comments: Mask in place Neck:     Vascular: No carotid bruit.  Cardiovascular:     Rate and Rhythm: Normal rate and regular rhythm.     Pulses:          Popliteal pulses are 2+ on the right side and 2+ on the left side.     Heart sounds: Normal heart sounds.  Pulmonary:     Effort: Pulmonary effort is normal.  Abdominal:     General: Abdomen is flat.     Palpations: Abdomen is soft. There is no mass.  Musculoskeletal:        General: No swelling. Normal range of motion.     Cervical back: Normal range of motion.  Skin:    General: Skin is warm and dry.     Capillary Refill: Capillary refill takes less than 2 seconds.  Neurological:     General: No focal deficit present.     Mental Status: He is alert.  Psychiatric:        Mood and Affect: Mood normal.        Thought Content: Thought content normal.        Judgment: Judgment normal.     Data: I have independently interpreted his a abdominal aortic study which demonstrates greatest diameter of his aorta to be 3.16 cm with triphasic flow.  Right common iliac artery measures 2.1 cm distally.  Left common iliac artery 1.6 cm.   I reviewed the patient's CT scan with him which demonstrates a focal outpouching of his distal right common iliac artery that measured approximately 2 cm.  This was slightly increased in size  from the study in 2015.      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  Otherwise his arteries look relatively healthy with mild aortic atherosclerosis.  I have recommended him to start baby aspirin daily as he already takes statin.  We will follow him up in 2 years  with repeat duplex of his aorta and iliac arteries.     Waynetta Sandy MD [facility]

## 2019-06-26 ENCOUNTER — Telehealth: Payer: Commercial Managed Care - PPO | Admitting: Cardiology

## 2019-06-27 ENCOUNTER — Other Ambulatory Visit: Payer: Self-pay | Admitting: *Deleted

## 2019-06-27 DIAGNOSIS — I700 Atherosclerosis of aorta: Secondary | ICD-10-CM

## 2019-06-27 DIAGNOSIS — I723 Aneurysm of iliac artery: Secondary | ICD-10-CM

## 2019-07-20 ENCOUNTER — Other Ambulatory Visit: Payer: Self-pay

## 2019-07-20 ENCOUNTER — Encounter: Payer: Self-pay | Admitting: Neurology

## 2019-07-20 ENCOUNTER — Ambulatory Visit (INDEPENDENT_AMBULATORY_CARE_PROVIDER_SITE_OTHER): Payer: Commercial Managed Care - PPO | Admitting: Neurology

## 2019-07-20 VITALS — BP 145/90 | HR 83 | Temp 97.9°F | Ht 67.0 in | Wt 134.5 lb

## 2019-07-20 DIAGNOSIS — Z8669 Personal history of other diseases of the nervous system and sense organs: Secondary | ICD-10-CM | POA: Diagnosis not present

## 2019-07-20 DIAGNOSIS — R202 Paresthesia of skin: Secondary | ICD-10-CM

## 2019-07-20 DIAGNOSIS — R519 Headache, unspecified: Secondary | ICD-10-CM | POA: Diagnosis not present

## 2019-07-20 NOTE — Patient Instructions (Addendum)
We will do a brain scan, called MRI with and without contrast, and call you with the test results. We will have to schedule you for this on a separate date. This test requires authorization from your insurance, and we will take care of the insurance process. You had a brain MRI last month without contrast and it did not show any acute abnormalities.  Your exam is good today. I would recommend follow up with your ENT. We will check blood work today and call you with the test results.  So long as your results are benign, I will see you back as needed.

## 2019-07-20 NOTE — Progress Notes (Signed)
Subjective:    Patient ID: Kyle Rivers, male    DOB: Nov 17, 1955, 64 y.o.   MRN: [id number]  HPI   History:   Dear Dr. Brien Mates,   I saw your patient, Kyle Rivers, upon your kind request in my neurologic clinic today for initial consultation of his recurrent headaches.  The patient is unaccompanied today.  As you know, Kyle Rivers is a 64 year old right-handed gentleman with an underlying medical history of hypertension, Carotid artery diseasehyperlipidemia, degenerative joint disease, BPH and history of prostatitis, left ventricular hypertrophy, recurrent ear infections, history of tinnitus, reflux disease, arthritis, status post neck surgery, who reports recurrent abnormal sensations affecting his scalp area.  This feels like a crawling sensation even a tingling sensation at times, no actual pain, no burning sensation, no lightninglike pain.  It is mostly on the top of his head and the back of his head sometimes in the very center.  It has been ongoing for months, he felt it worse when he first started having issues with recurrent ear infections.  He feels that overall the sensation is less.  It is not painful, he does not take any medicines for it.  He denies any other paresthesias elsewhere, no one-sided weakness or numbness or droopy face or slurring of speech.  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  He reports that he had received shots in the right eye.  He denies any pain in the temporal area or jaw.  He sleeps fairly well, he denies any choking sensations, morning headaches, or witnessed apneas.  He does snore.  He tries to hydrate well with water.  He had neck surgery several years ago without any obvious sequelae.  He does not drink alcohol, he does not drink caffeine daily, he quit smoking 3 years ago.  Some months ago he had issues with diverticulitis.  A year ago he had a similar feeling in the head and was told he was dehydrated.  I reviewed your office note from  06/13/2019. He had a recent carotid Doppler ultrasound on 06/09/2019 and I reviewed the results: IMPRESSION: 1. Mild (1-49%) stenosis proximal right internal carotid artery secondary to secondary to focal heterogeneous and slightly irregular atherosclerotic plaque. 2. Mild (1-49%) stenosis proximal left internal carotid artery secondary to minimal smooth heterogeneous atherosclerotic plaque. 3. The vertebral arteries are patent with normal antegrade flow.  His primary care physician ordered a brain MRI recently.  He had a brain MRI without contrast on 06/15/2019 and I reviewed the results: IMPRESSION: 1. No acute intracranial abnormality. 2. Few scattered foci of white matter signal abnormality, nonspecific most likely related to chronic small vessel ischemia.   His Past Medical History Is Significant For: Past Medical History:  Diagnosis Date  . BPH (benign prostatic hypertrophy)   . 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 Ear infection 08/12/2016   pt believes he may be coming down with an ear infection today  . Hemorrhoids   . Hyperlipidemia   . Hypertension   . LVH (left ventricular hypertrophy)    LVH variant, EKG show ST elevation, normal cardiac catherization 2009  . Prostatitis     His Past Surgical History Is Significant For: Past Surgical History:  Procedure Laterality Date  . ANTERIOR CERVICAL DECOMP/DISCECTOMY FUSION N/A 08/12/2016   Procedure: C5-6, C6-7 ANTERIOR CERVICAL DECOMPRESSION/DISCECTOMY FUSION 2 LEVELS, ALLOGRAFT, PLATE;  Surgeon: Marybelle Killings, MD;  Location: [facility];  Service: Orthopedics;  Laterality: N/A;  . CARDIAC CATHETERIZATION    . CERVICAL DISC SURGERY  08/13/2015   c 5 c6 c7  . COLONOSCOPY    . COLONOSCOPY N/A 10/02/2016   Procedure: COLONOSCOPY;  Surgeon: Rogene Houston, MD;  [facility];  Service: Endoscopy;  Laterality: N/A;  12:45  . dental inplants    . HEMORRHOID SURGERY    . POLYPECTOMY  10/02/2016   Procedure:  POLYPECTOMY;  Surgeon: Rogene Houston, MD;  Location: [facility];  Service: Endoscopy;;  colon  . UPPER GASTROINTESTINAL ENDOSCOPY      His Family History Is Significant For: Family History  Problem Relation Age of Onset  . Stroke Mother   . Hypertension Father   . Diabetes Father   . Prostate cancer Father   . Diabetes Sister   . Diabetes Sister     His Social History Is Significant For: Social History   Socioeconomic History  . Marital status: Married    Spouse name: Not on file  . Number of children: Not on file  . Years of education: Not on file  . Highest education level: Not on file  Occupational History  . Not on file  Tobacco Use  . Smoking status: Former Smoker    Packs/day: 0.50    Years: 30.00    Pack years: 15.00    Types: Cigarettes  . Smokeless tobacco: Former Systems developer  . Tobacco comment: smokes light, somedays none  Substance and Sexual Activity  . Alcohol use: Yes    Comment: Occ  . Drug use: No  . Sexual activity: Yes  Other Topics Concern  . Not on file  Social History Narrative  . Not on file   Social Determinants of Health   Financial Resource Strain:   . Difficulty of Paying Living Expenses:   Food Insecurity:   . Worried About Charity fundraiser in the Last Year:   . Arboriculturist in the Last Year:   Transportation Needs:   . Film/video editor (Medical):   Marland Kitchen Lack of Transportation (Non-Medical):   Physical Activity:   . Days of Exercise per Week:   . Minutes of Exercise per Session:   Stress:   . Feeling of Stress :   Social Connections:   . Frequency of Communication with Friends and Family:   . Frequency of Social Gatherings with Friends and Family:   . Attends Religious Services:   . Active Member of Clubs or Organizations:   . Attends Archivist Meetings:   Marland Kitchen Marital Status:     His Allergies Are:  Allergies  Allergen Reactions  . Oxycodone     " makes me sick with nausea "  . Lipitor [Atorvastatin]  Rash and Other (See Comments)    "Bumps showed up on skin "  :   His Current Medications Are:  Outpatient Encounter Medications as of 07/20/2019  Medication Sig  . amLODipine (NORVASC) 10 MG tablet TAKE 1 TABLET BY MOUTH  DAILY  . Co-Enzyme Q-10 100 MG CAPS Take 1 capsule by mouth daily.  . fenofibrate (TRICOR) 48 MG tablet TAKE 1 TABLET BY MOUTH  DAILY  . fish oil-omega-3 fatty acids 1000 MG capsule Take 2 g by mouth daily.   . Menthol-Methyl Salicylate (MUSCLE RUB) 10-15 % CREA Apply 1 application topically as needed for muscle pain.  . metoprolol tartrate (LOPRESSOR) 25 MG tablet Take 1 tablet (25 mg total) by mouth 3 (three) times daily.  Marland Kitchen  ondansetron (ZOFRAN ODT) 4 MG disintegrating tablet Take 1 tablet (4 mg total) by mouth every 8 (eight) hours as needed for nausea or vomiting.  Marland Kitchen OVER THE COUNTER MEDICATION See admin instructions. Genius brand Digestion Opitimizer. Take 3-4 capsules daily.  . pantoprazole (PROTONIX) 40 MG tablet TAKE 1 TABLET BY MOUTH  DAILY  . pravastatin (PRAVACHOL) 40 MG tablet Take 1 tablet (40 mg total) by mouth daily.  . Probiotic Product (ACIDOPHILUS/BIFIDUS PO) Take 1 capsule by mouth daily.  . tamsulosin (FLOMAX) 0.4 MG CAPS capsule TAKE 1 CAPSULE BY MOUTH  DAILY  . TURMERIC PO Take 1,000 mg by mouth daily.    No facility-administered encounter medications on file as of 07/20/2019.  :  Review of Systems:  Out of a complete 14 point review of systems, all are reviewed and negative with the exception of these symptoms as listed below:  Review of Systems  Constitutional: Negative.   HENT: Negative.   Eyes: Negative.   Respiratory: Negative.   Cardiovascular: Negative.   Gastrointestinal: Negative.   Endocrine: Negative.   Musculoskeletal: Negative.   Skin: Negative.   Allergic/Immunologic: Negative.   Neurological: Positive for light-headedness.       Patient states its sensation in his head not really headache Patient had ear infection In right  ear was told its nerves for almost 5 months         Objective:   Physical Exam        Physical Examination:   Vitals:   07/20/19 1423  BP: (!) 145/90  Pulse: 83  Temp: 97.9 F (36.6 C)    General Examination: The patient is a very pleasant 64 y.o. male in no acute distress. He appears well-developed and well-nourished and well groomed.   HEENT: Normocephalic, atraumatic, pupils are equal, round and reactive to light and accommodation. Funduscopic exam is notable for some hyperpigmentation in the right retina.  No obvious papilledema, somewhat difficult exam secondary to smaller pupils and cataracts bilaterally.  Tympanic membranes are clear on the right but does have some scarring or mucosal tissue, could be residual fungal infection?  Left tympanic membrane is obscured secondary to cerumen impaction.  Scalp is easy to inspect as he is bald.  No obvious scar, no lesion.  No temporal tenderness, no palpable cord in the temples. Normal smooth pursuit is noted. Hearing is grossly intact. Tympanic membranes are clear bilaterally. Face is symmetric with normal facial animation and normal facial sensation. Speech is clear with no dysarthria noted. There is no hypophonia. There is no lip, neck/head, jaw or voice tremor. Neck is supple with full range of passive and active motion. There are no carotid bruits on auscultation. Oropharynx exam reveals: mild mouth dryness, adequate dental hygiene. Tongue protrudes centrally and palate elevates symmetrically.  Chest: Clear to auscultation without wheezing, rhonchi or crackles noted.  Heart: S1+S2+0, regular and normal without murmurs, rubs or gallops noted.   Abdomen: Soft, non-tender and non-distended with normal bowel sounds appreciated on auscultation.  Extremities: There is no pitting edema in the distal lower extremities bilaterally. Pedal pulses are intact.  Skin: Warm and dry without trophic changes noted.  Musculoskeletal: exam  reveals no obvious joint deformities, tenderness or joint swelling or erythema.   Neurologically:  Mental status: The patient is awake, alert and oriented in all 4 spheres. His immediate and remote memory, attention, language skills and fund of knowledge are appropriate. There is no evidence of aphasia, agnosia, apraxia or anomia. Speech is clear with normal  prosody and enunciation. Thought process is linear. Mood is normal and affect is normal.  Cranial nerves II - XII are as described above under HEENT exam. In addition: shoulder shrug is normal with equal shoulder height noted. Motor exam: Normal bulk, strength and tone is noted. There is no drift, tremor or rebound. Romberg is negative. Reflexes are 2+ throughout. Babinski: Toes are flexor bilaterally. Fine motor skills and coordination: intact with normal finger taps, normal hand movements, normal rapid alternating patting, normal foot taps and normal foot agility.  Cerebellar testing: No dysmetria or intention tremor on finger to nose testing. Heel to shin is unremarkable bilaterally. There is no truncal or gait ataxia.  Sensory exam: intact to light touch, vibration, temperature sense in the upper and lower extremities.  Gait, station and balance: He stands easily. No veering to one side is noted. No leaning to one side is noted. Posture is age-appropriate and stance is narrow based. Gait shows normal stride length and normal pace. No problems turning are noted. Tandem walk is unremarkable.  Assessment & Plan:  In summary, Kyle Rivers is a very pleasant 64 y.o.-year old male with an underlying medical history of hypertension, Carotid artery diseasehyperlipidemia, degenerative joint disease, BPH and history of prostatitis, left ventricular hypertrophy, recurrent ear infections, history of tinnitus, reflux disease, arthritis, status post neck surgery, who presents for evaluation of his abnormal sensations affecting the top of his head,  particularly in the center, and the scalp area as opposed to recurrent headaches.  He is not suffering from any painful sensations, feels more like a crawling sensation.  It is difficult for me to explain the symptoms, he does have a normal neurological exam and had a recent benign noncontrasted MRI.  His history and exam are not telltale for temporal arteritis or neuralgic pain or migraines.  He has no prior history of migraines.  Nevertheless, I suggested we proceed with some work-up from my end of things including blood work to look for inflammatory markers, thyroid screening test, B12 deficiency.  We will also proceed with a brain MRI with contrast to make sure there is no structural cause of his symptoms.  He is not keen on trying any medication as he does not suffer from any pain.  We mutually agreed to monitor his symptoms.  We will call him with his test results.  So long as his MRI and lab work are nonrevealing and stable, I suggested he follow-up with me on an as-needed basis.  I did notice some abnormality in both ear canals, left ear canal seems to be impacted with cerumen, right side has some possible scarring or soft tissue abnormality, may be residual fungal infection.  He is encouraged to make a follow-up appointment with your office for that reason.  I answered all his questions today and he was in agreement with the plan.    Thank you very much for allowing me to participate in the care of this nice patient. If I can be of any further assistance to you please do not hesitate to call me at [phone].  Sincerely,   Star Age, MD, PhD

## 2019-07-21 LAB — SEDIMENTATION RATE: Sed Rate: 23 mm/hr (ref 0–30)

## 2019-07-21 LAB — COMPREHENSIVE METABOLIC PANEL
ALT: 10 IU/L (ref 0–44)
AST: 18 IU/L (ref 0–40)
Albumin/Globulin Ratio: 1.3 (ref 1.2–2.2)
Albumin: 4.6 g/dL (ref 3.8–4.8)
Alkaline Phosphatase: 91 IU/L (ref 39–117)
BUN/Creatinine Ratio: 12 (ref 10–24)
BUN: 13 mg/dL (ref 8–27)
Bilirubin Total: 0.3 mg/dL (ref 0.0–1.2)
CO2: 22 mmol/L (ref 20–29)
Calcium: 10.3 mg/dL — ABNORMAL HIGH (ref 8.6–10.2)
Chloride: 101 mmol/L (ref 96–106)
Creatinine, Ser: 1.13 mg/dL (ref 0.76–1.27)
GFR calc Af Amer: 80 mL/min/{1.73_m2} (ref >59)
GFR calc non Af Amer: 69 mL/min/{1.73_m2} (ref >59)
Globulin, Total: 3.6 g/dL (ref 1.5–4.5)
Glucose: 88 mg/dL (ref 65–99)
Potassium: 4.3 mmol/L (ref 3.5–5.2)
Sodium: 142 mmol/L (ref 134–144)
Total Protein: 8.2 g/dL (ref 6.0–8.5)

## 2019-07-21 LAB — RHEUMATOID FACTOR: Rheumatoid fact SerPl-aCnc: <10 IU/mL (ref 0.0–13.9)

## 2019-07-21 LAB — B12 AND FOLATE PANEL
Folate: >20 ng/mL (ref >3.0)
Vitamin B-12: 1695 pg/mL — ABNORMAL HIGH (ref 232–1245)

## 2019-07-21 LAB — RPR: RPR Ser Ql: NONREACTIVE

## 2019-07-21 LAB — TSH: TSH: 1.68 u[IU]/mL (ref 0.450–4.500)

## 2019-07-21 LAB — C-REACTIVE PROTEIN: CRP: 4 mg/L (ref 0–10)

## 2019-07-21 LAB — ANA W/REFLEX: Anti Nuclear Antibody (ANA): NEGATIVE

## 2019-07-21 LAB — HGB A1C W/O EAG: Hgb A1c MFr Bld: 5.3 % (ref 4.8–5.6)

## 2019-07-24 ENCOUNTER — Telehealth: Payer: Self-pay

## 2019-07-24 DIAGNOSIS — R202 Paresthesia of skin: Secondary | ICD-10-CM

## 2019-07-24 DIAGNOSIS — Z8669 Personal history of other diseases of the nervous system and sense organs: Secondary | ICD-10-CM

## 2019-07-24 DIAGNOSIS — R519 Headache, unspecified: Secondary | ICD-10-CM

## 2019-07-24 NOTE — Telephone Encounter (Signed)
-----   Message from Star Age, MD sent at 07/24/2019  7:48 AM EDT ----- Blood tests were fine, just vit B12 elevated mildly; I believe, he takes B12 supplements. Please advise pt that he can talk to PCP about the B12 at the next appointment; he may be able to lower or skip it for now.

## 2019-07-24 NOTE — Telephone Encounter (Signed)
I called pt. No answer, left a message asking him to call me back. 

## 2019-07-24 NOTE — Progress Notes (Signed)
Blood tests were fine, just vit B12 elevated mildly; I believe, he takes B12 supplements. Please advise pt that he can talk to PCP about the B12 at the next appointment; he may be able to lower or skip it for now.

## 2019-07-26 NOTE — Telephone Encounter (Signed)
I called pt to discuss. No answer, left a message asking him to call me back. 

## 2019-07-27 NOTE — Telephone Encounter (Signed)
I called pt again to discuss. No answer, left a detailed message on his home/cell number per DPR, advising him of his lab results and recommendations. I asked pt to call me back with any questions or concerns.

## 2019-07-31 NOTE — Telephone Encounter (Signed)
Pt returned call. Please call back when available. 

## 2019-07-31 NOTE — Telephone Encounter (Signed)
MRI brain w wo contrast ordered

## 2019-07-31 NOTE — Telephone Encounter (Signed)
I called pt and discussed his lab results and recommendations. Pt is wondering when his MRI will be scheduled. I reminded him of the insurance auth process and that someone will call him to schedule. Pt verbalized understanding of results and recommendations.

## 2019-07-31 NOTE — Addendum Note (Signed)
Addended by: Star Age on: 07/31/2019 03:39 PM   Modules accepted: Orders

## 2019-08-07 NOTE — Telephone Encounter (Signed)
no to the covid questions MR Brain w/wo contrast Dr. Harolyn Rutherford Auth: [id number] (exp. 08/03/19 to 09/01/19). patient is scheduled at [facility] for 08/09/19

## 2019-08-09 ENCOUNTER — Other Ambulatory Visit: Payer: Self-pay

## 2019-08-09 ENCOUNTER — Ambulatory Visit: Payer: Commercial Managed Care - PPO

## 2019-08-09 DIAGNOSIS — R519 Headache, unspecified: Secondary | ICD-10-CM

## 2019-08-09 DIAGNOSIS — R202 Paresthesia of skin: Secondary | ICD-10-CM

## 2019-08-09 DIAGNOSIS — Z8669 Personal history of other diseases of the nervous system and sense organs: Secondary | ICD-10-CM

## 2019-08-09 MED ORDER — GADOBENATE DIMEGLUMINE 529 MG/ML IV SOLN
12.0000 mL | Freq: Once | INTRAVENOUS | Status: AC | PRN
  Administered 2019-08-09: 12 mL via INTRAVENOUS

## 2019-08-11 NOTE — Progress Notes (Signed)
Please call Pt: MRI brain w/wo contrast showed no acute findings; no abnormal contrast uptake and otherwise stable from prev. MRI br wo contrast.  Kyle Rivers

## 2019-08-14 ENCOUNTER — Telehealth: Payer: Self-pay

## 2019-08-14 NOTE — Telephone Encounter (Signed)
I called pt and discussed his MRI results and recommendations. Pt verbalized understanding of results. Pt had no questions at this time but was encouraged to call back if questions arise.

## 2019-08-14 NOTE — Telephone Encounter (Signed)
-----   Message from Star Age, MD sent at 08/11/2019 11:54 AM EDT ----- Please call Pt: MRI brain w/wo contrast showed no acute findings; no abnormal contrast uptake and otherwise stable from prev. MRI br wo contrast.  Michel Bickers

## 2019-08-15 ENCOUNTER — Ambulatory Visit: Payer: Commercial Managed Care - PPO | Admitting: Family Medicine

## 2019-09-05 ENCOUNTER — Ambulatory Visit (INDEPENDENT_AMBULATORY_CARE_PROVIDER_SITE_OTHER): Payer: Commercial Managed Care - PPO | Admitting: Family Medicine

## 2019-09-05 ENCOUNTER — Other Ambulatory Visit: Payer: Self-pay

## 2019-09-05 ENCOUNTER — Encounter: Payer: Self-pay | Admitting: Family Medicine

## 2019-09-05 VITALS — BP 136/82 | HR 78 | Temp 98.3°F | Resp 16 | Ht 67.0 in | Wt 135.0 lb

## 2019-09-05 DIAGNOSIS — Z789 Other specified health status: Secondary | ICD-10-CM

## 2019-09-05 DIAGNOSIS — M791 Myalgia, unspecified site: Secondary | ICD-10-CM | POA: Diagnosis not present

## 2019-09-05 NOTE — Patient Instructions (Addendum)
Stay off the statin drug  Push the fluids Add magnesium for leg cramps/tightness  We will call with lab results F/U  6 months for Physical

## 2019-09-05 NOTE — Progress Notes (Signed)
   Subjective:    Patient ID: Kyle Rivers, male    DOB: 07-Aug-1955, 64 y.o.   MRN: [id number]  Patient presents for Back Pain (x weeks- lower back pain radiating down BLE- feet, calves, thighs, etc)   Pt here post bilat leg pain for hte past few weeks. Initially started in his calves, now has pain in bilat post thigh and buttocks. , He has tighness feeling, mild cramps, no low back pain No tingling or numbness When he is laying no discomfort,  When he is walking he feels the pain more  He stopped the statin drug last Friday to see if that would help, still feels the pain He is still on tumeric He has not used any muscle rub  He has been taking bayer body and back  Np change in uriantion   He is seeing ENT in [place], treated for fungal infection for a few weeks   He did stop taking extra B12   Review Of Systems:  GEN- denies fatigue, fever, weight loss,weakness, recent illness HEENT- denies eye drainage, change in vision, nasal discharge, CVS- denies chest pain, palpitations RESP- denies SOB, cough, wheeze ABD- denies N/V, change in stools, abd pain GU- denies dysuria, hematuria, dribbling, incontinence MSK- denies joint pain, + muscle aches, injury Neuro- denies headache, dizziness, syncope, seizure activity       Objective:    BP 136/82   Pulse 78   Temp 98.3 F (36.8 C) (Temporal)   Resp 16   Ht 5\' 7"  (1.702 m)   Wt 135 lb (61.2 kg)   SpO2 97%   BMI 21.14 kg/m  GEN- NAD, alert and oriented x3 CVS- RRR, no murmur RESP-CTAB MSK - spine NT, FROM , good ROM hips/knees Neg homans , mild TTP post calves No varicose veins palpated  EXT- No edema Pulses- Radial,  2+        Assessment & Plan:      Problem List Items Addressed This Visit      Unprioritized   Statin intolerance    Other Visit Diagnoses    Myalgia    -  Primary   Pt with myalgias most likley releated to statin drug as he has had in the past. He did have COVID 19 vaccine about  3-4 weeks  ago, but would expect those next to have weaned off at this point.  He has been taking anti-inflammatory he stopped the statin drug and he has had some improvement.  We will check his CK level today as it is for some reason extremely high can and may add prednisone for inflammation.  It is normal or just borderline elevated we will have him continue with supportive care we discussed today which includes increasing his fluids for hydration stopping the statin drug of course can he can try oral magnesium for cramping. No red flags on exam   He is already been evaluated by vascular has good blood flow he has a small iliac aneurysm that needs to be followed with duplex every 2 years  Atherosclerosis.  Injectable such as Repatha but will defer this to cardiologist he has visit in June    Relevant Orders   Comprehensive metabolic panel   CK      Note: This dictation was prepared with Dragon dictation along with smaller phrase technology. Any transcriptional errors that result from this process are unintentional.

## 2019-09-06 LAB — COMPREHENSIVE METABOLIC PANEL
AG Ratio: 1.3 (calc) (ref 1.0–2.5)
ALT: 10 U/L (ref 9–46)
AST: 16 U/L (ref 10–35)
Albumin: 4.4 g/dL (ref 3.6–5.1)
Alkaline phosphatase (APISO): 74 U/L (ref 35–144)
BUN: 10 mg/dL (ref 7–25)
CO2: 25 mmol/L (ref 20–32)
Calcium: 10.1 mg/dL (ref 8.6–10.3)
Chloride: 102 mmol/L (ref 98–110)
Creat: 1.16 mg/dL (ref 0.70–1.25)
Globulin: 3.5 g/dL (calc) (ref 1.9–3.7)
Glucose, Bld: 127 mg/dL — ABNORMAL HIGH (ref 65–99)
Potassium: 4.4 mmol/L (ref 3.5–5.3)
Sodium: 138 mmol/L (ref 135–146)
Total Bilirubin: 0.7 mg/dL (ref 0.2–1.2)
Total Protein: 7.9 g/dL (ref 6.1–8.1)

## 2019-09-06 LAB — CK: Total CK: 55 U/L (ref 44–196)

## 2019-09-13 ENCOUNTER — Telehealth: Payer: Self-pay | Admitting: *Deleted

## 2019-09-13 ENCOUNTER — Telehealth: Payer: Self-pay | Admitting: Cardiology

## 2019-09-13 MED ORDER — METHYLPREDNISOLONE 4 MG PO TBPK: ORAL_TABLET | ORAL | 0 refills | Status: DC

## 2019-09-13 NOTE — Telephone Encounter (Signed)
Received call from patient.   Reports that he has been off of the statin x2 weeks. States that he has added magnesium for leg cramps, but it has not resolved his pain.   MD please advise.

## 2019-09-13 NOTE — Telephone Encounter (Signed)
  He has been evaluated by vascular already so blood flow not an issue He has also seen neurology already although it was for the "Spells" they did not mention any muscular disorder concerns  He can try orthopedics, but this may be a shot in the dark since he didn't have any true joint pain/back pain  After reviewing his note, possible nerve impingment, causing his symptoms, though not the classic sciatica symptoms   Refer to ortho- Dx bilat leg pain/ thigh pain

## 2019-09-13 NOTE — Telephone Encounter (Signed)
Has been experiencing pain in thighs and calves. Started about three weeks ago.    Wondered if it is a side affect from medication

## 2019-09-13 NOTE — Telephone Encounter (Signed)
Call placed to patient.   States that he would like to try and determine cause of pain instead of just treating it.   Inquired as to next steps to determining cause.

## 2019-09-13 NOTE — Telephone Encounter (Signed)
Pt c/o pain and inflammation in calves and upper thighs that happens when he is up and walking that has gotten worse over the last few weeks - has taken ASA has helped with the pain - stopped atorvastatin 2 weeks ago (was started by Dr Buelah Manis) and magnesium added and says this has not helped the pain - was started on Lopressor in February and wanted to know if this could be causing leg pain and aware that is not a normal side affect

## 2019-09-13 NOTE — Telephone Encounter (Signed)
Not a common side effect with lopressor, looks like Dr Buelah Manis has evaluated this and following, defer to her any further workup.   Zandra Abts MD

## 2019-09-13 NOTE — Telephone Encounter (Signed)
Next step is medrol dosepak, for inflammation causing the myalgia's , send to pharmacy

## 2019-09-14 NOTE — Telephone Encounter (Signed)
Call placed to patient and patient made aware.   States that he feels that ortho referral is not necessary as it is more a muscle issue than joint issue. Advised that presumptive dx is possible nerve impingement.   States that he is also going to resume Pravastatin as he does require statin tx for cholesterol. He is planning on taking QOD at this time to make sure pain does not get worse.   MD to be made aware.

## 2019-09-14 NOTE — Telephone Encounter (Signed)
Pt voiced understanding

## 2019-09-15 NOTE — Telephone Encounter (Signed)
Noted  

## 2019-10-12 ENCOUNTER — Encounter: Payer: Self-pay | Admitting: Cardiology

## 2019-10-12 ENCOUNTER — Other Ambulatory Visit: Payer: Self-pay

## 2019-10-12 ENCOUNTER — Ambulatory Visit (INDEPENDENT_AMBULATORY_CARE_PROVIDER_SITE_OTHER): Payer: Commercial Managed Care - PPO | Admitting: Cardiology

## 2019-10-12 VITALS — BP 118/68 | HR 55 | Ht 67.0 in | Wt 135.2 lb

## 2019-10-12 DIAGNOSIS — E782 Mixed hyperlipidemia: Secondary | ICD-10-CM | POA: Diagnosis not present

## 2019-10-12 DIAGNOSIS — I10 Essential (primary) hypertension: Secondary | ICD-10-CM

## 2019-10-12 DIAGNOSIS — R002 Palpitations: Secondary | ICD-10-CM | POA: Diagnosis not present

## 2019-10-12 MED ORDER — PRAVASTATIN SODIUM 20 MG PO TABS
20.0000 mg | ORAL_TABLET | ORAL | Status: DC
Start: 2019-10-12 — End: 2021-03-12

## 2019-10-12 MED ORDER — METOPROLOL TARTRATE 25 MG PO TABS: 25.0000 mg | ORAL_TABLET | Freq: Two times a day (BID) | ORAL | Status: DC

## 2019-10-12 NOTE — Patient Instructions (Signed)

## 2019-10-12 NOTE — Progress Notes (Signed)
Clinical Summary Mr. Wixson is a 64 y.o.male  seen today for follow up of the following medical problems   1. Fatigue/SOB/Palpitations - - symptoms have resolved - has increased his oral hydration and symptoms have improved. Dizziness has resolved - palpitations have resolved.  -03/2018 echo LVEF 65-70%, no WMAs, grade I diastolic dysfunction [id number] nuclear stress: no ischemia   03/2018 monitor: Min HR 85, Max HR 193, Avg HR 85 Available strips show SVT up to 10 beats, NSVT up to 7 beats.    - no recent symptoms    2. HTN - compliant with meds  3. Aortic atherosclerosis - noted by CT scann  4. Carotid stenosis - mild bilateral disease   5. Hyperlipidemia - takes pravastatin 40mg  three times a week. Just started Jan 4.  - rash on lipitor, just started on low dose pravastatin to see how tolerated  - recent muslce aches. CK checked by pcp was normal - leg pains not better off statin         SH: he completed covid vaccines. Past Medical History:  Diagnosis Date  . BPH (benign prostatic hypertrophy)   . 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 Ear infection 08/12/2016   pt believes he may be coming down with an ear infection today  . Hemorrhoids   . Hyperlipidemia   . Hypertension   . LVH (left ventricular hypertrophy)    LVH variant, EKG show ST elevation, normal cardiac catherization 2009  . Prostatitis      Allergies  Allergen Reactions  . Oxycodone     " makes me sick with nausea "  . Lipitor [Atorvastatin] Rash and Other (See Comments)    "Bumps showed up on skin "     Current Outpatient Medications  Medication Sig Dispense Refill  . amLODipine (NORVASC) 10 MG tablet TAKE 1 TABLET BY MOUTH  DAILY 90 tablet 3  . esomeprazole (NEXIUM) 20 MG capsule Take 20 mg by mouth daily at 12 noon.    . fenofibrate (TRICOR) 48 MG tablet TAKE 1 TABLET BY MOUTH  DAILY 90 tablet 3  . fish oil-omega-3 fatty acids 1000 MG capsule  Take 2 g by mouth daily.     . Menthol-Methyl Salicylate (MUSCLE RUB) 10-15 % CREA Apply 1 application topically as needed for muscle pain.    Marland Kitchen OVER THE COUNTER MEDICATION See admin instructions. Genius brand Digestion Opitimizer. Take 3-4 capsules daily.    . pantoprazole (PROTONIX) 40 MG tablet TAKE 1 TABLET BY MOUTH  DAILY 90 tablet 3  . Probiotic Product (ACIDOPHILUS/BIFIDUS PO) Take 1 capsule by mouth daily.    . tamsulosin (FLOMAX) 0.4 MG CAPS capsule TAKE 1 CAPSULE BY MOUTH  DAILY 90 capsule 3  . TURMERIC PO Take 1,000 mg by mouth daily.     . metoprolol tartrate (LOPRESSOR) 25 MG tablet Take 1 tablet (25 mg total) by mouth 3 (three) times daily. 270 tablet 3   No current facility-administered medications for this visit.     Past Surgical History:  Procedure Laterality Date  . ANTERIOR CERVICAL DECOMP/DISCECTOMY FUSION N/A 08/12/2016   Procedure: C5-6, C6-7 ANTERIOR CERVICAL DECOMPRESSION/DISCECTOMY FUSION 2 LEVELS, ALLOGRAFT, PLATE;  Surgeon: Marybelle Killings, MD;  Location: [facility];  Service: Orthopedics;  Laterality: N/A;  . CARDIAC CATHETERIZATION    . CERVICAL DISC SURGERY  08/13/2015   c 5 c6 c7  . COLONOSCOPY    . COLONOSCOPY N/A 10/02/2016  Procedure: COLONOSCOPY;  Surgeon: Rogene Houston, MD;  [facility];  Service: Endoscopy;  Laterality: N/A;  12:45  . dental inplants    . HEMORRHOID SURGERY    . POLYPECTOMY  10/02/2016   Procedure: POLYPECTOMY;  Surgeon: Rogene Houston, MD;  Location: [facility];  Service: Endoscopy;;  colon  . UPPER GASTROINTESTINAL ENDOSCOPY       Allergies  Allergen Reactions  . Oxycodone     " makes me sick with nausea "  . Lipitor [Atorvastatin] Rash and Other (See Comments)    "Bumps showed up on skin "      Family History  Problem Relation Age of Onset  . Stroke Mother   . Hypertension Father   . Diabetes Father   . Prostate cancer Father   . Diabetes Sister   . Diabetes Sister      Social History Mr. Holdaway  reports that he has quit smoking. His smoking use included cigarettes. He has a 15.00 pack-year smoking history. He has quit using smokeless tobacco. Mr. Kolin reports current alcohol use.   Review of Systems CONSTITUTIONAL: No weight loss, fever, chills, weakness or fatigue.  HEENT: Eyes: No visual loss, blurred vision, double vision or yellow sclerae.No hearing loss, sneezing, congestion, runny nose or sore throat.  SKIN: No rash or itching.  CARDIOVASCULAR: per hpi RESPIRATORY: No shortness of breath, cough or sputum.  GASTROINTESTINAL: No anorexia, nausea, vomiting or diarrhea. No abdominal pain or blood.  GENITOURINARY: No burning on urination, no polyuria NEUROLOGICAL: No headache, dizziness, syncope, paralysis, ataxia, numbness or tingling in the extremities. No change in bowel or bladder control.  MUSCULOSKELETAL: No muscle, back pain, joint pain or stiffness.  LYMPHATICS: No enlarged nodes. No history of splenectomy.  PSYCHIATRIC: No history of depression or anxiety.  ENDOCRINOLOGIC: No reports of sweating, cold or heat intolerance. No polyuria or polydipsia.  Marland Kitchen   Physical Examination Vitals:   10/12/19 1613  BP: 118/68  Pulse: (!) 55  SpO2: 98%   Filed Weights   10/12/19 1613  Weight: 135 lb 3.2 oz (61.3 kg)    Gen: resting comfortably, no acute distress HEENT: no scleral icterus, pupils equal round and reactive, no palptable cervical adenopathy,  CV: RRR, no m/r/g, no jvd Resp: Clear to auscultation bilaterally GI: abdomen is soft, non-tender, non-distended, normal bowel sounds, no hepatosplenomegaly MSK: extremities are warm, no edema.  Skin: warm, no rash Neuro:  no focal deficits Psych: appropriate affect   Diagnostic Studies 03/2018 echo Study Conclusions  - Left ventricle: The cavity size was normal. Wall thickness was increased in a pattern of moderate LVH. Systolic function was vigorous. The estimated ejection fraction was in the range of  65% to 70%. Wall motion was normal; there were no regional wall motion abnormalities. Doppler parameters are consistent with abnormal left ventricular relaxation (grade 1 diastolic dysfunction). Doppler parameters are consistent with indeterminate ventricular filling pressure. - Mitral valve: There was mild regurgitation. - Tricuspid valve: There was mild regurgitation.  03/2018 nuclear stress  Blood pressure demonstrated a hypertensive response to exercise.  LVH with diffuse repolarization abnormalities seen throughout study.  The study is normal. No evidence of myocardial ischemia or scar.  This is a low risk study.  Nuclear stress EF: 53%.    Assessment and Plan  1. Palpitations/SOB - monitor shows some NSVT and PSVT - echo and nuclear stress do not show any significant underlying heart disease -no recent symptoms, cotinue beta blocker   2.  HTN - at goal, conitnue current meds   3. Hyperlipidemia - rash on atorvastatin - symptoms on daily pravastatin, trying every other day dosing. Monitor at this time.       Arnoldo Lenis, M.D.

## 2019-10-25 ENCOUNTER — Ambulatory Visit (INDEPENDENT_AMBULATORY_CARE_PROVIDER_SITE_OTHER): Payer: Commercial Managed Care - PPO | Admitting: Family Medicine

## 2019-10-25 ENCOUNTER — Encounter: Payer: Self-pay | Admitting: Family Medicine

## 2019-10-25 ENCOUNTER — Other Ambulatory Visit: Payer: Self-pay

## 2019-10-25 VITALS — BP 138/78 | HR 66 | Temp 98.7°F | Resp 14 | Ht 67.0 in | Wt 135.0 lb

## 2019-10-25 DIAGNOSIS — G8929 Other chronic pain: Secondary | ICD-10-CM | POA: Diagnosis not present

## 2019-10-25 DIAGNOSIS — M545 Low back pain, unspecified: Secondary | ICD-10-CM

## 2019-10-25 DIAGNOSIS — Z125 Encounter for screening for malignant neoplasm of prostate: Secondary | ICD-10-CM

## 2019-10-25 DIAGNOSIS — M5126 Other intervertebral disc displacement, lumbar region: Secondary | ICD-10-CM

## 2019-10-25 DIAGNOSIS — M51369 Other intervertebral disc degeneration, lumbar region without mention of lumbar back pain or lower extremity pain: Secondary | ICD-10-CM | POA: Insufficient documentation

## 2019-10-25 DIAGNOSIS — M5136 Other intervertebral disc degeneration, lumbar region: Secondary | ICD-10-CM

## 2019-10-25 MED ORDER — METHOCARBAMOL 500 MG PO TABS: 500.0000 mg | ORAL_TABLET | Freq: Three times a day (TID) | ORAL | 0 refills | Status: DC | PRN

## 2019-10-25 NOTE — Progress Notes (Signed)
Subjective:    Patient ID: Kyle Rivers, male    DOB: Jul 18, 1955, 64 y.o.   MRN: [id number]  Patient presents for L Sided Pain (x months)   Pt here with  Chronic  Left sided low back pain  he is able to do his activiyies   he has used topical anti-inflammatory actually helped his pain.  Is more of a nagging pain and soreness in his left side.  He does not have any current GI symptoms no urinary symptoms no change in his urinary pattern.  He does not have any radiating symptoms down his legs.  His previous leg pain is actually resolved.  The headaches and intermittent paresthesias symptoms he was having have also resolved.  He did take some type of probiotic/mineral supplement called restore which helped.  He is able to do all his regular activities without any difficulty.  On review of his previous chart back in 2010 he had an MRI of the lumbar spine IMPRESSION:    1. Diffuse annular bulge at L4-5. This in combination with facet  disease creates early spinal and lateral recess stenosis.  2. 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 Mild foraminal stenosis on the  left and moderate foraminal stenosis on the right.     Review Of Systems:  GEN- denies fatigue, fever, weight loss,weakness, recent illness HEENT- denies eye drainage, change in vision, nasal discharge, CVS- denies chest pain, palpitations RESP- denies SOB, cough, wheeze ABD- denies N/V, change in stools, abd pain GU- denies dysuria, hematuria, dribbling, incontinence MSK- + joint pain, muscle aches, injury Neuro- denies headache, dizziness, syncope, seizure activity       Objective:    BP 138/78   Pulse 66   Temp 98.7 F (37.1 C) (Temporal)   Resp 14   Ht 5\' 7"  (1.702 m)   Wt 135 lb (61.2 kg)   SpO2 96%   BMI 21.14 kg/m  GEN- NAD, alert and oriented x3 CVS- RRR, no murmur RESP-CTAB ABD-NABS,soft,NT,ND, no CVA tenderness  MSK- Spine NT, TTP left  paraspinals, neg SLR, fair ROM spine Neuro-normal sensation LE, strength in tact, non antalgic gait EXT- No edema Pulses- Radial  2+        Assessment & Plan:      Problem List Items Addressed This Visit      Unprioritized   Bulge of lumbar disc without myelopathy   LOW BACK PAIN - Primary    I think his chronic pain on the left side is not due to any GI issue at this time or truly any urinary symptoms or sign of infection.  His CT scan from the [facility] did not show any kidney stones.  He does have history of bulging disc with some arthritis which is noted 10 years ago and this is likely progressed.  At this point he is able to control the pain with exercises he can use topical rubs.Robaxin given for muscle spasm/pain   I will hold off on any repeat imaging at this time.  No red flags on exam . He was concerned about cancer he has not had a PSA and at his age this is recommended so we will go ahead and get this today.  If he is able to leave a urinalysis we will also check that too        Relevant Medications   methocarbamol (ROBAXIN) 500 MG tablet   Other Relevant Orders   Basic metabolic  panel   Urinalysis, Routine w reflex microscopic    Other Visit Diagnoses    Prostate cancer screening       Relevant Orders   PSA      Note: This dictation was prepared with Dragon dictation along with smaller phrase technology. Any transcriptional errors that result from this process are unintentional.

## 2019-10-25 NOTE — Patient Instructions (Addendum)
Try the robaxin We will call with lab results  F/U 4 months for PHYSICAL

## 2019-10-25 NOTE — Assessment & Plan Note (Signed)
I think his chronic pain on the left side is not due to any GI issue at this time or truly any urinary symptoms or sign of infection.  His CT scan from the [facility] did not show any kidney stones.  He does have history of bulging disc with some arthritis which is noted 10 years ago and this is likely progressed.  At this point he is able to control the pain with exercises he can use topical rubs.Robaxin given for muscle spasm/pain   I will hold off on any repeat imaging at this time.  No red flags on exam . He was concerned about cancer he has not had a PSA and at his age this is recommended so we will go ahead and get this today.  If he is able to leave a urinalysis we will also check that too

## 2019-10-26 ENCOUNTER — Other Ambulatory Visit: Payer: Commercial Managed Care - PPO

## 2019-10-26 LAB — URINALYSIS, ROUTINE W REFLEX MICROSCOPIC
Bilirubin Urine: NEGATIVE
Glucose, UA: NEGATIVE
Hgb urine dipstick: NEGATIVE
Ketones, ur: NEGATIVE
Leukocytes,Ua: NEGATIVE
Nitrite: NEGATIVE
Protein, ur: NEGATIVE
Specific Gravity, Urine: 1.02 (ref 1.001–1.03)
pH: 5.5 (ref 5.0–8.0)

## 2019-10-26 LAB — BASIC METABOLIC PANEL
BUN: 14 mg/dL (ref 7–25)
CO2: 25 mmol/L (ref 20–32)
Calcium: 9.9 mg/dL (ref 8.6–10.3)
Chloride: 103 mmol/L (ref 98–110)
Creat: 1.24 mg/dL (ref 0.70–1.25)
Glucose, Bld: 84 mg/dL (ref 65–99)
Potassium: 4.1 mmol/L (ref 3.5–5.3)
Sodium: 139 mmol/L (ref 135–146)

## 2019-10-26 LAB — PSA: PSA: 2 ng/mL (ref <=4.0)

## 2019-11-02 ENCOUNTER — Encounter: Payer: Self-pay | Admitting: *Deleted

## 2020-03-01 ENCOUNTER — Other Ambulatory Visit: Payer: Self-pay | Admitting: Family Medicine

## 2020-06-10 ENCOUNTER — Telehealth: Payer: Self-pay | Admitting: Family Medicine

## 2020-06-10 NOTE — Telephone Encounter (Signed)
Noted  

## 2020-06-10 NOTE — Telephone Encounter (Signed)
Call placed to patient.   Reports that he was having L sided pain and wanted to schedule OV with PCP. Reports that he has called every day and left messages for appointment, but has not received return call. Reports that he will be changing to another doctor, especially since PCP is also leaving office.   Reports that he has been seen at [facility] since he could not schedule here. States that he would like to F/U from UC before changing providers.   Appointment scheduled.

## 2020-06-10 NOTE — Telephone Encounter (Signed)
Pt came in the office wanting to know what to do about getting another dr. Roberts Gaudy to speak with nurse or Dr. Buelah Manis   Cb#: [phone]

## 2020-06-17 ENCOUNTER — Encounter: Payer: Self-pay | Admitting: Family Medicine

## 2020-06-17 ENCOUNTER — Ambulatory Visit (INDEPENDENT_AMBULATORY_CARE_PROVIDER_SITE_OTHER): Payer: Commercial Managed Care - PPO | Admitting: Family Medicine

## 2020-06-17 ENCOUNTER — Other Ambulatory Visit: Payer: Self-pay

## 2020-06-17 VITALS — BP 134/78 | HR 70 | Temp 97.9°F | Resp 14 | Ht 67.0 in | Wt 137.0 lb

## 2020-06-17 DIAGNOSIS — I700 Atherosclerosis of aorta: Secondary | ICD-10-CM | POA: Diagnosis not present

## 2020-06-17 DIAGNOSIS — I10 Essential (primary) hypertension: Secondary | ICD-10-CM

## 2020-06-17 DIAGNOSIS — R0781 Pleurodynia: Secondary | ICD-10-CM

## 2020-06-17 DIAGNOSIS — N400 Enlarged prostate without lower urinary tract symptoms: Secondary | ICD-10-CM

## 2020-06-17 DIAGNOSIS — E782 Mixed hyperlipidemia: Secondary | ICD-10-CM | POA: Diagnosis not present

## 2020-06-17 DIAGNOSIS — I779 Disorder of arteries and arterioles, unspecified: Secondary | ICD-10-CM | POA: Diagnosis not present

## 2020-06-17 DIAGNOSIS — M545 Low back pain, unspecified: Secondary | ICD-10-CM

## 2020-06-17 DIAGNOSIS — G8929 Other chronic pain: Secondary | ICD-10-CM

## 2020-06-17 NOTE — Patient Instructions (Addendum)
Kyle Rivers

## 2020-06-17 NOTE — Assessment & Plan Note (Signed)
Controlled no changes 

## 2020-06-17 NOTE — Assessment & Plan Note (Signed)
Suspect degrees of DDD as he has in his neck Obtain xray > 6 months of intermittent pain Rib pain likely referred from paraspinals, but he does have a spot right on the bone , so I will xray this as well  Continue topicals

## 2020-06-17 NOTE — Assessment & Plan Note (Signed)
Follows with vascular and cardiology On low dose statin Will repeat labs with new PCP

## 2020-06-17 NOTE — Assessment & Plan Note (Signed)
Maintained on flomax PSA normal within the past year

## 2020-06-17 NOTE — Progress Notes (Signed)
Subjective:    Patient ID: Kyle Rivers, male    DOB: 1955/07/15, 65 y.o.   MRN: [id number]  Patient presents for Follow-up (L sided pain near back- no changes to BM/Urine) Patient here to follow-up chronic medical problems.  Last visit in June 2021.  Occasions reviewed.  Hypertension he is taking his blood pressure medicine as prescribed.  He also has underlying aortic atherosclerosis and carotid artery disease.  He is on pravastatin 20 mg every other day as well as TriCor and omega-3 fish oil.  Tolerate high-dose statins. He has minimal carotid artery stenosis on his ultrasound in January 2021 BPH he takes Flomax as well as supplements for his prostate.  GERD he takes Protonix as needed   PT here with intermittant episodes of Left sided back pain for months He was seen by UC couple weeks ago   Subjective:    Patient ID: Kyle Rivers, male    DOB: 1955-10-22, 65 y.o.   MRN: [id number]  Patient presents for Follow-up (L sided pain near back- no changes to BM/Urine)   Pt here with  Chronic  Left sided low back pain  he is able to do his activiyies   he has used topical anti-inflammatory actually helped his pain.  Is more of a nagging pain and soreness in his left side.  He does not have any current GI symptoms no urinary symptoms no change in his urinary pattern.  He does not have any radiating symptoms down his legs.  His previous leg pain is actually resolved.  The headaches and intermittent paresthesias symptoms he was having have also resolved.  He did take some type of probiotic/mineral supplement called restore which helped.  He is able to do all his regular activities without any difficulty.  On review of his previous chart back in 2010 he had an MRI of the lumbar spine IMPRESSION:    1. Diffuse annular bulge at L4-5. This in combination with facet  disease creates early spinal and lateral recess stenosis.  2. Advanced disc disease at L5-S1 with a bulging degenerated   annulus and osteophytic spurring. Mild foraminal stenosis on the  left and moderate foraminal stenosis on the right.     Review Of Systems:  GEN- denies fatigue, fever, weight loss,weakness, recent illness HEENT- denies eye drainage, change in vision, nasal discharge, CVS- denies chest pain, palpitations RESP- denies SOB, cough, wheeze ABD- denies N/V, change in stools, abd pain GU- denies dysuria, hematuria, dribbling, incontinence MSK- + joint pain, muscle aches, injury Neuro- denies headache, dizziness, syncope, seizure activity       Objective:    BP 134/78   Pulse 70   Temp 97.9 F (36.6 C) (Temporal)   Resp 14   Ht 5\' 7"  (1.702 m)   Wt 137 lb (62.1 kg)   SpO2 99%   BMI 21.46 kg/m  GEN- NAD, alert and oriented x3 CVS- RRR, no murmur RESP-CTAB ABD-NABS,soft,NT,ND, no CVA tenderness  MSK- Spine NT, TTP left paraspinals, neg SLR, fair ROM spine Neuro-normal sensation LE, strength in tact, non antalgic gait EXT- No edema Pulses- Radial  2+        Assessment & Plan:      Problem List Items Addressed This Visit      Unprioritized   Aortic atherosclerosis ([place])    Follows with vascular and cardiology On low dose statin Will repeat labs with new PCP       Benign prostatic hyperplasia    Maintained  on flomax PSA normal within the past year      Carotid artery disease ([place])   Essential hypertension - Primary    Controlled no changes       Hyperlipidemia   LOW BACK PAIN    Suspect degrees of DDD as he has in his neck Obtain xray > 6 months of intermittent pain Rib pain likely referred from paraspinals, but he does have a spot right on the bone , so I will xray this as well  Continue topicals      Relevant Orders   DG Lumbar Spine Complete    Other Visit Diagnoses    Rib pain on left side       Relevant Orders   DG Ribs Unilateral Left      Note: This dictation was prepared with Dragon dictation along with smaller phrase technology. Any  transcriptional errors that result from this process are unintentional.   , told topical use for th epain  No difficulty urinating  no bowels changes  Initially couldn't lay on the side  Complaining of left-sided back pain back in June as well.  Has not had any imaging on this.  No radicular symptoms.  He has also had pain near the base of his ribs on the left side no injury no cough, no rash  [facility] Dr. Melford Aase will be his new PCP   Review Of Systems:  GEN- denies fatigue, fever, weight loss,weakness, recent illness HEENT- denies eye drainage, change in vision, nasal discharge, CVS- denies chest pain, palpitations RESP- denies SOB, cough, wheeze ABD- denies N/V, change in stools, abd pain GU- denies dysuria, hematuria, dribbling, incontinence MSK-+ joint pain, muscle aches, injury Neuro- denies headache, dizziness, syncope, seizure activity       Objective:    BP 134/78   Pulse 70   Temp 97.9 F (36.6 C) (Temporal)   Resp 14   Ht 5\' 7"  (1.702 m)   Wt 137 lb (62.1 kg)   SpO2 99%   BMI 21.46 kg/m  GEN- NAD, alert and oriented x3 HEENT- PERRL, EOMI, non injected sclera, pink conjunctiva, CVS- RRR, no murmur RESP-CTAB ABD-NABS,soft,NT,ND MSK- Spine NT, TTP left paraspinals, neg SLR, fair ROM spine TTP base of left rib, gross deformity Skin in tact  Neuro-normal sensation LE, strength in tact, non antalgic gait EXT- No edema Pulses- Radial  2+        Assessment & Plan:    Pt given some old records to take to new PCP visit    Problem List Items Addressed This Visit      Unprioritized   Aortic atherosclerosis ([place])    Follows with vascular and cardiology On low dose statin Will repeat labs with new PCP       Benign prostatic hyperplasia    Maintained on flomax PSA normal within the past year      Carotid artery disease ([place])   Essential hypertension - Primary    Controlled no changes       Hyperlipidemia   LOW BACK PAIN    Suspect degrees of DDD as  he has in his neck Obtain xray > 6 months of intermittent pain Rib pain likely referred from paraspinals, but he does have a spot right on the bone , so I will xray this as well  Continue topicals      Relevant Orders   DG Lumbar Spine Complete    Other Visit Diagnoses    Rib pain on left side  Relevant Orders   DG Ribs Unilateral Left      Note: This dictation was prepared with Dragon dictation along with smaller phrase technology. Any transcriptional errors that result from this process are unintentional.

## 2020-06-18 ENCOUNTER — Ambulatory Visit (HOSPITAL_COMMUNITY)
Admission: RE | Admit: 2020-06-18 | Discharge: 2020-06-18 | Disposition: A | Discharge status: Home / Self Care | Payer: Commercial Managed Care - PPO | Source: Ambulatory Visit | Attending: Family Medicine | Admitting: Family Medicine

## 2020-06-18 DIAGNOSIS — G8929 Other chronic pain: Secondary | ICD-10-CM | POA: Diagnosis present

## 2020-06-18 DIAGNOSIS — M545 Low back pain, unspecified: Secondary | ICD-10-CM | POA: Diagnosis not present

## 2020-06-18 DIAGNOSIS — R0781 Pleurodynia: Secondary | ICD-10-CM | POA: Insufficient documentation

## 2020-06-19 ENCOUNTER — Other Ambulatory Visit: Payer: Self-pay | Admitting: *Deleted

## 2020-06-19 DIAGNOSIS — M4310 Spondylolisthesis, site unspecified: Secondary | ICD-10-CM

## 2020-06-19 DIAGNOSIS — M5137 Other intervertebral disc degeneration, lumbosacral region: Secondary | ICD-10-CM

## 2020-06-24 NOTE — Telephone Encounter (Signed)
I have reviewed the call logs at the front and I don't see where patient has left any vm. I am not sure where patient left vm's. I also see that patient has been seen since this note.

## 2020-07-15 ENCOUNTER — Ambulatory Visit: Payer: Commercial Managed Care - PPO | Admitting: Orthopedic Surgery

## 2020-07-15 NOTE — Progress Notes (Deleted)
2010 mri  IMPRESSION:    1. Diffuse annular bulge at L4-5. This in combination with facet  disease creates early spinal and lateral recess stenosis.  2. 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 Mild foraminal stenosis on the  left and moderate foraminal stenosis on the right.   Provider: Juanda Chance  xrays 2022 IMPRESSION:    1. Diffuse annular bulge at L4-5.  This in combination with facet  disease creates early spinal and lateral recess stenosis.  2.  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  Mild foraminal stenosis on the  left and moderate foraminal stenosis on the right.   Provider: Juanda Chance

## 2020-12-13 ENCOUNTER — Other Ambulatory Visit (HOSPITAL_COMMUNITY)
Admission: RE | Admit: 2020-12-13 | Discharge: 2020-12-13 | Disposition: A | Discharge status: Home / Self Care | Payer: Commercial Managed Care - PPO | Source: Ambulatory Visit | Attending: Family Medicine | Admitting: Family Medicine

## 2020-12-13 DIAGNOSIS — I10 Essential (primary) hypertension: Secondary | ICD-10-CM | POA: Insufficient documentation

## 2020-12-13 DIAGNOSIS — E782 Mixed hyperlipidemia: Secondary | ICD-10-CM | POA: Diagnosis not present

## 2020-12-13 LAB — COMPREHENSIVE METABOLIC PANEL
ALT: 17 U/L (ref 0–44)
AST: 20 U/L (ref 15–41)
Albumin: 3.8 g/dL (ref 3.5–5.0)
Alkaline Phosphatase: 79 U/L (ref 38–126)
Anion gap: 7 (ref 5–15)
BUN: 12 mg/dL (ref 8–23)
CO2: 27 mmol/L (ref 22–32)
Calcium: 9 mg/dL (ref 8.9–10.3)
Chloride: 103 mmol/L (ref 98–111)
Creatinine, Ser: 1.11 mg/dL (ref 0.61–1.24)
GFR, Estimated: >60 mL/min (ref >60)
Glucose, Bld: 141 mg/dL — ABNORMAL HIGH (ref 70–99)
Potassium: 3.8 mmol/L (ref 3.5–5.1)
Sodium: 137 mmol/L (ref 135–145)
Total Bilirubin: 0.6 mg/dL (ref 0.3–1.2)
Total Protein: 7.8 g/dL (ref 6.5–8.1)

## 2020-12-13 LAB — LIPID PANEL
Cholesterol: 188 mg/dL (ref 0–200)
HDL: 63 mg/dL (ref >40)
LDL Cholesterol: 105 mg/dL — ABNORMAL HIGH (ref 0–99)
Total CHOL/HDL Ratio: 3 RATIO
Triglycerides: 102 mg/dL (ref <150)
VLDL: 20 mg/dL (ref 0–40)

## 2021-03-12 ENCOUNTER — Ambulatory Visit (INDEPENDENT_AMBULATORY_CARE_PROVIDER_SITE_OTHER): Payer: Commercial Managed Care - PPO | Admitting: Cardiology

## 2021-03-12 ENCOUNTER — Encounter: Payer: Self-pay | Admitting: Cardiology

## 2021-03-12 VITALS — BP 120/80 | HR 68 | Ht 67.0 in | Wt 128.8 lb

## 2021-03-12 DIAGNOSIS — R002 Palpitations: Secondary | ICD-10-CM

## 2021-03-12 DIAGNOSIS — E782 Mixed hyperlipidemia: Secondary | ICD-10-CM | POA: Diagnosis not present

## 2021-03-12 DIAGNOSIS — I10 Essential (primary) hypertension: Secondary | ICD-10-CM

## 2021-03-12 MED ORDER — METOPROLOL TARTRATE 25 MG PO TABS: 12.5000 mg | ORAL_TABLET | Freq: Two times a day (BID) | ORAL | Status: DC

## 2021-03-12 NOTE — Progress Notes (Signed)
Clinical Summary Kyle Rivers is a 65 y.o.male seen today for follow up of the following medical problems    1. Fatigue/SOB/Palpitations - - symptoms have resolved - has increased his oral hydration and symptoms have improved. Dizziness has resolved - palpitations have resolved.  -03/2018 echo LVEF 65-70%, no WMAs, grade I diastolic dysfunction 60/6301 nuclear stress: no ischemia     03/2018 monitor: Min HR 85, Max HR 193, Avg HR 85 Available strips show SVT up to 10 beats, NSVT up to 7 beats.      - no recent palpitations - compliant with meds. Taking lopressor once a day       2. HTN - he is compliant with meds - history of LVH, reports history HTN since age 47   3. Aortic atherosclerosis - noted by CT scann   4. Carotid stenosis - mild bilateral disease 2021 [place]     5. Hyperlipidemia - rash on lipitor, has been on pravastatin. Pcp recently increased to 80mg  daily.       Past Medical History:  Diagnosis Date   BPH (benign prostatic hypertrophy)    DJD (degenerative joint disease)    in lower back    Ear infection 08/12/2016   pt believes he may be coming down with an ear infection today   Hemorrhoids    Hyperlipidemia    Hypertension    LVH (left ventricular hypertrophy)    LVH variant, EKG show ST elevation, normal cardiac catherization 2009   Prostatitis      Allergies  Allergen Reactions   Oxycodone     " makes me sick with nausea "   Lipitor [Atorvastatin] Rash and Other (See Comments)    "Bumps showed up on skin "     Current Outpatient Medications  Medication Sig Dispense Refill   amLODipine (NORVASC) 10 MG tablet TAKE 1 TABLET BY MOUTH  DAILY 90 tablet 3   esomeprazole (NEXIUM) 20 MG capsule Take 20 mg by mouth daily at 12 noon.     fenofibrate (TRICOR) 48 MG tablet TAKE 1 TABLET BY MOUTH  DAILY 90 tablet 3   fish oil-omega-3 fatty acids 1000 MG capsule Take 2 g by mouth daily.      Menthol-Methyl Salicylate (MUSCLE RUB) 10-15 % CREA  Apply 1 application topically as needed for muscle pain.     methocarbamol (ROBAXIN) 500 MG tablet Take 1 tablet (500 mg total) by mouth every 8 (eight) hours as needed for muscle spasms. 20 tablet 0   metoprolol tartrate (LOPRESSOR) 25 MG tablet Take 1 tablet (25 mg total) by mouth 2 (two) times daily.     OVER THE COUNTER MEDICATION See admin instructions. Genius brand Digestion Opitimizer. Take 3-4 capsules daily.     pantoprazole (PROTONIX) 40 MG tablet TAKE 1 TABLET BY MOUTH  DAILY 90 tablet 3   pravastatin (PRAVACHOL) 20 MG tablet Take 1 tablet (20 mg total) by mouth every other day.     Probiotic Product (ACIDOPHILUS/BIFIDUS PO) Take 1 capsule by mouth daily.     tamsulosin (FLOMAX) 0.4 MG CAPS capsule TAKE 1 CAPSULE BY MOUTH  DAILY 90 capsule 3   TURMERIC PO Take 1,000 mg by mouth daily.      No current facility-administered medications for this visit.     Past Surgical History:  Procedure Laterality Date   ANTERIOR CERVICAL DECOMP/DISCECTOMY FUSION N/A 08/12/2016   Procedure: C5-6, C6-7 ANTERIOR CERVICAL DECOMPRESSION/DISCECTOMY FUSION 2 LEVELS, ALLOGRAFT, PLATE;  Surgeon: Marybelle Killings,  MD;  Location: [place];  Service: Orthopedics;  Laterality: N/A;   CARDIAC CATHETERIZATION     CERVICAL [place] SURGERY  08/13/2015   c 5 c6 c7   COLONOSCOPY     COLONOSCOPY N/A 10/02/2016   Procedure: COLONOSCOPY;  Surgeon: Rogene Houston, MD;  [facility];  Service: Endoscopy;  Laterality: N/A;  12:45   dental inplants     HEMORRHOID SURGERY     POLYPECTOMY  10/02/2016   Procedure: POLYPECTOMY;  Surgeon: Rogene Houston, MD;  [facility];  Service: Endoscopy;;  colon   UPPER GASTROINTESTINAL ENDOSCOPY       Allergies  Allergen Reactions   Oxycodone     " makes me sick with nausea "   Lipitor [Atorvastatin] Rash and Other (See Comments)    "Bumps showed up on skin "      Family History  Problem Relation Age of Onset   Stroke Mother    Hypertension Father     Diabetes Father    Prostate cancer Father    Diabetes Sister    Diabetes Sister      Social History Kyle Rivers reports that he has quit smoking. His smoking use included cigarettes. He has a 15.00 pack-year smoking history. He has quit using smokeless tobacco. Kyle Rivers reports current alcohol use.   Review of Systems CONSTITUTIONAL: No weight loss, fever, chills, weakness or fatigue.  HEENT: Eyes: No visual loss, blurred vision, double vision or yellow sclerae.No hearing loss, sneezing, congestion, runny nose or sore throat.  SKIN: No rash or itching.  CARDIOVASCULAR: per hi RESPIRATORY: No shortness of breath, cough or sputum.  GASTROINTESTINAL: No anorexia, nausea, vomiting or diarrhea. No abdominal pain or blood.  GENITOURINARY: No burning on urination, no polyuria NEUROLOGICAL: No headache, dizziness, syncope, paralysis, ataxia, numbness or tingling in the extremities. No change in bowel or bladder control.  MUSCULOSKELETAL: No muscle, back pain, joint pain or stiffness.  LYMPHATICS: No enlarged nodes. No history of splenectomy.  PSYCHIATRIC: No history of depression or anxiety.  ENDOCRINOLOGIC: No reports of sweating, cold or heat intolerance. No polyuria or polydipsia.  Marland Kitchen   Physical Examination Today's Vitals   03/12/21 1343  BP: 120/80  Pulse: 68  SpO2: 98%  Weight: 128 lb 12.8 oz (58.4 kg)  Height: 5\' 7"  (1.702 m)   Body mass index is 20.17 kg/m.  Gen: resting comfortably, no acute distress HEENT: no scleral icterus, pupils equal round and reactive, no palptable cervical adenopathy,  CV: RRR, no m/rg no jvd Resp: Clear to auscultation bilaterally GI: abdomen is soft, non-tender, non-distended, normal bowel sounds, no hepatosplenomegaly MSK: extremities are warm, no edema.  Skin: warm, no rash Neuro:  no focal deficits Psych: appropriate affect   Diagnostic Studies  03/2018 echo Study Conclusions   - Left ventricle: The cavity size was normal. Wall  thickness was   increased in a pattern of moderate LVH. Systolic function was   vigorous. The estimated ejection fraction was in the range of 65%   to 70%. Wall motion was normal; there were no regional wall   motion abnormalities. Doppler parameters are consistent with   abnormal left ventricular relaxation (grade 1 diastolic   dysfunction). Doppler parameters are consistent with   indeterminate ventricular filling pressure. - Mitral valve: There was mild regurgitation. - Tricuspid valve: There was mild regurgitation.   03/2018 nuclear stress Blood pressure demonstrated a hypertensive response to exercise. LVH with diffuse repolarization abnormalities seen throughout study. The  study is normal. No evidence of myocardial ischemia or scar. This is a low risk study. Nuclear stress EF: 53%.     Assessment and Plan   1. Palpitations/SOB - monitor shows some NSVT and PSVT - echo and nuclear stress do not show any significant underlying heart disease -doing well on lopressor. He reports low bp's on 25mg  bid, has been taking 25mg  just once daily. We will change lopressor to 12.5mg  bid.  EKG shows SR, chronic ST/T changes     2. HTN - he is at goal,continue current meds     3. Hyperlipidemia - rash on atorvastatin - tolerating pravastatin and pcp just increased dose, continue to monotir  F/u 1 year     Arnoldo Lenis, M.D

## 2021-03-12 NOTE — Patient Instructions (Addendum)
Medication Instructions:  Decrease your Lopressor to 12.5mg  twice a day.   Continue all other medications.     Labwork: none  Testing/Procedures: none  Follow-Up:  Your physician wants you to follow up in:  1 year.  You will receive a reminder letter in the mail one-two months in advance.  If you don't receive a letter, please call our office to schedule the follow up appointment    Any Other Special Instructions Will Be Listed Below (If Applicable).   If you need a refill on your cardiac medications before your next appointment, please call your pharmacy.

## 2021-06-16 IMAGING — DX DG RIBS 2V*L*
4 series · 4 of 4 positions shown · non-contrast
Comparison: None.

CLINICAL DATA: Left rib pain

EXAM:
LEFT RIBS - 2 VIEW

[rib pa (1 of 2)]
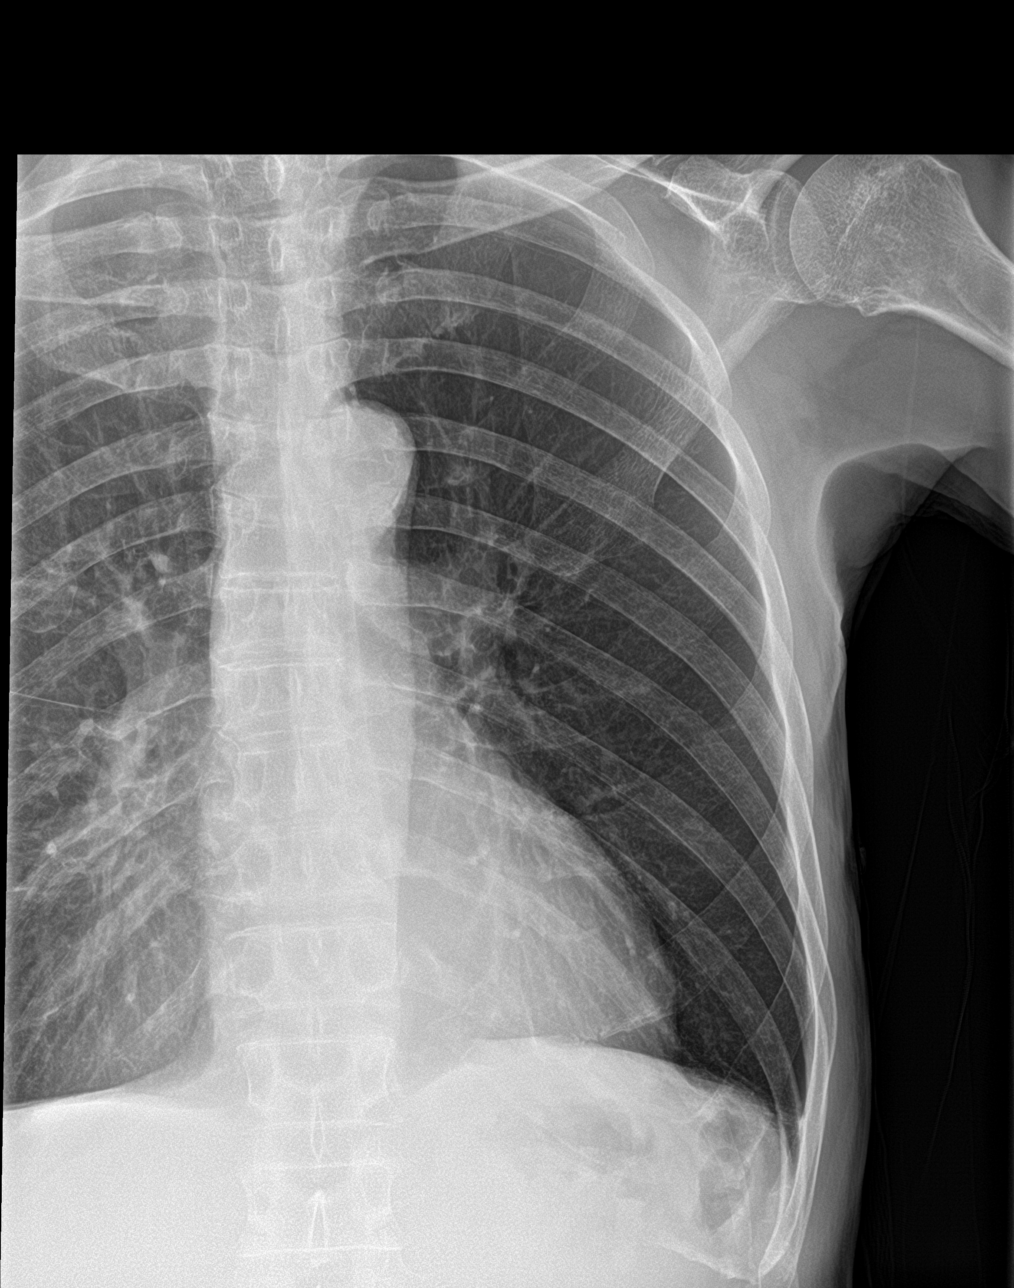

[rib pa obl (1 of 2)]
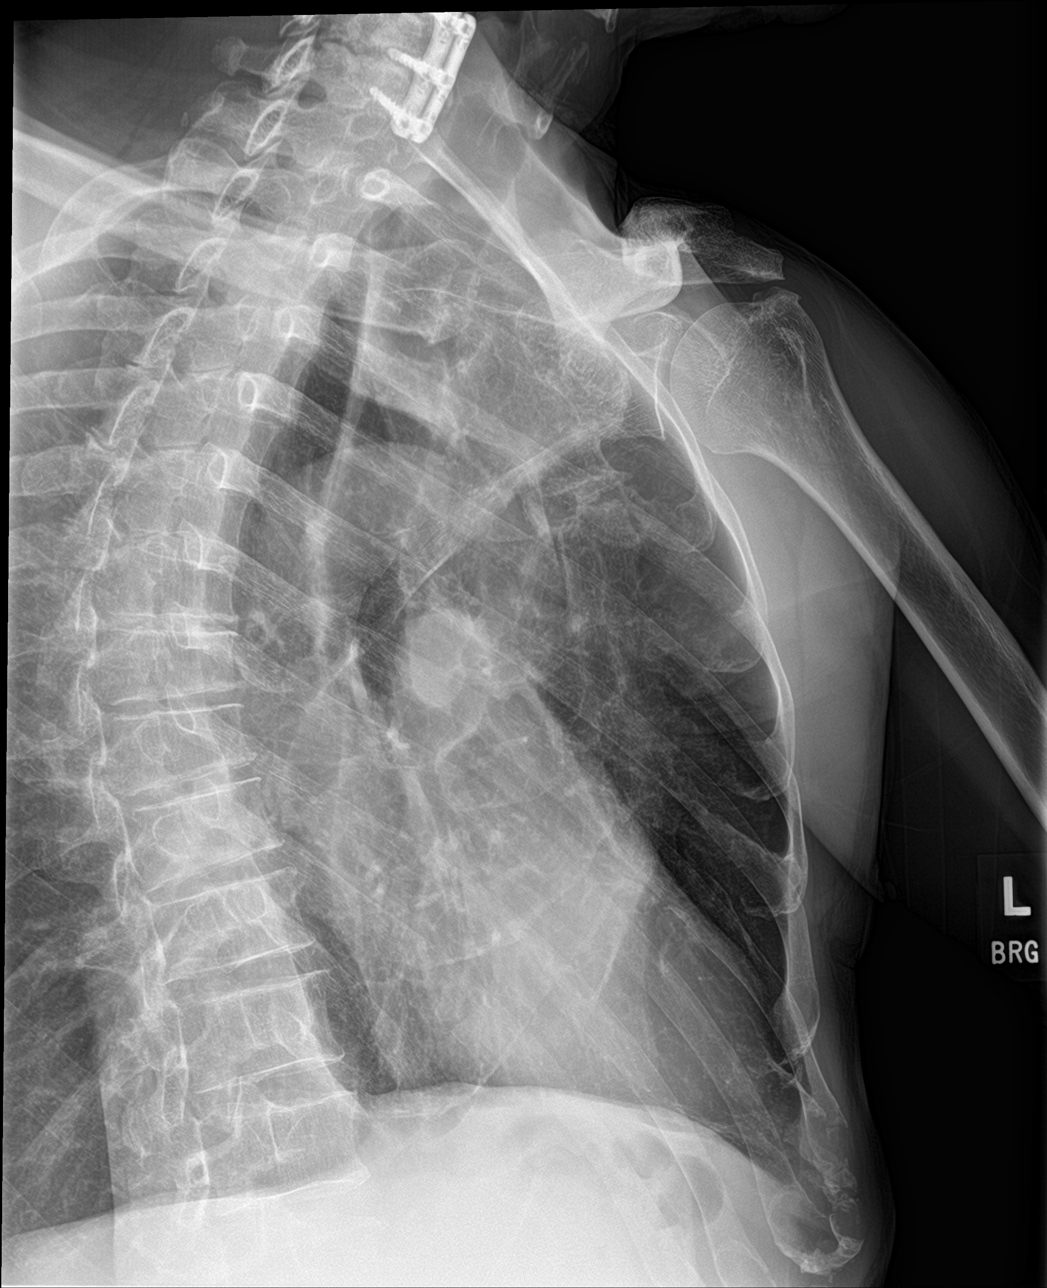

[rib pa obl (2 of 2)]
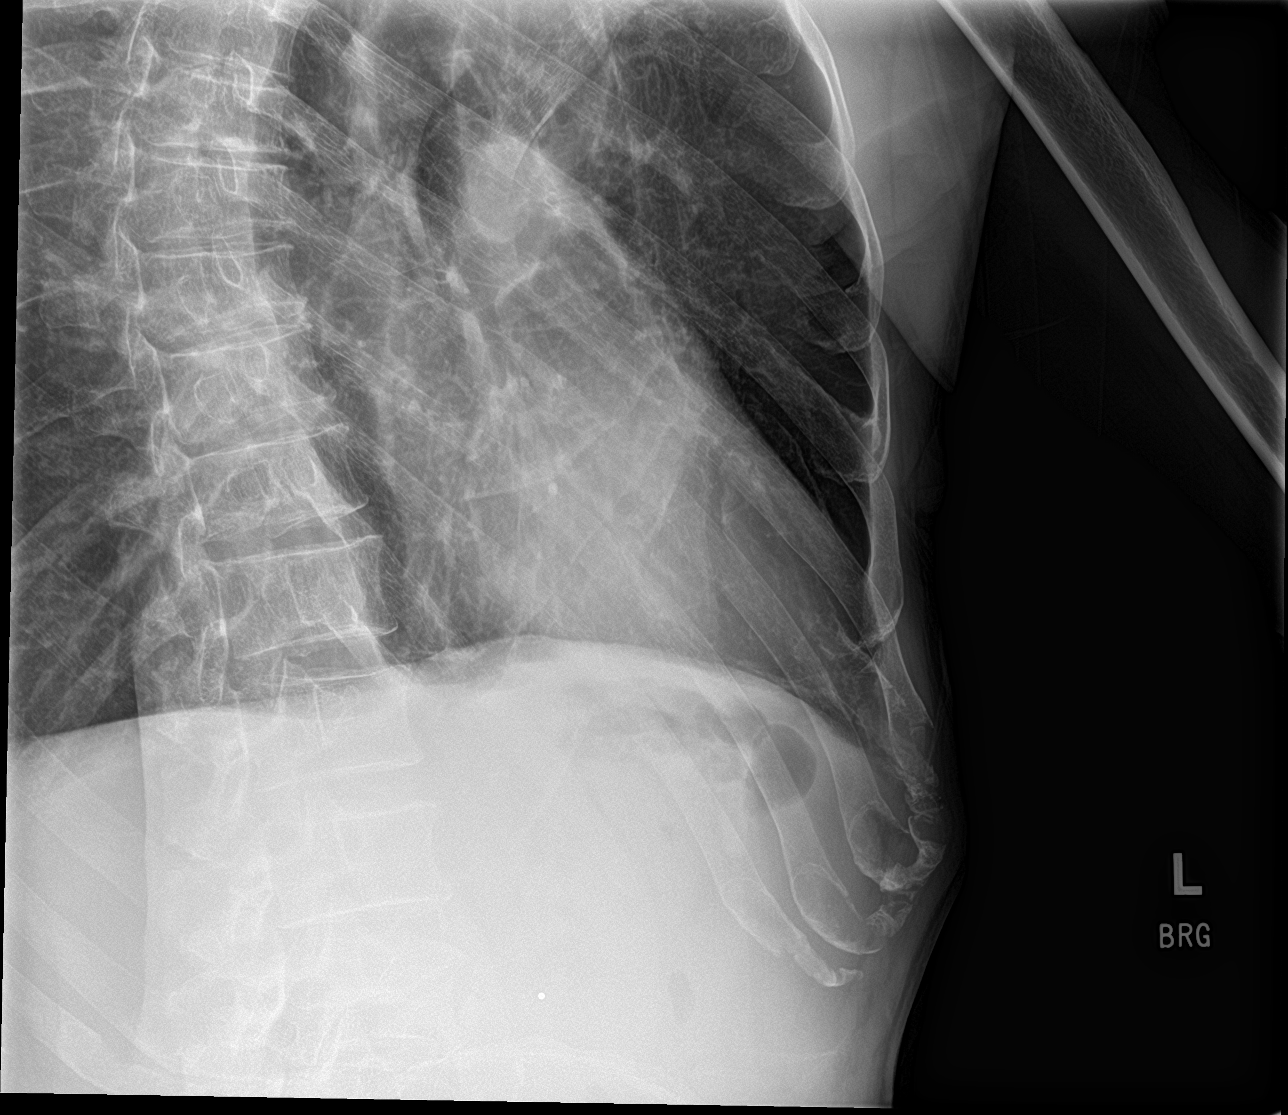

[rib pa (2 of 2)]
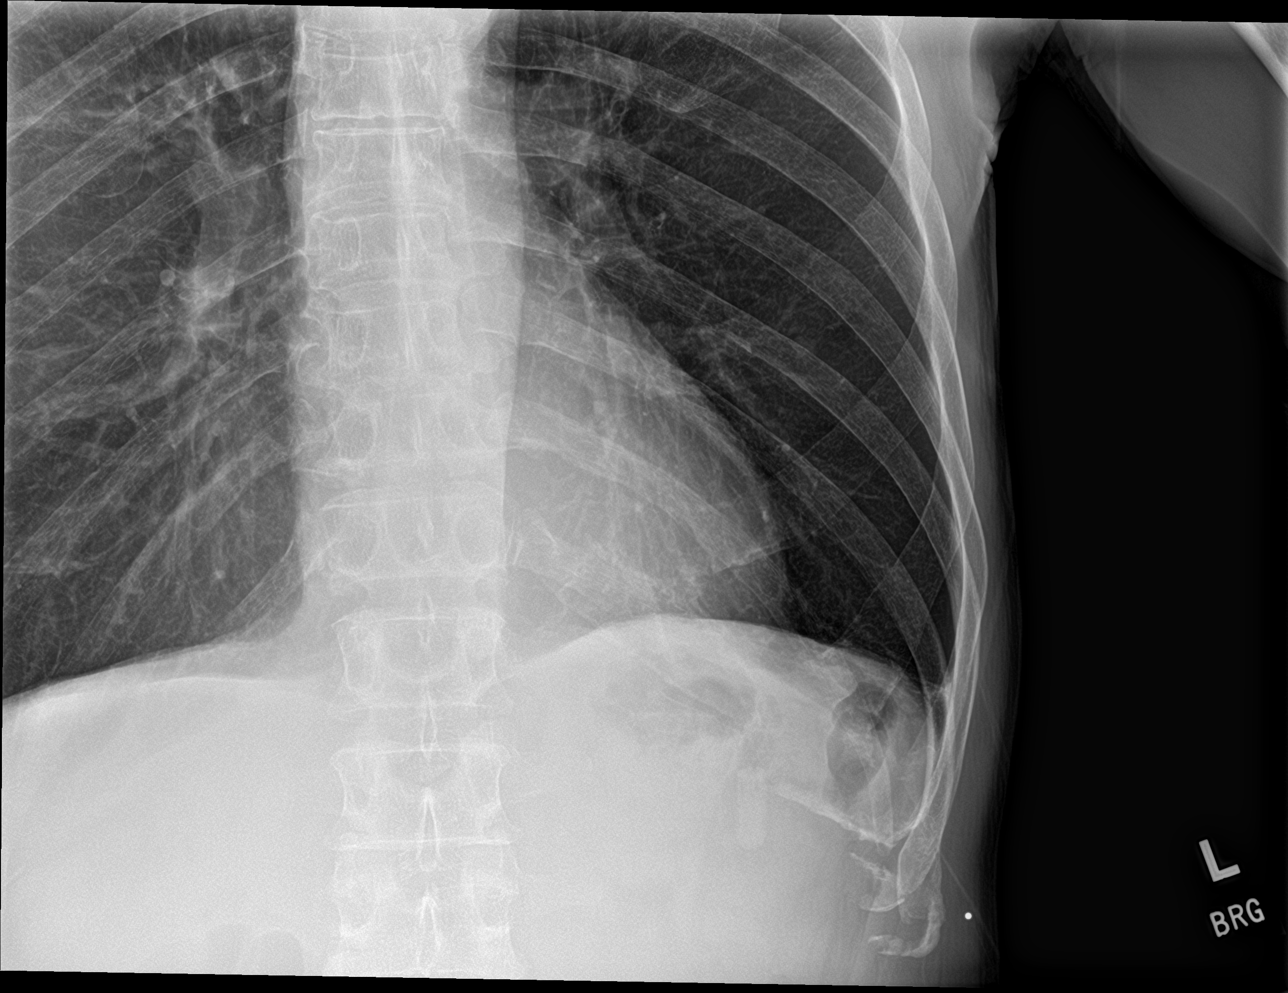

[4 of 4 positions shown; findings below may reference images not displayed]

FINDINGS: No fracture or other bone lesions are seen involving the ribs. No
pleural effusion or pneumothorax.
IMPRESSION: Negative.

## 2021-09-30 ENCOUNTER — Encounter (INDEPENDENT_AMBULATORY_CARE_PROVIDER_SITE_OTHER): Payer: Self-pay | Admitting: *Deleted

## 2021-10-24 LAB — HM COLONOSCOPY

## 2021-10-29 ENCOUNTER — Other Ambulatory Visit (INDEPENDENT_AMBULATORY_CARE_PROVIDER_SITE_OTHER): Payer: Self-pay

## 2021-10-29 DIAGNOSIS — Z86010 Personal history of colonic polyps: Secondary | ICD-10-CM

## 2021-11-24 ENCOUNTER — Telehealth (INDEPENDENT_AMBULATORY_CARE_PROVIDER_SITE_OTHER): Payer: Self-pay

## 2021-11-24 ENCOUNTER — Encounter (INDEPENDENT_AMBULATORY_CARE_PROVIDER_SITE_OTHER): Payer: Self-pay

## 2021-11-24 MED ORDER — PEG 3350-KCL-NA BICARB-NACL 420 G PO SOLR
4000.0000 mL | ORAL | 0 refills | Status: DC
Start: 2021-11-24 — End: 2021-12-24

## 2021-11-24 NOTE — Telephone Encounter (Signed)
Referring MD/PCP: Dr Anastasia Pall  Procedure: tcs  Reason/Indication:  hx of polyps   Has patient had this procedure before?  Yes   If so, when, by whom and where?  09/2016  Is there a family history of colon cancer?  No   Who?  What age when diagnosed?    Is patient diabetic? If yes, Type 1 or Type 2   no      Does patient have prosthetic heart valve or mechanical valve?  no  Do you have a pacemaker/defibrillator?  no  Has patient ever had endocarditis/atrial fibrillation? no  Does patient use oxygen? no  Has patient had joint replacement within last 12 months?  no  Is patient constipated or do they take laxatives? occasional  Does patient have a history of alcohol/drug use?  no  Have you had a stroke/heart attack last 6 mths? no  Do you take medicine for weight loss?  no  For male patients,: have you had a hysterectomy n/a                      are you post menopausal n/a                      do you still have your menstrual cycle n/a  Is patient on blood thinner such as Coumadin, Plavix and/or Aspirin? No   Medications: amlodipine 10 mg daily, pravastatin 80 mg daily, metoprolol 25 mg bid, tamsulosin 0.4 mg daily, fish oil 1000 mg daily, probiotics daily, aw palmetto 1200 mg daily coenzymes 1 per meal, turmeric 200 mg bid   Allergies: Codeine   Medication Adjustment per Dr Jenetta Downer none   Procedure date & time: 12/24/21 at 7:30

## 2021-11-24 NOTE — Telephone Encounter (Signed)
Kyle Rivers, CMA  ?

## 2021-12-24 ENCOUNTER — Encounter (HOSPITAL_COMMUNITY): Payer: Self-pay | Admitting: Gastroenterology

## 2021-12-24 ENCOUNTER — Other Ambulatory Visit: Payer: Self-pay

## 2021-12-24 ENCOUNTER — Ambulatory Visit (HOSPITAL_BASED_OUTPATIENT_CLINIC_OR_DEPARTMENT_OTHER): Payer: Medicare HMO | Admitting: Anesthesiology

## 2021-12-24 ENCOUNTER — Ambulatory Visit (HOSPITAL_COMMUNITY)
Admission: RE | Admit: 2021-12-24 | Discharge: 2021-12-24 | Disposition: A | Discharge status: Home / Self Care | Payer: Medicare HMO | Source: Ambulatory Visit | Attending: Gastroenterology | Admitting: Gastroenterology

## 2021-12-24 ENCOUNTER — Ambulatory Visit (HOSPITAL_COMMUNITY): Payer: Medicare HMO | Admitting: Anesthesiology

## 2021-12-24 ENCOUNTER — Encounter (HOSPITAL_COMMUNITY)
Admission: RE | Disposition: A | Discharge status: Home / Self Care | Payer: Self-pay | Source: Ambulatory Visit | Attending: Gastroenterology

## 2021-12-24 DIAGNOSIS — Z86010 Personal history of colonic polyps: Secondary | ICD-10-CM

## 2021-12-24 DIAGNOSIS — Z87891 Personal history of nicotine dependence: Secondary | ICD-10-CM | POA: Diagnosis not present

## 2021-12-24 DIAGNOSIS — E785 Hyperlipidemia, unspecified: Secondary | ICD-10-CM | POA: Insufficient documentation

## 2021-12-24 DIAGNOSIS — K635 Polyp of colon: Secondary | ICD-10-CM

## 2021-12-24 DIAGNOSIS — K648 Other hemorrhoids: Secondary | ICD-10-CM

## 2021-12-24 DIAGNOSIS — N400 Enlarged prostate without lower urinary tract symptoms: Secondary | ICD-10-CM | POA: Diagnosis not present

## 2021-12-24 DIAGNOSIS — I10 Essential (primary) hypertension: Secondary | ICD-10-CM | POA: Insufficient documentation

## 2021-12-24 DIAGNOSIS — Z79899 Other long term (current) drug therapy: Secondary | ICD-10-CM | POA: Diagnosis not present

## 2021-12-24 DIAGNOSIS — D125 Benign neoplasm of sigmoid colon: Secondary | ICD-10-CM | POA: Insufficient documentation

## 2021-12-24 DIAGNOSIS — K219 Gastro-esophageal reflux disease without esophagitis: Secondary | ICD-10-CM | POA: Insufficient documentation

## 2021-12-24 DIAGNOSIS — Z1211 Encounter for screening for malignant neoplasm of colon: Secondary | ICD-10-CM | POA: Diagnosis not present

## 2021-12-24 DIAGNOSIS — K5730 Diverticulosis of large intestine without perforation or abscess without bleeding: Secondary | ICD-10-CM

## 2021-12-24 DIAGNOSIS — Z09 Encounter for follow-up examination after completed treatment for conditions other than malignant neoplasm: Secondary | ICD-10-CM | POA: Diagnosis not present

## 2021-12-24 HISTORY — PX: COLONOSCOPY WITH PROPOFOL: SHX5780

## 2021-12-24 HISTORY — PX: POLYPECTOMY: SHX5525

## 2021-12-24 SURGERY — COLONOSCOPY WITH PROPOFOL
Anesthesia: General

## 2021-12-24 MED ORDER — PROPOFOL 10 MG/ML IV BOLUS
INTRAVENOUS | Status: DC | PRN
  Administered 2021-12-24: 60 mg via INTRAVENOUS

## 2021-12-24 MED ORDER — LACTATED RINGERS IV SOLN
INTRAVENOUS | Status: DC
Start: 2021-12-24 — End: 2021-12-24

## 2021-12-24 MED ORDER — PROPOFOL 500 MG/50ML IV EMUL
INTRAVENOUS | Status: DC | PRN
  Administered 2021-12-24: 150 ug/kg/min via INTRAVENOUS

## 2021-12-24 MED ORDER — PROPOFOL 500 MG/50ML IV EMUL
INTRAVENOUS | Status: AC
  Filled 2021-12-24: qty 50

## 2021-12-24 NOTE — Anesthesia Postprocedure Evaluation (Signed)
Anesthesia Post Note  Patient: Kyle Rivers  Procedure(s) Performed: COLONOSCOPY WITH PROPOFOL POLYPECTOMY  Patient location during evaluation: Phase II Anesthesia Type: General Level of consciousness: awake and alert and oriented Pain management: pain level controlled Vital Signs Assessment: post-procedure vital signs reviewed and stable Respiratory status: spontaneous breathing, respiratory function stable and nonlabored ventilation Cardiovascular status: blood pressure returned to baseline and stable Postop Assessment: no apparent nausea or vomiting Anesthetic complications: no   No notable events documented.   Last Vitals:  Vitals:   12/24/21 0655 12/24/21 0811  BP: (!) 152/85 107/61  Pulse: 64 72  Resp: 12 17  Temp: 36.6 C 36.6 C  SpO2: 99% 97%    Last Pain:  Vitals:   12/24/21 0811  TempSrc: Oral  PainSc: 0-No pain                 Desi Rowe C Roxanne Orner

## 2021-12-24 NOTE — Op Note (Signed)
[facility] Patient Name: Kyle Rivers Procedure Date: 12/24/2021 7:15 AM MRN: [id number] Date of Birth: 11-16-1955 Attending MD: Maylon Peppers ,  CSN: [id number] Age: 66 Admit Type: Outpatient Procedure:                Colonoscopy Indications:              Surveillance: Personal history of adenomatous                            polyps on last colonoscopy 5 years ago Providers:                Maylon Peppers, [place] Theda Sers RN, RN, [place]                            Page Referring MD:              Medicines:                Monitored Anesthesia Care Complications:            No immediate complications. Estimated Blood Loss:     Estimated blood loss: none. Procedure:                Pre-Anesthesia Assessment:                           - Prior to the procedure, a History and Physical                            was performed, and patient medications, allergies                            and sensitivities were reviewed. The patient's                            tolerance of previous anesthesia was reviewed.                           - The risks and benefits of the procedure and the                            sedation options and risks were discussed with the                            patient. All questions were answered and informed                            consent was obtained.                           - ASA Grade Assessment: II - A patient with mild                            systemic disease.                           After obtaining informed consent, the colonoscope  was passed under direct vision. Throughout the                            procedure, the patient's blood pressure, pulse, and                            oxygen saturations were monitored continuously. The                            [id number] ([id number]) was introduced through the                            anus and advanced to the the cecum, identified by                             appendiceal orifice and ileocecal valve. The                            colonoscopy was performed without difficulty. The                            patient tolerated the procedure well. The quality                            of the bowel preparation was good. Scope In: 7:35:08 AM Scope Out: 8:00:53 AM Scope Withdrawal Time: 0 hours 20 minutes 57 seconds  Total Procedure Duration: 0 hours 25 minutes 45 seconds  Findings:      The perianal and digital rectal examinations were normal.      A 3 mm polyp was found in the descending colon. The polyp was sessile.       The polyp was removed with a cold snare. Resection and retrieval were       complete.      Scattered small and large-mouthed diverticula were found in the sigmoid       colon, descending colon and ascending colon.      Non-bleeding internal hemorrhoids were found during retroflexion. The       hemorrhoids were small. Impression:               - One 3 mm polyp in the descending colon, removed                            with a cold snare. Resected and retrieved.                           - Diverticulosis in the sigmoid colon, in the                            descending colon and in the ascending colon.                           - Non-bleeding internal hemorrhoids. Moderate Sedation:      Per Anesthesia Care Recommendation:           - Discharge patient to home (ambulatory).                           -  Resume previous diet.                           - Await pathology results.                           - Repeat colonoscopy in 3 years for surveillance. Procedure Code(s):        --- Professional ---                           [phone], Colonoscopy, flexible; with removal of                            tumor(s), polyp(s), or other lesion(s) by snare                            technique Diagnosis Code(s):        --- Professional ---                           Z86.010, Personal history of colonic polyps                           K63.5,  Polyp of colon                           K64.8, Other hemorrhoids                           K57.30, Diverticulosis of large intestine without                            perforation or abscess without bleeding CPT copyright 2019 American Medical Association. All rights reserved. The codes documented in this report are preliminary and upon coder review may  be revised to meet current compliance requirements. Maylon Peppers, MD Maylon Peppers,  12/24/2021 8:05:23 AM This report has been signed electronically. Number of Addenda: 0

## 2021-12-24 NOTE — Discharge Instructions (Signed)
You are being discharged to home.  Resume your previous diet.  We are waiting for your pathology results.  Your physician has recommended a repeat colonoscopy in three years for surveillance.  

## 2021-12-24 NOTE — Anesthesia Preprocedure Evaluation (Signed)
Anesthesia Evaluation  Patient identified by MRN, date of birth, ID band Patient awake    Reviewed: Allergy & Precautions, NPO status , Patient's Chart, lab work & pertinent test results, reviewed documented beta blocker date and time   History of Anesthesia Complications Negative for: history of anesthetic complications  Airway Mallampati: II  TM Distance: >3 FB Neck ROM: Full    Dental  (+) Dental Advisory Given, Upper Dentures   Pulmonary neg pulmonary ROS, former smoker,    Pulmonary exam normal breath sounds clear to auscultation       Cardiovascular Exercise Tolerance: Good hypertension, Pt. on medications and Pt. on home beta blockers Normal cardiovascular exam+ dysrhythmias (tachycardia)  Rhythm:Regular Rate:Normal  Left ventricle: The cavity size was normal. Wall thickness was  increased in a pattern of moderate LVH. Systolic function was  vigorous. The estimated ejection fraction was in the range of 65%  to 70%. Wall motion was normal; there were no regional wall  motion abnormalities. Doppler parameters are consistent with  abnormal left ventricular relaxation (grade 1 diastolic  dysfunction). Doppler parameters are consistent with  indeterminate ventricular filling pressure.  - Mitral valve: There was mild regurgitation.  - Tricuspid valve: There was mild regurgitation.    Neuro/Psych negative neurological ROS  negative psych ROS   GI/Hepatic Neg liver ROS, GERD  Medicated and Controlled,  Endo/Other  negative endocrine ROS  Renal/GU negative Renal ROS  negative genitourinary   Musculoskeletal negative musculoskeletal ROS (+)   Abdominal   Peds negative pediatric ROS (+)  Hematology negative hematology ROS (+)   Anesthesia Other Findings   Reproductive/Obstetrics negative OB ROS                             Anesthesia Physical Anesthesia Plan  ASA:  2  Anesthesia Plan: General   Post-op Pain Management: Minimal or no pain anticipated   Induction: Intravenous  PONV Risk Score and Plan: Propofol infusion  Airway Management Planned: Nasal Cannula and Natural Airway  Additional Equipment:   Intra-op Plan:   Post-operative Plan:   Informed Consent: I have reviewed the patients History and Physical, chart, labs and discussed the procedure including the risks, benefits and alternatives for the proposed anesthesia with the patient or authorized representative who has indicated his/her understanding and acceptance.     Dental advisory given  Plan Discussed with: CRNA and Surgeon  Anesthesia Plan Comments:         Anesthesia Quick Evaluation

## 2021-12-24 NOTE — Pre-Procedure Instructions (Signed)
Dr. Charna Elizabeth notified via telephone, patient without vomiting postoperatively, ambulating to bathroom with assistance, without difficulty.  Per Dr. Charna Elizabeth, advise patient and wife if persistent nausea /vomiting to return to ED.

## 2021-12-24 NOTE — Transfer of Care (Signed)
Immediate Anesthesia Transfer of Care Note  Patient: Kyle Rivers  Procedure(s) Performed: COLONOSCOPY WITH PROPOFOL POLYPECTOMY  Patient Location: PACU  Anesthesia Type:General  Level of Consciousness: awake, alert  and oriented  Airway & Oxygen Therapy: Patient Spontanous Breathing  Post-op Assessment: Report given to RN, Post -op Vital signs reviewed and stable, Patient moving all extremities X 4 and Patient able to stick tongue midline  Post vital signs: Reviewed  Last Vitals:  Vitals Value Taken Time  BP 107/61   Temp 98.6   Pulse 66   Resp 26   SpO2 98     Last Pain:  Vitals:   12/24/21 0730  TempSrc:   PainSc: 0-No pain      Patients Stated Pain Goal: 4 (94/32/00 3794)  Complications: No notable events documented.

## 2021-12-24 NOTE — H&P (Signed)
Kyle Rivers is an 66 y.o. male.   Chief Complaint: History of colon polyps HPI: 65 year old male with past medical history of BPH, hyperlipidemia, hypertension and prostatitis, coming for history of colonic polyps.  Last colonoscopy was performed in 2018, had 5 polyps removed all of the polyps were tubular adenomas.  The patient denies having any complaints such as melena, hematochezia, abdominal pain or distention, change in her bowel movement consistency or frequency, no changes in weight recently.  No family history of colorectal cancer.   Past Medical History:  Diagnosis Date   BPH (benign prostatic hypertrophy)    DJD (degenerative joint disease)    in lower back    Ear infection 08/12/2016   pt believes he may be coming down with an ear infection today   Hemorrhoids    Hyperlipidemia    Hypertension    LVH (left ventricular hypertrophy)    LVH variant, EKG show ST elevation, normal cardiac catherization 2009   Prostatitis     Past Surgical History:  Procedure Laterality Date   ANTERIOR CERVICAL DECOMP/DISCECTOMY FUSION N/A 08/12/2016   Procedure: C5-6, C6-7 ANTERIOR CERVICAL DECOMPRESSION/DISCECTOMY FUSION 2 LEVELS, ALLOGRAFT, PLATE;  Surgeon: Marybelle Killings, MD;  Location: [place];  Service: Orthopedics;  Laterality: N/A;   CARDIAC CATHETERIZATION     CERVICAL [place] SURGERY  08/13/2015   c 5 c6 c7   COLONOSCOPY     COLONOSCOPY N/A 10/02/2016   Procedure: COLONOSCOPY;  Surgeon: Rogene Houston, MD;  [facility];  Service: Endoscopy;  Laterality: N/A;  12:45   dental inplants     HEMORRHOID SURGERY     POLYPECTOMY  10/02/2016   Procedure: POLYPECTOMY;  Surgeon: Rogene Houston, MD;  [facility];  Service: Endoscopy;;  colon   UPPER GASTROINTESTINAL ENDOSCOPY      Family History  Problem Relation Age of Onset   Stroke Mother    Hypertension Father    Diabetes Father    Prostate cancer Father    Diabetes Sister    Diabetes Sister    Social  History:  reports that he has quit smoking. His smoking use included cigarettes. He has a 15.00 pack-year smoking history. He has quit using smokeless tobacco. He reports current alcohol use. He reports that he does not use drugs.  Allergies:  Allergies  Allergen Reactions   Oxycodone     " makes me sick with nausea "   Lipitor [Atorvastatin] Rash and Other (See Comments)    "Bumps showed up on skin "    Medications Prior to Admission  Medication Sig Dispense Refill   amLODipine (NORVASC) 10 MG tablet TAKE 1 TABLET BY MOUTH  DAILY 90 tablet 3   fenofibrate (TRICOR) 48 MG tablet TAKE 1 TABLET BY MOUTH  DAILY 90 tablet 3   fish oil-omega-3 fatty acids 1000 MG capsule Take 2 g by mouth daily.      metoprolol tartrate (LOPRESSOR) 25 MG tablet Take 0.5 tablets (12.5 mg total) by mouth 2 (two) times daily.     pantoprazole (PROTONIX) 40 MG tablet TAKE 1 TABLET BY MOUTH  DAILY 90 tablet 3   pravastatin (PRAVACHOL) 80 MG tablet Take 80 mg by mouth daily.     Probiotic Product (ACIDOPHILUS/BIFIDUS PO) Take 1 capsule by mouth daily.     tamsulosin (FLOMAX) 0.4 MG CAPS capsule TAKE 1 CAPSULE BY MOUTH  DAILY 90 capsule 3   TURMERIC PO Take 1,000 mg by mouth daily.  esomeprazole (NEXIUM) 20 MG capsule Take 20 mg by mouth daily at 12 noon.     Menthol-Methyl Salicylate (MUSCLE RUB) 10-15 % CREA Apply 1 application topically as needed for muscle pain.     OVER THE COUNTER MEDICATION See admin instructions. Genius brand Digestion Opitimizer. Take 3-4 capsules daily.     polyethylene glycol-electrolytes (TRILYTE) 420 g solution Take 4,000 mLs by mouth as directed. 4000 mL 0    No results found for this or any previous visit (from the past 48 hour(s)). No results found.  Review of Systems  All other systems reviewed and are negative.   Blood pressure (!) 152/85, pulse 64, temperature 97.8 F (36.6 C), temperature source Oral, resp. rate 12, height '5\' 7"'$  (1.702 m), weight 61.2 kg, SpO2 99  %. Physical Exam  GENERAL: The patient is AO x3, in no acute distress. HEENT: Head is normocephalic and atraumatic. EOMI are intact. Mouth is well hydrated and without lesions. NECK: Supple. No masses LUNGS: Clear to auscultation. No presence of rhonchi/wheezing/rales. Adequate chest expansion HEART: RRR, normal s1 and s2. ABDOMEN: Soft, nontender, no guarding, no peritoneal signs, and nondistended. BS +. No masses. EXTREMITIES: Without any cyanosis, clubbing, rash, lesions or edema. NEUROLOGIC: AOx3, no focal motor deficit. SKIN: no jaundice, no rashes  Assessment/Plan 65 year old male with past medical history of BPH, hyperlipidemia, hypertension and prostatitis, coming for history of colonic polyps.  We will proceed with colonoscopy.  Harvel Quale, MD 12/24/2021, 7:26 AM

## 2021-12-25 ENCOUNTER — Encounter (INDEPENDENT_AMBULATORY_CARE_PROVIDER_SITE_OTHER): Payer: Self-pay | Admitting: *Deleted

## 2021-12-25 LAB — SURGICAL PATHOLOGY

## 2021-12-29 ENCOUNTER — Encounter (HOSPITAL_COMMUNITY): Payer: Self-pay | Admitting: Gastroenterology

## 2021-12-29 ENCOUNTER — Encounter (INDEPENDENT_AMBULATORY_CARE_PROVIDER_SITE_OTHER): Payer: Self-pay | Admitting: *Deleted

## 2022-02-25 ENCOUNTER — Telehealth: Payer: Self-pay | Admitting: Cardiology

## 2022-02-25 NOTE — Telephone Encounter (Signed)
Patient states he is returning a call but he does not know who called or what it may have been regarding. 

## 2022-02-27 ENCOUNTER — Ambulatory Visit: Payer: Commercial Managed Care - PPO | Admitting: Cardiology

## 2022-05-18 ENCOUNTER — Ambulatory Visit: Payer: Commercial Managed Care - PPO | Admitting: Cardiology

## 2022-06-26 ENCOUNTER — Encounter: Payer: Self-pay | Admitting: Cardiology

## 2022-06-26 ENCOUNTER — Ambulatory Visit: Payer: Medicare HMO | Attending: Cardiology | Admitting: Cardiology

## 2022-06-26 VITALS — BP 138/68 | HR 66 | Ht 67.0 in | Wt 138.0 lb

## 2022-06-26 DIAGNOSIS — R0602 Shortness of breath: Secondary | ICD-10-CM | POA: Diagnosis not present

## 2022-06-26 DIAGNOSIS — I723 Aneurysm of iliac artery: Secondary | ICD-10-CM

## 2022-06-26 DIAGNOSIS — E782 Mixed hyperlipidemia: Secondary | ICD-10-CM

## 2022-06-26 DIAGNOSIS — R002 Palpitations: Secondary | ICD-10-CM

## 2022-06-26 DIAGNOSIS — I10 Essential (primary) hypertension: Secondary | ICD-10-CM | POA: Diagnosis not present

## 2022-06-26 NOTE — Patient Instructions (Addendum)
Medication Instructions:  Your physician recommends that you continue on your current medications as directed. Please refer to the Current Medication list given to you today.   Labwork: CMET CBC Lipid Mag A1C  TSH  Testing/Procedures: None  Follow-Up: Follow up with Dr. Harl Bowie in 1 year.   Any Other Special Instructions Will Be Listed Below (If Applicable).  You have been referred to Dr. Donzetta Matters, Vascular. They will call you with your first appointment.    If you need a refill on your cardiac medications before your next appointment, please call your pharmacy.

## 2022-06-26 NOTE — Progress Notes (Signed)
Clinical Summary Kyle Rivers is a 67 y.o.male seen today for follow up of the following medical problems    1. Fatigue/SOB/Palpitations - - symptoms have resolved - has increased his oral hydration and symptoms have improved. Dizziness has resolved - palpitations have resolved.  -03/2018 echo LVEF 65-70%, no WMAs, grade I diastolic dysfunction [id number] nuclear stress: no ischemia     03/2018 monitor: Min HR 85, Max HR 193, Avg HR 85 Available strips show SVT up to 10 beats, NSVT up to 7 beats.      - no recent palpitations. Has done very well with lopressor       2. HTN - history of LVH, reports history HTN since age 62 - home bp's 120s-130s/70s-80s   3. Aortic atherosclerosis - noted by CT scann   4. Carotid stenosis - mild bilateral disease 2021 [place]     5. Hyperlipidemia - rash on lipitor, has been on pravastatin. Pcp recently increased to 68m daily.  - 12/2020 TC 188 TG 102  6. Right distal common iliac artery aneurysm - overdue for f/u  SH: [place], [place] there.   Past Medical History:  Diagnosis Date   BPH (benign prostatic hypertrophy)    DJD (degenerative joint disease)    in lower back    Ear infection 08/12/2016   pt believes he may be coming down with an ear infection today   Hemorrhoids    Hyperlipidemia    Hypertension    LVH (left ventricular hypertrophy)    LVH variant, EKG show ST elevation, normal cardiac catherization 2009   Prostatitis      Allergies  Allergen Reactions   Oxycodone     " makes me sick with nausea "   Lipitor [Atorvastatin] Rash and Other (See Comments)    "Bumps showed up on skin "     Current Outpatient Medications  Medication Sig Dispense Refill   amLODipine (NORVASC) 10 MG tablet TAKE 1 TABLET BY MOUTH  DAILY 90 tablet 3   esomeprazole (NEXIUM) 20 MG capsule Take 20 mg by mouth daily at 12 noon.     fenofibrate (TRICOR) 48 MG tablet TAKE 1 TABLET BY MOUTH  DAILY 90 tablet 3   fish  oil-omega-3 fatty acids 1000 MG capsule Take 2 g by mouth daily.      Menthol-Methyl Salicylate (MUSCLE RUB) 10-15 % CREA Apply 1 application topically as needed for muscle pain.     metoprolol tartrate (LOPRESSOR) 25 MG tablet Take 0.5 tablets (12.5 mg total) by mouth 2 (two) times daily.     OVER THE COUNTER MEDICATION See admin instructions. Genius brand Digestion Opitimizer. Take 3-4 capsules daily.     pantoprazole (PROTONIX) 40 MG tablet TAKE 1 TABLET BY MOUTH  DAILY 90 tablet 3   pravastatin (PRAVACHOL) 80 MG tablet Take 80 mg by mouth daily.     Probiotic Product (ACIDOPHILUS/BIFIDUS PO) Take 1 capsule by mouth daily.     tamsulosin (FLOMAX) 0.4 MG CAPS capsule TAKE 1 CAPSULE BY MOUTH  DAILY 90 capsule 3   TURMERIC PO Take 1,000 mg by mouth daily.      No current facility-administered medications for this visit.     Past Surgical History:  Procedure Laterality Date   ANTERIOR CERVICAL DECOMP/DISCECTOMY FUSION N/A 08/12/2016   Procedure: C5-6, C6-7 ANTERIOR CERVICAL DECOMPRESSION/DISCECTOMY FUSION 2 LEVELS, ALLOGRAFT, PLATE;  Surgeon: MMarybelle Killings MD;  Location: [facility]  Service: Orthopedics;  Laterality: N/A;  [place] SURGERY  08/13/2015   c 5 c6 c7   COLONOSCOPY     COLONOSCOPY N/A 10/02/2016   Procedure: COLONOSCOPY;  Surgeon: Rogene Houston, MD;  [facility];  Service: Endoscopy;  Laterality: N/A;  12:45   COLONOSCOPY WITH PROPOFOL N/A 12/24/2021   Procedure: COLONOSCOPY WITH PROPOFOL;  Surgeon: Harvel Quale, MD;  [facility];  Service: Gastroenterology;  Laterality: N/A;  730   dental inplants     HEMORRHOID SURGERY     POLYPECTOMY  10/02/2016   Procedure: POLYPECTOMY;  Surgeon: Rogene Houston, MD;  [facility];  Service: Endoscopy;;  colon   POLYPECTOMY  12/24/2021   Procedure: POLYPECTOMY;  Surgeon: Harvel Quale, MD;  [facility];  Service: Gastroenterology;;    UPPER GASTROINTESTINAL ENDOSCOPY       Allergies  Allergen Reactions   Oxycodone     " makes me sick with nausea "   Lipitor [Atorvastatin] Rash and Other (See Comments)    "Bumps showed up on skin "      Family History  Problem Relation Age of Onset   Stroke Mother    Hypertension Father    Diabetes Father    Prostate cancer Father    Diabetes Sister    Diabetes Sister      Social History Kyle Rivers reports that he has quit smoking. His smoking use included cigarettes. He has a 15.00 pack-year smoking history. He has quit using smokeless tobacco. Kyle Rivers reports current alcohol use.   Review of Systems CONSTITUTIONAL: No weight loss, fever, chills, weakness or fatigue.  HEENT: Eyes: No visual loss, blurred vision, double vision or yellow sclerae.No hearing loss, sneezing, congestion, runny nose or sore throat.  SKIN: No rash or itching.  CARDIOVASCULAR: per hpi RESPIRATORY: No shortness of breath, cough or sputum.  GASTROINTESTINAL: No anorexia, nausea, vomiting or diarrhea. No abdominal pain or blood.  GENITOURINARY: No burning on urination, no polyuria NEUROLOGICAL: No headache, dizziness, syncope, paralysis, ataxia, numbness or tingling in the extremities. No change in bowel or bladder control.  MUSCULOSKELETAL: No muscle, back pain, joint pain or stiffness.  LYMPHATICS: No enlarged nodes. No history of splenectomy.  PSYCHIATRIC: No history of depression or anxiety.  ENDOCRINOLOGIC: No reports of sweating, cold or heat intolerance. No polyuria or polydipsia.  Marland Kitchen   Physical Examination Today's Vitals   06/26/22 1515  BP: 138/68  Pulse: 66  SpO2: 97%  Weight: 138 lb (62.6 kg)  Height: 5' 7"$  (1.702 m)   Body mass index is 21.61 kg/m.  Gen: resting comfortably, no acute distress HEENT: no scleral icterus, pupils equal round and reactive, no palptable cervical adenopathy,  CV: RRR, no mrg, no jvd Resp: Clear to auscultation bilaterally GI: abdomen is soft,  non-tender, non-distended, normal bowel sounds, no hepatosplenomegaly MSK: extremities are warm, no edema.  Skin: warm, no rash Neuro:  no focal deficits Psych: appropriate affect   Diagnostic Studies   03/2018 echo Study Conclusions   - Left ventricle: The cavity size was normal. Wall thickness was   increased in a pattern of moderate LVH. Systolic function was   vigorous. The estimated ejection fraction was in the range of 65%   to 70%. Wall motion was normal; there were no regional wall   motion abnormalities. Doppler parameters are consistent with   abnormal left ventricular relaxation (grade 1 diastolic   dysfunction). Doppler parameters are consistent with  indeterminate ventricular filling pressure. - Mitral valve: There was mild regurgitation. - Tricuspid valve: There was mild regurgitation.   03/2018 nuclear stress Blood pressure demonstrated a hypertensive response to exercise. LVH with diffuse repolarization abnormalities seen throughout study. The study is normal. No evidence of myocardial ischemia or scar. This is a low risk study. Nuclear stress EF: 53%.    Assessment and Plan  1. Palpitations/SOB - monitor shows some NSVT and PSVT - echo and nuclear stress do not show any significant underlying heart disease -has done well on low dose lopressor, continue current meds     2. HTN - mildly elevated here, home bp's are at goal. Continue current meds     3. Hyperlipidemia - rash on atorvastatin - tolerating pravastatin and pcp just increased dose, continue to monotir  4.Iliac artery aneurysm - overdue for f/u with vascular, will place referral.     Arnoldo Lenis, M.D.

## 2022-06-29 ENCOUNTER — Other Ambulatory Visit (HOSPITAL_COMMUNITY)
Admission: RE | Admit: 2022-06-29 | Discharge: 2022-06-29 | Disposition: A | Discharge status: Home / Self Care | Payer: Medicare HMO | Source: Ambulatory Visit | Attending: Cardiology | Admitting: Cardiology

## 2022-06-29 DIAGNOSIS — R0602 Shortness of breath: Secondary | ICD-10-CM | POA: Diagnosis present

## 2022-06-29 DIAGNOSIS — E782 Mixed hyperlipidemia: Secondary | ICD-10-CM | POA: Diagnosis not present

## 2022-06-29 DIAGNOSIS — I10 Essential (primary) hypertension: Secondary | ICD-10-CM | POA: Insufficient documentation

## 2022-06-29 DIAGNOSIS — R002 Palpitations: Secondary | ICD-10-CM | POA: Diagnosis present

## 2022-06-29 DIAGNOSIS — R739 Hyperglycemia, unspecified: Secondary | ICD-10-CM | POA: Diagnosis not present

## 2022-06-29 LAB — COMPREHENSIVE METABOLIC PANEL
ALT: 39 U/L (ref 0–44)
AST: 31 U/L (ref 15–41)
Albumin: 3.9 g/dL (ref 3.5–5.0)
Alkaline Phosphatase: 84 U/L (ref 38–126)
Anion gap: 9 (ref 5–15)
BUN: 14 mg/dL (ref 8–23)
CO2: 26 mmol/L (ref 22–32)
Calcium: 9.2 mg/dL (ref 8.9–10.3)
Chloride: 101 mmol/L (ref 98–111)
Creatinine, Ser: 1.13 mg/dL (ref 0.61–1.24)
GFR, Estimated: >60 mL/min (ref >60)
Glucose, Bld: 100 mg/dL — ABNORMAL HIGH (ref 70–99)
Potassium: 3.9 mmol/L (ref 3.5–5.1)
Sodium: 136 mmol/L (ref 135–145)
Total Bilirubin: 0.8 mg/dL (ref 0.3–1.2)
Total Protein: 8 g/dL (ref 6.5–8.1)

## 2022-06-29 LAB — CBC
HCT: 41.4 % (ref 39.0–52.0)
Hemoglobin: 14.3 g/dL (ref 13.0–17.0)
MCH: 33 pg (ref 26.0–34.0)
MCHC: 34.5 g/dL (ref 30.0–36.0)
MCV: 95.6 fL (ref 80.0–100.0)
Platelets: 180 10*3/uL (ref 150–400)
RBC: 4.33 MIL/uL (ref 4.22–5.81)
RDW: 13.2 % (ref 11.5–15.5)
WBC: 6.9 10*3/uL (ref 4.0–10.5)
nRBC: 0 % (ref 0.0–0.2)

## 2022-06-29 LAB — LIPID PANEL
Cholesterol: 217 mg/dL — ABNORMAL HIGH (ref 0–200)
HDL: 62 mg/dL (ref >40)
LDL Cholesterol: 118 mg/dL — ABNORMAL HIGH (ref 0–99)
Total CHOL/HDL Ratio: 3.5 RATIO
Triglycerides: 186 mg/dL — ABNORMAL HIGH (ref <150)
VLDL: 37 mg/dL (ref 0–40)

## 2022-06-29 LAB — MAGNESIUM: Magnesium: 2.1 mg/dL (ref 1.7–2.4)

## 2022-06-29 LAB — TSH: TSH: 3.466 u[IU]/mL (ref 0.350–4.500)

## 2022-06-29 LAB — HEMOGLOBIN A1C
Hgb A1c MFr Bld: 5.1 % (ref 4.8–5.6)
Mean Plasma Glucose: 99.67 mg/dL

## 2022-06-30 ENCOUNTER — Other Ambulatory Visit: Payer: Self-pay | Admitting: *Deleted

## 2022-06-30 DIAGNOSIS — I700 Atherosclerosis of aorta: Secondary | ICD-10-CM

## 2022-07-08 ENCOUNTER — Ambulatory Visit (HOSPITAL_COMMUNITY)
Admission: RE | Admit: 2022-07-08 | Discharge: 2022-07-08 | Disposition: A | Discharge status: Home / Self Care | Payer: Medicare HMO | Source: Ambulatory Visit | Attending: Vascular Surgery | Admitting: Vascular Surgery

## 2022-07-08 ENCOUNTER — Other Ambulatory Visit: Payer: Self-pay | Admitting: Physician Assistant

## 2022-07-08 ENCOUNTER — Ambulatory Visit (INDEPENDENT_AMBULATORY_CARE_PROVIDER_SITE_OTHER): Payer: Medicare HMO | Admitting: Physician Assistant

## 2022-07-08 VITALS — BP 163/93 | HR 69 | Temp 98.1°F | Resp 20 | Ht 67.0 in | Wt 140.0 lb

## 2022-07-08 DIAGNOSIS — I723 Aneurysm of iliac artery: Secondary | ICD-10-CM | POA: Diagnosis not present

## 2022-07-08 DIAGNOSIS — I700 Atherosclerosis of aorta: Secondary | ICD-10-CM | POA: Insufficient documentation

## 2022-07-08 DIAGNOSIS — I714 Abdominal aortic aneurysm, without rupture, unspecified: Secondary | ICD-10-CM | POA: Insufficient documentation

## 2022-07-08 NOTE — Progress Notes (Signed)
HISTORY AND PHYSICAL     CC:  follow up. Requesting Provider:  Chesley Noon, MD  HPI: This is a 67 y.o. male who is here today for follow up for AAA.    Pt was last seen 06/23/2019 and at that time, pt was seen by Dr. Donzetta Matters.  Pt had a CT scan for diverticulitis and was found to have a focal right iliac artery aneurysm.  He did not have any other vascular hx.  He had some rhythm disturbances without other heart disease.  The largest diameter of the aorta measured 3.2cm and there was saccular aneurysm at the distal right CIA.  The pt returns today for follow up.  He denies any new abdominal or back pain.  He does have some pain that radiates down his right outer leg and was diagnosed with sciatica.  Otherwise, denies claudication, rest pain or non healing wounds.  He tells me he quit smoking about 5-6 years ago.  He is not taking asa.  He was told my MD that he did not need to take it.    The pt is on a statin for cholesterol management.    The pt is not on an aspirin.    Other AC:  none The pt is on BB, CCB for hypertension.  The pt not have diabetes. Tobacco hx:  former  Pt does not have family hx of AAA.  Past Medical History:  Diagnosis Date   BPH (benign prostatic hypertrophy)    DJD (degenerative joint disease)    in lower back    Ear infection 08/12/2016   pt believes he may be coming down with an ear infection today   Hemorrhoids    Hyperlipidemia    Hypertension    LVH (left ventricular hypertrophy)    LVH variant, EKG show ST elevation, normal cardiac catherization 2009   Prostatitis     Past Surgical History:  Procedure Laterality Date   ANTERIOR CERVICAL DECOMP/DISCECTOMY FUSION N/A 08/12/2016   Procedure: C5-6, C6-7 ANTERIOR CERVICAL DECOMPRESSION/DISCECTOMY FUSION 2 LEVELS, ALLOGRAFT, PLATE;  Surgeon: Marybelle Killings, MD;  Location: [facility];  Service: Orthopedics;  Laterality: N/A;   CARDIAC CATHETERIZATION     CERVICAL [place] SURGERY  08/13/2015   c 5 c6 c7    COLONOSCOPY     COLONOSCOPY N/A 10/02/2016   Procedure: COLONOSCOPY;  Surgeon: Rogene Houston, MD;  [facility];  Service: Endoscopy;  Laterality: N/A;  12:45   COLONOSCOPY WITH PROPOFOL N/A 12/24/2021   Procedure: COLONOSCOPY WITH PROPOFOL;  Surgeon: Harvel Quale, MD;  [facility];  Service: Gastroenterology;  Laterality: N/A;  730   dental inplants     HEMORRHOID SURGERY     POLYPECTOMY  10/02/2016   Procedure: POLYPECTOMY;  Surgeon: Rogene Houston, MD;  [facility];  Service: Endoscopy;;  colon   POLYPECTOMY  12/24/2021   Procedure: POLYPECTOMY;  Surgeon: Montez Morita, Quillian Quince, MD;  [facility];  Service: Gastroenterology;;   UPPER GASTROINTESTINAL ENDOSCOPY      Allergies  Allergen Reactions   Atorvastatin Other (See Comments) and Rash    "Bumps showed up on skin "   Oxycodone Nausea Only and Other (See Comments)    " makes me sick with nausea "    Current Outpatient Medications  Medication Sig Dispense Refill   amLODipine (NORVASC) 10 MG tablet TAKE 1 TABLET BY MOUTH  DAILY 90 tablet 3   esomeprazole (NEXIUM) 20 MG capsule  Take 20 mg by mouth daily at 12 noon.     fenofibrate (TRICOR) 48 MG tablet TAKE 1 TABLET BY MOUTH  DAILY 90 tablet 3   fish oil-omega-3 fatty acids 1000 MG capsule Take 2 g by mouth daily.      Menthol-Methyl Salicylate (MUSCLE RUB) 10-15 % CREA Apply 1 application topically as needed for muscle pain.     metoprolol tartrate (LOPRESSOR) 25 MG tablet Take 0.5 tablets (12.5 mg total) by mouth 2 (two) times daily.     OVER THE COUNTER MEDICATION See admin instructions. Genius brand Digestion Opitimizer. Take 3-4 capsules daily.     pantoprazole (PROTONIX) 40 MG tablet TAKE 1 TABLET BY MOUTH  DAILY 90 tablet 3   pravastatin (PRAVACHOL) 80 MG tablet Take 80 mg by mouth daily.     Probiotic Product (ACIDOPHILUS/BIFIDUS PO) Take 1 capsule by mouth daily.     tamsulosin (FLOMAX) 0.4 MG CAPS capsule  TAKE 1 CAPSULE BY MOUTH  DAILY 90 capsule 3   TURMERIC PO Take 1,000 mg by mouth daily.      No current facility-administered medications for this visit.    Family History  Problem Relation Age of Onset   Stroke Mother    Hypertension Father    Diabetes Father    Prostate cancer Father    Diabetes Sister    Diabetes Sister     Social History   Socioeconomic History   Marital status: Married    Spouse name: Not on file   Number of children: Not on file   Years of education: Not on file   Highest education level: Not on file  Occupational History   Not on file  Tobacco Use   Smoking status: Former    Packs/day: 0.50    Years: 30.00    Total pack years: 15.00    Types: Cigarettes   Smokeless tobacco: Former   Tobacco comments:    smokes light, somedays none  Vaping Use   Vaping Use: Never used  Substance and Sexual Activity   Alcohol use: Yes    Comment: Occ   Drug use: No   Sexual activity: Yes  Other Topics Concern   Not on file  Social History Narrative   Not on file   Social Determinants of Health   Financial Resource Strain: Not on file  Food Insecurity: Not on file  Transportation Needs: Not on file  Physical Activity: Not on file  Stress: Not on file  Social Connections: Not on file  Intimate Partner Violence: Not on file     REVIEW OF SYSTEMS:   '[X]'$  denotes positive finding, '[ ]'$  denotes negative finding Cardiac  Comments:  Chest pain or chest pressure:    Shortness of breath upon exertion:    Short of breath when lying flat:    Irregular heart rhythm:        Vascular    Pain in calf, thigh, or hip brought on by ambulation:    Pain in feet at night that wakes you up from your sleep:     Blood clot in your veins:    Leg swelling:         Pulmonary    Oxygen at home:    Productive cough:     Wheezing:         Neurologic    Sudden weakness in arms or legs:     Sudden numbness in arms or legs:     Sudden onset of difficulty speaking  or  slurred speech:    Temporary loss of vision in one eye:     Problems with dizziness:         Gastrointestinal    Blood in stool:     Vomited blood:         Genitourinary    Burning when urinating:     Blood in urine:        Psychiatric    Major depression:         Hematologic    Bleeding problems:    Problems with blood clotting too easily:        Skin    Rashes or ulcers:        Constitutional    Fever or chills:      PHYSICAL EXAMINATION:  Today's Vitals   07/08/22 1107  BP: (!) 163/93  Pulse: 69  Resp: 20  Temp: 98.1 F (36.7 C)  TempSrc: Temporal  SpO2: 98%  Weight: 140 lb (63.5 kg)  Height: '5\' 7"'$  (1.702 m)   Body mass index is 21.93 kg/m.   General:  WDWN in NAD; vital signs documented above Gait: Not observed HENT: WNL, normocephalic Pulmonary: normal non-labored breathing  Cardiac: regular HR, without carotid bruits Abdomen: soft, NT; aortic pulse is  palpable Skin: without rashes Vascular Exam/Pulses:  Right Left  Radial 2+ (normal) 2+ (normal)  Femoral 2+ (normal) 2+ (normal)  Popliteal Unable to palpate Unable to palpate  DP 2+ (normal) 2+ (normal)  PT 2+ (normal) 2+ (normal)   Extremities: without ischemic changes, without Gangrene , without cellulitis; without open wounds Musculoskeletal: no muscle wasting or atrophy  Neurologic: A&O X 3;  No focal weakness or paresthesias are detected Psychiatric:  The pt has Normal affect.   Non-Invasive Vascular Imaging:   AAA Arterial duplex on 07/08/2022: Abdominal Aorta Findings:  +-------------+-------+----------+----------+--------+--------+--------+  Location    AP (cm)Trans (cm)PSV (cm/s)WaveformThrombusComments  +-------------+-------+----------+----------+--------+--------+--------+  Supraceliac 2.57   2.86      66                                  +-------------+-------+----------+----------+--------+--------+--------+  Mid         1.82   1.78      62                                   +-------------+-------+----------+----------+--------+--------+--------+  Distal      2.00   2.14      57                                  +-------------+-------+----------+----------+--------+--------+--------+  RT CIA Prox  1.4    1.3       65        biphasic                  +-------------+-------+----------+----------+--------+--------+--------+  RT CIA Distal2.2    2.1       119       biphasic                  +-------------+-------+----------+----------+--------+--------+--------+  LT CIA Prox  1.2    1.2       64        biphasic                  +-------------+-------+----------+----------+--------+--------+--------+  Summary:  Abdominal Aorta: No evidence of an abdominal aortic aneurysm was visualized. There is evidence of abnormal dilation of the distal right common iliac artery.   Previous AAA arterial duplex on 06/23/2019: Summary:  Abdominal Aorta: The largest aortic measurement is 3.2 cm. Ectatic proximal aorta.  Evidence of saccular aneurysm at the distal common iliac artery with a maximum diameter of 2.1 cm.    ASSESSMENT/PLAN:: 67 y.o. male here for follow up for AAA/right CIA aneurysm  -pt doing well without new abdominal or back pain. He does have some right sided sciatica but no claudication, rest pain or non healing wounds.  His right CIA aneurysm is essentially unchanged from 3 years ago.  He has palpable pedal pulses.   -pt will f/u in 2 years with aortoiliac duplex.  Discussed that if he develops sudden severe abdominal or back pain, he is to call 911.   -discussed there can be a familial component to aneurysms and at some point his children would need to be screened and that medicare pays for a free screening.   -continue statin.  Recommended he start taking a baby asa daily. -fortunately he quit smoking about 5-6 years ago.  Discussed the importance of remaining smoke free.    Leontine Locket, [facility] [phone]  Clinic MD:   Donzetta Matters

## 2022-07-09 ENCOUNTER — Encounter: Payer: Self-pay | Admitting: Radiology

## 2022-07-13 ENCOUNTER — Encounter: Payer: Self-pay | Admitting: *Deleted

## 2022-07-20 ENCOUNTER — Encounter: Payer: Self-pay | Admitting: Internal Medicine

## 2022-07-20 ENCOUNTER — Ambulatory Visit (INDEPENDENT_AMBULATORY_CARE_PROVIDER_SITE_OTHER): Payer: Medicare HMO | Admitting: Internal Medicine

## 2022-07-20 ENCOUNTER — Other Ambulatory Visit: Payer: Self-pay | Admitting: *Deleted

## 2022-07-20 VITALS — BP 147/87 | HR 67 | Ht 67.0 in | Wt 135.6 lb

## 2022-07-20 DIAGNOSIS — N400 Enlarged prostate without lower urinary tract symptoms: Secondary | ICD-10-CM | POA: Diagnosis not present

## 2022-07-20 DIAGNOSIS — Z0001 Encounter for general adult medical examination with abnormal findings: Secondary | ICD-10-CM | POA: Insufficient documentation

## 2022-07-20 DIAGNOSIS — Z2821 Immunization not carried out because of patient refusal: Secondary | ICD-10-CM | POA: Diagnosis not present

## 2022-07-20 DIAGNOSIS — K219 Gastro-esophageal reflux disease without esophagitis: Secondary | ICD-10-CM | POA: Insufficient documentation

## 2022-07-20 DIAGNOSIS — I10 Essential (primary) hypertension: Secondary | ICD-10-CM

## 2022-07-20 DIAGNOSIS — E782 Mixed hyperlipidemia: Secondary | ICD-10-CM

## 2022-07-20 MED ORDER — EZETIMIBE 10 MG PO TABS: 10.0000 mg | ORAL_TABLET | Freq: Every day | ORAL | 6 refills | Status: DC

## 2022-07-20 NOTE — Assessment & Plan Note (Signed)
Symptoms are currently well-controlled with Flomax 0.4 mg daily -No medication changes today

## 2022-07-20 NOTE — Progress Notes (Signed)
New Patient Office Visit  Subjective    Patient ID: Kyle Rivers, male    DOB: Dec 21, 1955  Age: 67 y.o. MRN: [id number]  CC:  Chief Complaint  Patient presents with   Establish Care    HPI Kyle Rivers presents to establish care.  He is a 67 year old male who endorses a past medical history significant for HTN, HLD, GERD, BPH, aortic atherosclerosis, and carotid artery disease.  He was previously followed by Dr. Melford Rivers with [facility] in [place] and is also followed by [facility] (Dr. Harl Rivers).  Kyle Rivers reports feeling well today.  He would like to establish care with a provider closer to his home.  He is asymptomatic and has no acute concerns to discuss today.  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  He is a former smoker but quit smoking cigarettes 6 years ago.  He denies alcohol and illicit substance use.  His family medical history is remarkable for breast cancer in his mother and CAD in his father.  Chronic medical conditions and outstanding preventative care items discussed today are individually addressed A/P below.  Outpatient Encounter Medications as of 07/20/2022  Medication Sig   amLODipine (NORVASC) 10 MG tablet TAKE 1 TABLET BY MOUTH  DAILY   esomeprazole (NEXIUM) 20 MG capsule Take 20 mg by mouth daily at 12 noon.   fenofibrate (TRICOR) 48 MG tablet TAKE 1 TABLET BY MOUTH  DAILY   fish oil-omega-3 fatty acids 1000 MG capsule Take 2 g by mouth daily.    Menthol-Methyl Salicylate (MUSCLE RUB) 10-15 % CREA Apply 1 application topically as needed for muscle pain.   metoprolol tartrate (LOPRESSOR) 25 MG tablet Take 0.5 tablets (12.5 mg total) by mouth 2 (two) times daily.   OVER THE COUNTER MEDICATION See admin instructions. [employer]ius brand Digestion Opitimizer. Take 3-4 capsules daily.   pantoprazole (PROTONIX) 40 MG tablet TAKE 1 TABLET BY MOUTH  DAILY   pravastatin (PRAVACHOL) 80 MG tablet Take 80 mg by mouth daily.   Probiotic Product (ACIDOPHILUS/BIFIDUS PO)  Take 1 capsule by mouth daily.   tamsulosin (FLOMAX) 0.4 MG CAPS capsule TAKE 1 CAPSULE BY MOUTH  DAILY   TURMERIC PO Take 1,000 mg by mouth daily.    [DISCONTINUED] ezetimibe (ZETIA) 10 MG tablet Take 1 tablet (10 mg total) by mouth daily.   No facility-administered encounter medications on file as of 07/20/2022.    Past Medical History:  Diagnosis Date   BPH (benign prostatic hypertrophy)    DJD (degenerative joint disease)    in lower back    Ear infection 08/12/2016   pt believes he may be coming down with an ear infection today   Hemorrhoids    Hyperlipidemia    Hypertension    LVH (left ventricular hypertrophy)    LVH variant, EKG show ST elevation, normal cardiac catherization 2009   Prostatitis     Past Surgical History:  Procedure Laterality Date   ANTERIOR CERVICAL DECOMP/DISCECTOMY FUSION N/A 08/12/2016   Procedure: C5-6, C6-7 ANTERIOR CERVICAL DECOMPRESSION/DISCECTOMY FUSION 2 LEVELS, ALLOGRAFT, PLATE;  Surgeon: Kyle Killings, MD;  Location: [facility];  Service: Orthopedics;  Laterality: N/A;   CARDIAC CATHETERIZATION     CERVICAL [place] SURGERY  08/13/2015   c 5 c6 c7   COLONOSCOPY     COLONOSCOPY N/A 10/02/2016   Procedure: COLONOSCOPY;  Surgeon: Kyle Houston, MD;  [facility];  Service: Endoscopy;  Laterality: N/A;  12:45   COLONOSCOPY WITH PROPOFOL N/A  12/24/2021   Procedure: COLONOSCOPY WITH PROPOFOL;  Surgeon: Kyle Quale, MD;  [facility];  Service: Gastroenterology;  Laterality: N/A;  730   dental inplants     HEMORRHOID SURGERY     POLYPECTOMY  10/02/2016   Procedure: POLYPECTOMY;  Surgeon: Kyle Houston, MD;  [facility];  Service: Endoscopy;;  colon   POLYPECTOMY  12/24/2021   Procedure: POLYPECTOMY;  Surgeon: Kyle Quale, MD;  [facility];  Service: Gastroenterology;;   UPPER GASTROINTESTINAL ENDOSCOPY      Family History  Problem Relation Age of Onset   Stroke Mother     Hypertension Father    Diabetes Father    Prostate cancer Father    Diabetes Sister    Diabetes Sister     Social History   Socioeconomic History   Marital status: Married    Spouse name: Not on file   Number of children: Not on file   Years of education: Not on file   Highest education level: Not on file  Occupational History   Not on file  Tobacco Use   Smoking status: Former    Packs/day: 0.50    Years: 30.00    Total pack years: 15.00    Types: Cigarettes    Passive exposure: Never   Smokeless tobacco: Former   Tobacco comments:    smokes light, somedays none  Vaping Use   Vaping Use: Never used  Substance and Sexual Activity   Alcohol use: Yes    Comment: Occ   Drug use: No   Sexual activity: Yes  Other Topics Concern   Not on file  Social History Narrative   Not on file   Social Determinants of Health   Financial Resource Strain: Not on file  Food Insecurity: Not on file  Transportation Needs: Not on file  Physical Activity: Not on file  Stress: Not on file  Social Connections: Not on file  Intimate Partner Violence: Not on file   Review of Systems  Constitutional:  Negative for chills and fever.  HENT:  Negative for sore throat.   Respiratory:  Negative for cough and shortness of breath.   Cardiovascular:  Negative for chest pain, palpitations and leg swelling.  Gastrointestinal:  Negative for abdominal pain, blood in stool, constipation, diarrhea, nausea and vomiting.  Genitourinary:  Negative for dysuria and hematuria.  Musculoskeletal:  Negative for myalgias.  Skin:  Negative for itching and rash.  Neurological:  Negative for dizziness and headaches.  Psychiatric/Behavioral:  Negative for depression and suicidal ideas.    Objective    BP (!) 147/87   Pulse 67   Ht '5\' 7"'$  (1.702 m)   Wt 135 lb 9.6 oz (61.5 kg)   SpO2 99%   BMI 21.24 kg/m   Physical Exam Vitals reviewed.  Constitutional:      General: He is not in acute distress.     Appearance: Normal appearance. He is not ill-appearing.  HENT:     Head: Normocephalic and atraumatic.     Right Ear: External ear normal.     Left Ear: External ear normal.     Nose: Nose normal. No congestion or rhinorrhea.     Mouth/Throat:     Mouth: Mucous membranes are moist.     Pharynx: Oropharynx is clear.  Eyes:     General: No scleral icterus.    Extraocular Movements: Extraocular movements intact.     Conjunctiva/sclera: Conjunctivae normal.  Pupils: Pupils are equal, round, and reactive to light.  Cardiovascular:     Rate and Rhythm: Normal rate and regular rhythm.     Pulses: Normal pulses.     Heart sounds: Normal heart sounds. No murmur heard. Pulmonary:     Effort: Pulmonary effort is normal.     Breath sounds: Normal breath sounds. No wheezing, rhonchi or rales.  Abdominal:     General: Abdomen is flat. Bowel sounds are normal. There is no distension.     Palpations: Abdomen is soft.     Tenderness: There is no abdominal tenderness.  Musculoskeletal:        General: No swelling or deformity. Normal range of motion.     Cervical back: Normal range of motion.  Skin:    General: Skin is warm and dry.     Capillary Refill: Capillary refill takes less than 2 seconds.  Neurological:     General: No focal deficit present.     Mental Status: He is alert and oriented to person, place, and time.     Motor: No weakness.  Psychiatric:        Mood and Affect: Mood normal.        Behavior: Behavior normal.        Thought Content: Thought content normal.   Last CBC Lab Results  Component Value Date   WBC 6.9 06/29/2022   HGB 14.3 06/29/2022   HCT 41.4 06/29/2022   MCV 95.6 06/29/2022   MCH 33.0 06/29/2022   RDW 13.2 06/29/2022   PLT 180 0000000   Last metabolic panel Lab Results  Component Value Date   GLUCOSE 100 (H) 06/29/2022   NA 136 06/29/2022   K 3.9 06/29/2022   CL 101 06/29/2022   CO2 26 06/29/2022   BUN 14 06/29/2022   CREATININE 1.13  06/29/2022   GFRNONAA >60 06/29/2022   CALCIUM 9.2 06/29/2022   PROT 8.0 06/29/2022   ALBUMIN 3.9 06/29/2022   LABGLOB 3.6 07/20/2019   AGRATIO 1.3 07/20/2019   BILITOT 0.8 06/29/2022   ALKPHOS 84 06/29/2022   AST 31 06/29/2022   ALT 39 06/29/2022   ANIONGAP 9 06/29/2022   Last lipids Lab Results  Component Value Date   CHOL 217 (H) 06/29/2022   HDL 62 06/29/2022   LDLCALC 118 (H) 06/29/2022   TRIG 186 (H) 06/29/2022   CHOLHDL 3.5 06/29/2022   Last hemoglobin A1c Lab Results  Component Value Date   HGBA1C 5.1 06/29/2022   Last thyroid functions Lab Results  Component Value Date   TSH 3.466 06/29/2022   Last vitamin D Lab Results  Component Value Date   VD25OH 20 (L) 07/28/2016   Last vitamin B12 and Folate Lab Results  Component Value Date   VITAMINB12 1,695 (H) 07/20/2019   FOLATE >20.0 07/20/2019   Assessment & Plan:   Problem List Items Addressed This Visit     Essential hypertension    Currently prescribed amlodipine 10 mg daily and metoprolol tartrate 12.5 mg twice daily.  His initial blood pressure today was 143/88 and 147/87 on repeat. -No medication changes today.  Continue to monitor BP at subsequent appointments.      GERD (gastroesophageal reflux disease)    Reports taking probiotics and a digestive enzyme prior to meals.  He has previously been prescribed Nexium and Protonix.  Symptoms are currently controlled. -No medication changes today      Benign prostatic hyperplasia    Symptoms are currently well-controlled with Flomax 0.4  mg daily -No medication changes today      Hyperlipidemia    Lipid panel updated last month.  Total cholesterol 217 and LDL 118.  He is currently prescribed pravastatin 80 mg daily.  Zetia 10 mg daily was added by his cardiologist in light of last months cholesterol panel.  He has not started Zetia yet.  Previously intolerant of atorvastatin and rosuvastatin due to myalgias and a lower extremity rash. -Continue  pravastatin 80 mg daily -Agree with starting Zetia.      Encounter for general adult medical examination with abnormal findings    Presenting today to establish care.  Previous records and labs been reviewed. -Labs were updated last month -I have recommended he receive outstanding zoster vaccination at his pharmacy -He will return to care for pneumococcal vaccination when he is ready -We will tentatively plan for follow-up in 6 months      Return in about 6 months (around 01/20/2023).   Johnette Abraham, MD

## 2022-07-20 NOTE — Assessment & Plan Note (Signed)
Currently prescribed amlodipine 10 mg daily and metoprolol tartrate 12.5 mg twice daily.  His initial blood pressure today was 143/88 and 147/87 on repeat. -No medication changes today.  Continue to monitor BP at subsequent appointments.

## 2022-07-20 NOTE — Patient Instructions (Signed)
It was a pleasure to see you today.  Thank you for giving [place] the opportunity to be involved in your care.  Below is a brief recap of your visit and next steps.  We will plan to see you again in 6 months.  Summary You have established care today. I recommend getting the shingles vaccine at your pharmacy. We can give your a pneumonia vaccine whenever you would like We will tentatively plan for follow up in 6 months

## 2022-07-20 NOTE — Assessment & Plan Note (Signed)
Lipid panel updated last month.  Total cholesterol 217 and LDL 118.  He is currently prescribed pravastatin 80 mg daily.  Zetia 10 mg daily was added by his cardiologist in light of last months cholesterol panel.  He has not started Zetia yet.  Previously intolerant of atorvastatin and rosuvastatin due to myalgias and a lower extremity rash. -Continue pravastatin 80 mg daily -Agree with starting Zetia.

## 2022-07-20 NOTE — Assessment & Plan Note (Signed)
Presenting today to establish care.  Previous records and labs been reviewed. -Labs were updated last month -I have recommended he receive outstanding zoster vaccination at his pharmacy -He will return to care for pneumococcal vaccination when he is ready -We will tentatively plan for follow-up in 6 months

## 2022-07-20 NOTE — Assessment & Plan Note (Signed)
Reports taking probiotics and a digestive enzyme prior to meals.  He has previously been prescribed Nexium and Protonix.  Symptoms are currently controlled. -No medication changes today

## 2022-09-04 ENCOUNTER — Other Ambulatory Visit: Payer: Self-pay

## 2022-09-04 ENCOUNTER — Telehealth: Payer: Self-pay | Admitting: Internal Medicine

## 2022-09-04 MED ORDER — EZETIMIBE 10 MG PO TABS: 10.0000 mg | ORAL_TABLET | Freq: Every day | ORAL | 1 refills | Status: DC

## 2022-09-04 MED ORDER — AMLODIPINE BESYLATE 10 MG PO TABS: 10.0000 mg | ORAL_TABLET | Freq: Every day | ORAL | 3 refills | Status: DC

## 2022-09-04 MED ORDER — TAMSULOSIN HCL 0.4 MG PO CAPS: 0.4000 mg | ORAL_CAPSULE | Freq: Every day | ORAL | 3 refills | Status: DC

## 2022-09-04 MED ORDER — PRAVASTATIN SODIUM 80 MG PO TABS: 80.0000 mg | ORAL_TABLET | Freq: Every day | ORAL | 1 refills | Status: DC

## 2022-09-04 NOTE — Telephone Encounter (Signed)
Prescription Request  09/04/2022  LOV: 07/20/2022  What is the name of the medication or equipment? amLODipine (NORVASC) 10 MG tablet   tamsulosin (FLOMAX) 0.4 MG CAPS capsule   pravastatin (PRAVACHOL) 80 MG tablet   ezetimibe (ZETIA) 10 MG tablet    Have you contacted your pharmacy to request a refill? No   Which pharmacy would you like this sent to?   [facility] [phone] - [place], [place] - [address] AT [facility] [address] [place] [address] Phone: [phone] Fax: [phone]      Patient notified that their request is being sent to the clinical staff for review and that they should receive a response within 2 business days.   Please advise at Mobile [phone] (mobile)

## 2022-09-04 NOTE — Telephone Encounter (Signed)
Medication sent.

## 2022-09-05 ENCOUNTER — Other Ambulatory Visit: Payer: Self-pay | Admitting: Internal Medicine

## 2022-10-13 ENCOUNTER — Telehealth: Payer: Self-pay | Admitting: Internal Medicine

## 2022-10-13 NOTE — Telephone Encounter (Signed)
Patient aware the records have not been received yet. Patient requested records be sent to [place].

## 2022-10-13 NOTE — Telephone Encounter (Signed)
Has Dr Durwin Nora received any medical records from Dr Antony Haste, MD out of [place]? Call patient to confirm whether or not received records.

## 2022-11-03 ENCOUNTER — Ambulatory Visit (INDEPENDENT_AMBULATORY_CARE_PROVIDER_SITE_OTHER): Payer: Medicare HMO

## 2022-11-03 VITALS — BP 129/73 | Ht 66.5 in | Wt 139.0 lb

## 2022-11-03 DIAGNOSIS — Z0000 Encounter for general adult medical examination without abnormal findings: Secondary | ICD-10-CM

## 2022-11-03 NOTE — Patient Instructions (Signed)
Kyle Rivers , Thank you for taking time to come for your Medicare Wellness Visit. I appreciate your ongoing commitment to your health goals. Please review the following plan we discussed and let me know if I can assist you in the future.   These are the goals we discussed:  Goals      Patient Stated     Patient wants to get better at playing the piano        This is a list of the screening recommended for you and due dates:  Health Maintenance  Topic Date Due   Zoster (Shingles) Vaccine (1 of 2) Never done   Pneumonia Vaccine (1 of 1 - PCV) Never done   COVID-19 Vaccine (4 - 2023-24 season) 01/09/2022   Flu Shot  12/10/2022   Medicare Annual Wellness Visit  11/03/2023   Colon Cancer Screening  12/24/2024   DTaP/Tdap/Td vaccine (2 - Td or Tdap) 07/29/2026   Hepatitis C Screening  Completed   HPV Vaccine  Aged Out    Advanced directives: Advance directive discussed with you today. Even though you declined this today, please call our office should you change your mind, and we can give you the proper paperwork for you to fill out. Advance care planning is a way to make decisions about medical care that fits your values in case you are ever unable to make these decisions for yourself.  Information on Advanced Care Planning can be found at [facility] Advance Health Care Directives Advance Health Care Directives ([email or web address]    Conditions/risks identified: You are due for the following vaccines:Shingles, pneumonia, and covid booster.  You can have these done at your preferred pharmacy. Please have them send [place] documentation of the vaccines given so that we can update your chart.     Next appointment: VIRTUAL/ TELEPHONE VISIT Follow up in one year for your annual wellness visit  December 08, 2023 at 11am   Preventive Care 65 Years and Older, Male  Preventive care refers to lifestyle choices and visits with your health care provider that can promote health and  wellness. What does preventive care include? A yearly physical exam. This is also called an annual well check. Dental exams once or twice a year. Routine eye exams. Ask your health care provider how often you should have your eyes checked. Personal lifestyle choices, including: Daily care of your teeth and gums. Regular physical activity. Eating a healthy diet. Avoiding tobacco and drug use. Limiting alcohol use. Practicing safe sex. Taking low doses of aspirin every day. Taking vitamin and mineral supplements as recommended by your health care provider. What happens during an annual well check? The services and screenings done by your health care provider during your annual well check will depend on your age, overall health, lifestyle risk factors, and family history of disease. Counseling  Your health care provider may ask you questions about your: Alcohol use. Tobacco use. Drug use. Emotional well-being. Home and relationship well-being. Sexual activity. Eating habits. History of falls. Memory and ability to understand (cognition). Work and work Astronomer. Screening  You may have the following tests or measurements: Height, weight, and BMI. Blood pressure. Lipid and cholesterol levels. These may be checked every 5 years, or more frequently if you are over 24 years old. Skin check. Lung cancer screening. You may have this screening every year starting at age 45 if you have a 30-pack-year history of smoking and currently smoke or have quit within the past  15 years. Fecal occult blood test (FOBT) of the stool. You may have this test every year starting at age 78. Flexible sigmoidoscopy or colonoscopy. You may have a sigmoidoscopy every 5 years or a colonoscopy every 10 years starting at age 56. Prostate cancer screening. Recommendations will vary depending on your family history and other risks. Hepatitis C blood test. Hepatitis B blood test. Sexually transmitted disease  (STD) testing. Diabetes screening. This is done by checking your blood sugar (glucose) after you have not eaten for a while (fasting). You may have this done every 1-3 years. Abdominal aortic aneurysm (AAA) screening. You may need this if you are a current or former smoker. Osteoporosis. You may be screened starting at age 74 if you are at high risk. Talk with your health care provider about your test results, treatment options, and if necessary, the need for more tests. Vaccines  Your health care provider may recommend certain vaccines, such as: Influenza vaccine. This is recommended every year. Tetanus, diphtheria, and acellular pertussis (Tdap, Td) vaccine. You may need a Td booster every 10 years. Zoster vaccine. You may need this after age 73. Pneumococcal 13-valent conjugate (PCV13) vaccine. One dose is recommended after age 53. Pneumococcal polysaccharide (PPSV23) vaccine. One dose is recommended after age 27. Talk to your health care provider about which screenings and vaccines you need and how often you need them. This information is not intended to replace advice given to you by your health care provider. Make sure you discuss any questions you have with your health care provider. Document Released: 05/24/2015 Document Revised: 01/15/2016 Document Reviewed: 02/26/2015 Elsevier Interactive Patient Education  2017 [employer].  Fall Prevention in the Home Falls can cause injuries. They can happen to people of all ages. There are many things you can do to make your home safe and to help prevent falls. What can I do on the outside of my home? Regularly fix the edges of walkways and driveways and fix any cracks. Remove anything that might make you trip as you walk through a door, such as a raised step or threshold. Trim any bushes or trees on the path to your home. Use bright outdoor lighting. Clear any walking paths of anything that might make someone trip, such as rocks or  tools. Regularly check to see if handrails are loose or broken. Make sure that both sides of any steps have handrails. Any raised decks and porches should have guardrails on the edges. Have any leaves, snow, or ice cleared regularly. Use sand or salt on walking paths during winter. Clean up any spills in your garage right away. This includes oil or grease spills. What can I do in the bathroom? Use night lights. Install grab bars by the toilet and in the tub and shower. Do not use towel bars as grab bars. Use non-skid mats or decals in the tub or shower. If you need to sit down in the shower, use a plastic, non-slip stool. Keep the floor dry. Clean up any water that spills on the floor as soon as it happens. Remove soap buildup in the tub or shower regularly. Attach bath mats securely with double-sided non-slip rug tape. Do not have throw rugs and other things on the floor that can make you trip. What can I do in the bedroom? Use night lights. Make sure that you have a light by your bed that is easy to reach. Do not use any sheets or blankets that are too big for your  bed. They should not hang down onto the floor. Have a firm chair that has side arms. You can use this for support while you get dressed. Do not have throw rugs and other things on the floor that can make you trip. What can I do in the kitchen? Clean up any spills right away. Avoid walking on wet floors. Keep items that you use a lot in easy-to-reach places. If you need to reach something above you, use a strong step stool that has a grab bar. Keep electrical cords out of the way. Do not use floor polish or wax that makes floors slippery. If you must use wax, use non-skid floor wax. Do not have throw rugs and other things on the floor that can make you trip. What can I do with my stairs? Do not leave any items on the stairs. Make sure that there are handrails on both sides of the stairs and use them. Fix handrails that are  broken or loose. Make sure that handrails are as long as the stairways. Check any carpeting to make sure that it is firmly attached to the stairs. Fix any carpet that is loose or worn. Avoid having throw rugs at the top or bottom of the stairs. If you do have throw rugs, attach them to the floor with carpet tape. Make sure that you have a light switch at the top of the stairs and the bottom of the stairs. If you do not have them, ask someone to add them for you. What else can I do to help prevent falls? Wear shoes that: Do not have high heels. Have rubber bottoms. Are comfortable and fit you well. Are closed at the toe. Do not wear sandals. If you use a stepladder: Make sure that it is fully opened. Do not climb a closed stepladder. Make sure that both sides of the stepladder are locked into place. Ask someone to hold it for you, if possible. Clearly mark and make sure that you can see: Any grab bars or handrails. First and last steps. Where the edge of each step is. Use tools that help you move around (mobility aids) if they are needed. These include: Canes. Walkers. Scooters. Crutches. Turn on the lights when you go into a dark area. Replace any light bulbs as soon as they burn out. Set up your furniture so you have a clear path. Avoid moving your furniture around. If any of your floors are uneven, fix them. If there are any pets around you, be aware of where they are. Review your medicines with your doctor. Some medicines can make you feel dizzy. This can increase your chance of falling. Ask your doctor what other things that you can do to help prevent falls. This information is not intended to replace advice given to you by your health care provider. Make sure you discuss any questions you have with your health care provider. Document Released: 02/21/2009 Document Revised: 10/03/2015 Document Reviewed: 06/01/2014 Elsevier Interactive Patient Education  2017 [employer].

## 2022-11-03 NOTE — Progress Notes (Signed)
 Subjective:   Kyle Rivers is a 67 y.o. male who presents for Medicare Annual/Subsequent preventive examination.  Visit Complete: Virtual  I connected with  Rebeca Alert on 11/03/22 by a audio enabled telemedicine application and verified that I am speaking with the correct person using two identifiers.  Patient Location: Home  Provider Location: Home Office  I discussed the limitations of evaluation and management by telemedicine. The patient expressed understanding and agreed to proceed.  Patient Medicare AWV questionnaire was completed by the patient on n/a; I have confirmed that all information answered by patient is correct and no changes since this date.  Review of Systems     Cardiac Risk Factors include: advanced age (>74men, >90 women);dyslipidemia;hypertension;male gender     Objective:    Today's Vitals   11/03/22 1335  BP: 129/73  Weight: 139 lb (63 kg)  Height: 5' 6.5" (1.689 m)   Body mass index is 22.1 kg/m.     11/03/2022    1:41 PM 12/24/2021    6:45 AM 06/23/2019    9:47 AM 03/10/2018    5:36 PM 10/02/2016   12:25 PM 08/12/2016   10:29 AM  Advanced Directives  Does Patient Have a Medical Advance Directive? No No No No No No  Would patient like information on creating a medical advance directive? No - Patient declined No - Patient declined No - Patient declined No - Patient declined No - Patient declined No - Patient declined    Current Medications (verified) Outpatient Encounter Medications as of 11/03/2022  Medication Sig   amLODipine (NORVASC) 10 MG tablet Take 1 tablet (10 mg total) by mouth daily.   esomeprazole (NEXIUM) 20 MG capsule Take 20 mg by mouth daily at 12 noon.   ezetimibe (ZETIA) 10 MG tablet Take 1 tablet (10 mg total) by mouth daily.   fenofibrate (TRICOR) 48 MG tablet TAKE 1 TABLET BY MOUTH  DAILY   fish oil-omega-3 fatty acids 1000 MG capsule Take 2 g by mouth daily.    Menthol-Methyl Salicylate (MUSCLE RUB) 10-15 % CREA  Apply 1 application topically as needed for muscle pain.   metoprolol tartrate (LOPRESSOR) 25 MG tablet Take 0.5 tablets (12.5 mg total) by mouth 2 (two) times daily.   OVER THE COUNTER MEDICATION See admin instructions. Genius brand Digestion Opitimizer. Take 3-4 capsules daily.   pantoprazole (PROTONIX) 40 MG tablet TAKE 1 TABLET BY MOUTH  DAILY   pravastatin (PRAVACHOL) 80 MG tablet Take 1 tablet (80 mg total) by mouth daily.   Probiotic Product (ACIDOPHILUS/BIFIDUS PO) Take 1 capsule by mouth daily.   tamsulosin (FLOMAX) 0.4 MG CAPS capsule Take 1 capsule (0.4 mg total) by mouth daily.   TURMERIC PO Take 1,000 mg by mouth daily.    No facility-administered encounter medications on file as of 11/03/2022.    Allergies (verified) Atorvastatin and Oxycodone   History: Past Medical History:  Diagnosis Date   BPH (benign prostatic hypertrophy)    DJD (degenerative joint disease)    in lower back    Ear infection 08/12/2016   pt believes he may be coming down with an ear infection today   Hemorrhoids    Hyperlipidemia    Hypertension    LVH (left ventricular hypertrophy)    LVH variant, EKG show ST elevation, normal cardiac catherization 2009   Prostatitis    Past Surgical History:  Procedure Laterality Date   ANTERIOR CERVICAL DECOMP/DISCECTOMY FUSION N/A 08/12/2016   Procedure: C5-6, C6-7 ANTERIOR CERVICAL DECOMPRESSION/DISCECTOMY  FUSION 2 LEVELS, ALLOGRAFT, PLATE;  Surgeon: Eldred Manges, MD;  Location: [facility] OR;  Service: Orthopedics;  Laterality: N/A;   CARDIAC CATHETERIZATION     CERVICAL DISC SURGERY  08/13/2015   c 5 c6 c7   COLONOSCOPY     COLONOSCOPY N/A 10/02/2016   Procedure: COLONOSCOPY;  Surgeon: Malissa Hippo, MD;  Location: [facility];  Service: Endoscopy;  Laterality: N/A;  12:45   COLONOSCOPY WITH PROPOFOL N/A 12/24/2021   Procedure: COLONOSCOPY WITH PROPOFOL;  Surgeon: Dolores Frame, MD;  Location: [facility];  Service: Gastroenterology;   Laterality: N/A;  730   dental inplants     HEMORRHOID SURGERY     POLYPECTOMY  10/02/2016   Procedure: POLYPECTOMY;  Surgeon: Malissa Hippo, MD;  Location: [facility];  Service: Endoscopy;;  colon   POLYPECTOMY  12/24/2021   Procedure: POLYPECTOMY;  Surgeon: Dolores Frame, MD;  Location: [facility];  Service: Gastroenterology;;   UPPER GASTROINTESTINAL ENDOSCOPY     Family History  Problem Relation Age of Onset   Stroke Mother    Hypertension Father    Diabetes Father    Prostate cancer Father    Diabetes Sister    Diabetes Sister    Social History   Socioeconomic History   Marital status: Married    Spouse name: Not on file   Number of children: Not on file   Years of education: Not on file   Highest education level: Not on file  Occupational History   Not on file  Tobacco Use   Smoking status: Former    Packs/day: 0.50    Years: 30.00    Additional pack years: 0.00    Total pack years: 15.00    Types: Cigarettes    Passive exposure: Never   Smokeless tobacco: Former   Tobacco comments:    smokes light, somedays none  Vaping Use   Vaping Use: Never used  Substance and Sexual Activity   Alcohol use: Yes    Comment: Occ   Drug use: No   Sexual activity: Yes  Other Topics Concern   Not on file  Social History Narrative   Not on file   Social Determinants of Health   Financial Resource Strain: Low Risk  (11/03/2022)   Overall Financial Resource Strain (CARDIA)    Difficulty of Paying Living Expenses: Not hard at all  Food Insecurity: No Food Insecurity (11/03/2022)   Hunger Vital Sign    Worried About Running Out of Food in the Last Year: Never true    Ran Out of Food in the Last Year: Never true  Transportation Needs: No Transportation Needs (11/03/2022)   PRAPARE - Administrator, Civil Service (Medical): No    Lack of Transportation (Non-Medical): No  Physical Activity: Sufficiently Active (11/03/2022)   Exercise Vital Sign     Days of Exercise per Week: 7 days    Minutes of Exercise per Session: 30 min  Stress: No Stress Concern Present (11/03/2022)   [employer] of Occupational Health - Occupational Stress Questionnaire    Feeling of Stress : Not at all  Social Connections: Moderately Integrated (11/03/2022)   Social Connection and Isolation Panel [NHANES]    Frequency of Communication with Friends and Family: More than three times a week    Frequency of Social Gatherings with Friends and Family: More than three times a week    Attends Religious Services: 1 to 4 times per year  Active Member of Clubs or Organizations: No    Attends Banker Meetings: Never    Marital Status: Married    Tobacco Counseling Counseling given: Yes Tobacco comments: smokes light, somedays none   Clinical Intake:  Pre-visit preparation completed: Yes  Pain : No/denies pain     BMI - recorded: 22.1 Nutritional Status: BMI of 19-24  Normal Nutritional Risks: None Diabetes: No  How often do you need to have someone help you when you read instructions, pamphlets, or other written materials from your doctor or pharmacy?: 1 - Never  Interpreter Needed?: No  Information entered by ::  Emmary Culbreath, CMA   Activities of Daily Living    11/03/2022    1:37 PM  In your present state of health, do you have any difficulty performing the following activities:  Hearing? 0  Vision? 0  Difficulty concentrating or making decisions? 0  Walking or climbing stairs? 0  Dressing or bathing? 0  Doing errands, shopping? 0  Preparing Food and eating ? N  Using the Toilet? N  In the past six months, have you accidently leaked urine? N  Do you have problems with loss of bowel control? N  Managing your Medications? N  Managing your Finances? N  Housekeeping or managing your Housekeeping? N    Patient Care Team: Billie Lade, MD as PCP - General (Internal Medicine) Wyline Mood Dorothe Pea, MD as PCP - Cardiology  (Cardiology)  Indicate any recent Medical Services you may have received from other than [facility] providers in the past year (date may be approximate).     Assessment:   This is a routine wellness examination for Kyle Rivers.  Hearing/Vision screen Hearing Screening - Comments:: Patient denies any hearing difficulties.    Dietary issues and exercise activities discussed:     Goals Addressed             This Visit's Progress    Patient Stated       Patient wants to get better at playing the piano       Depression Screen    11/03/2022    1:38 PM 07/20/2022   11:44 AM 06/17/2020   12:39 PM 04/21/2019    2:11 PM 03/10/2018    4:08 PM 11/08/2017    3:47 PM 07/28/2016   11:03 AM  PHQ 2/9 Scores  PHQ - 2 Score 0 0 0 0 0 0 0  PHQ- 9 Score  0     0    Fall Risk    11/03/2022    1:42 PM 07/20/2022   11:44 AM 06/17/2020   12:39 PM 04/21/2019    2:11 PM 11/08/2017    3:47 PM  Fall Risk   Falls in the past year? 0 0 0 0 No  Number falls in past yr: 0 0     Injury with Fall? 0 0     Risk for fall due to : No Fall Risks No Fall Risks No Fall Risks    Follow up Falls prevention discussed Falls evaluation completed Falls evaluation completed Falls evaluation completed     MEDICARE RISK AT HOME:  Medicare Risk at Home - 11/03/22 1342     Any stairs in or around the home? Yes    If so, are there any without handrails? No    Home free of loose throw rugs in walkways, pet beds, electrical cords, etc? Yes    Adequate lighting in your home to reduce risk of falls? Yes  Life alert? No    Use of a cane, walker or w/c? No    Grab bars in the bathroom? Yes    Shower chair or bench in shower? No    Elevated toilet seat or a handicapped toilet? No             TIMED UP AND GO:  Was the test performed?  No    Cognitive Function:        11/03/2022    1:42 PM  6CIT Screen  What Year? 0 points  What month? 0 points  What time? 0 points  Count back from 20 0 points  Months in  reverse 0 points  Repeat phrase 0 points  Total Score 0 points    Immunizations Immunization History  Administered Date(s) Administered   Influenza,inj,Quad PF,6+ Mos 04/02/2014, 04/21/2019   Moderna Sars-Covid-2 Vaccination 07/17/2019, 08/15/2019, 04/25/2020   Tdap 07/28/2016    TDAP status: Up to date  Flu Vaccine status: Up to date  Pneumococcal vaccine status: Due, Education has been provided regarding the importance of this vaccine. Advised may receive this vaccine at local pharmacy or Health Dept. Aware to provide a copy of the vaccination record if obtained from local pharmacy or Health Dept. Verbalized acceptance and understanding.  Covid-19 vaccine status: Information provided on how to obtain vaccines.   Qualifies for Shingles Vaccine? Yes   Zostavax completed No   Shingrix Completed?: No.    Education has been provided regarding the importance of this vaccine. Patient has been advised to call insurance company to determine out of pocket expense if they have not yet received this vaccine. Advised may also receive vaccine at local pharmacy or Health Dept. Verbalized acceptance and understanding.  Screening Tests Health Maintenance  Topic Date Due   Medicare Annual Wellness (AWV)  Never done   Zoster Vaccines- Shingrix (1 of 2) Never done   Pneumonia Vaccine 72+ Years old (1 of 1 - PCV) Never done   COVID-19 Vaccine (4 - 2023-24 season) 01/09/2022   INFLUENZA VACCINE  12/10/2022   Colonoscopy  12/24/2024   DTaP/Tdap/Td (2 - Td or Tdap) 07/29/2026   Hepatitis C Screening  Completed   HPV VACCINES  Aged Out    Health Maintenance  Health Maintenance Due  Topic Date Due   Medicare Annual Wellness (AWV)  Never done   Zoster Vaccines- Shingrix (1 of 2) Never done   Pneumonia Vaccine 59+ Years old (1 of 1 - PCV) Never done   COVID-19 Vaccine (4 - 2023-24 season) 01/09/2022    Colorectal cancer screening: Type of screening: Colonoscopy. Completed 12/24/2021. Repeat  every 3 years  Lung Cancer Screening: (Low Dose CT Chest recommended if Age 43-80 years, 20 pack-year currently smoking OR have quit w/in 15years.) does not qualify.   Lung Cancer Screening Referral: na  Additional Screening:  Hepatitis C Screening: does not qualify; Completed 07/28/2016  Vision Screening: Recommended annual ophthalmology exams for early detection of glaucoma and other disorders of the eye. Is the patient up to date with their annual eye exam?  Yes  Who is the provider or what is the name of the office in which the patient attends annual eye exams? Doesn't have a set provider If pt is not established with a provider, would they like to be referred to a provider to establish care? No .   Dental Screening: Recommended annual dental exams for proper oral hygiene  Diabetic Foot Exam: n/a   Community Resource Referral / Chronic  Care Management: CRR required this visit?  No   CCM required this visit?  No     Plan:     I have personally reviewed and noted the following in the patient's chart:   Medical and social history Use of alcohol, tobacco or illicit drugs  Current medications and supplements including opioid prescriptions. Patient is not currently taking opioid prescriptions. Functional ability and status Nutritional status Physical activity Advanced directives List of other physicians Hospitalizations, surgeries, and ER visits in previous 12 months Vitals Screenings to include cognitive, depression, and falls Referrals and appointments  In addition, I have reviewed and discussed with patient certain preventive protocols, quality metrics, and best practice recommendations. A written personalized care plan for preventive services as well as general preventive health recommendations were provided to patient.   Patient states he couldn't name the medications he was taking but states he was taking blood pressure and cholesterol meds. Per patient list in his  chart is correct.  Because this visit was a virtual/telehealth visit,  certain criteria was not obtained, such a blood pressure, CBG if patient is a diabetic, and timed up and go.    Jordan Hawks Odaliz Mcqueary, CMA   11/03/2022   After Visit Summary: (MyChart) Due to this being a telephonic visit, the after visit summary with patients personalized plan was offered to patient via MyChart   Nurse Notes:

## 2022-11-13 ENCOUNTER — Other Ambulatory Visit: Payer: Self-pay | Admitting: Internal Medicine

## 2022-12-23 ENCOUNTER — Telehealth: Payer: Self-pay | Admitting: Internal Medicine

## 2022-12-23 MED ORDER — DICLOFENAC SODIUM 75 MG PO TBEC: 75.0000 mg | DELAYED_RELEASE_TABLET | Freq: Two times a day (BID) | ORAL | 0 refills | Status: DC | PRN

## 2022-12-23 NOTE — Telephone Encounter (Signed)
Prescription Request  12/23/2022  LOV: 07/20/2022  What is the name of the medication or equipment? DICLOFENAC SODIUM 75mg   Have you contacted your pharmacy to request a refill? Yes   Which pharmacy would you like this sent to?   [facility] [phone] - [place], [place] - [address] AT [facility] [address] [place] [address] Phone: [phone] Fax: [phone]      Patient notified that their request is being sent to the clinical staff for review and that they should receive a response within 2 business days.   Please advise at Mobile [phone] (mobile)

## 2022-12-23 NOTE — Telephone Encounter (Signed)
Patient aware.

## 2023-01-13 ENCOUNTER — Other Ambulatory Visit: Payer: Self-pay | Admitting: Internal Medicine

## 2023-01-14 ENCOUNTER — Telehealth: Payer: Self-pay | Admitting: Internal Medicine

## 2023-01-14 NOTE — Telephone Encounter (Signed)
Patient called in wants to see if provider can up the quantity for diclofenac (VOLTAREN) 75 MG EC tablet [[id number]]    Patient is taking med 2x daily and 30 tabs only last 2 weeks . Patietn wants a call back wants to see if can get monthly doses

## 2023-01-14 NOTE — Telephone Encounter (Signed)
Patient aware.

## 2023-01-21 ENCOUNTER — Ambulatory Visit: Payer: Medicare HMO | Admitting: Internal Medicine

## 2023-01-25 ENCOUNTER — Ambulatory Visit (INDEPENDENT_AMBULATORY_CARE_PROVIDER_SITE_OTHER): Payer: Medicare HMO | Admitting: Internal Medicine

## 2023-01-25 ENCOUNTER — Encounter: Payer: Self-pay | Admitting: Internal Medicine

## 2023-01-25 VITALS — BP 136/72 | HR 83 | Ht 67.0 in | Wt 129.4 lb

## 2023-01-25 DIAGNOSIS — M5416 Radiculopathy, lumbar region: Secondary | ICD-10-CM | POA: Diagnosis not present

## 2023-01-25 DIAGNOSIS — I10 Essential (primary) hypertension: Secondary | ICD-10-CM

## 2023-01-25 DIAGNOSIS — K219 Gastro-esophageal reflux disease without esophagitis: Secondary | ICD-10-CM | POA: Diagnosis not present

## 2023-01-25 MED ORDER — PREDNISONE 10 MG (21) PO TBPK: ORAL_TABLET | ORAL | 0 refills | Status: DC

## 2023-01-25 MED ORDER — CYCLOBENZAPRINE HCL 5 MG PO TABS: 5.0000 mg | ORAL_TABLET | Freq: Two times a day (BID) | ORAL | 1 refills | Status: AC | PRN

## 2023-01-25 NOTE — Assessment & Plan Note (Signed)
Continue Pantoprazole

## 2023-01-25 NOTE — Assessment & Plan Note (Signed)
BP Readings from Last 1 Encounters:  01/25/23 136/72   Well-controlled with amlodipine 10 mg QD and metoprolol BP was initially elevated, but improved later - advised that his BP can run higher while taking Prednisone, contact if BP > 160/100 consistently Counseled for compliance with the medications Advised DASH diet and moderate exercise/walking, at least 150 mins/week

## 2023-01-25 NOTE — Progress Notes (Signed)
Acute Office Visit  Subjective:    Patient ID: Kyle Rivers, male    DOB: Mar 08, 1956, 67 y.o.   MRN: [id number]  Chief Complaint  Patient presents with   Back Pain    Patient is having back and hip pains, having issues standing and sitting     HPI Patient is in today for complaint of acute on chronic low back pain, radiating to right buttock and RLE.  He has history of lumbar spondylosis, most prominent at L5-S1.  He reports that he was mowing grass to 2 weeks ago and tried to lift his mower from a ditch, and has been having back pain since then.  His pain is constant, worse with prolonged standing and sitting, better with bending forward.  He had been taking diclofenac tablets twice daily, but has started taking Tylenol in status since last week as advised on phone by [place].  He has burning pain and intermittent numbness in the right foot as well.  Denies any saddle anesthesia, urinary or stool incontinence.  Past Medical History:  Diagnosis Date   BPH (benign prostatic hypertrophy)    DJD (degenerative joint disease)    in lower back    Ear infection 08/12/2016   pt believes he may be coming down with an ear infection today   Hemorrhoids    Hyperlipidemia    Hypertension    LVH (left ventricular hypertrophy)    LVH variant, EKG show ST elevation, normal cardiac catherization 2009   Prostatitis     Past Surgical History:  Procedure Laterality Date   ANTERIOR CERVICAL DECOMP/DISCECTOMY FUSION N/A 08/12/2016   Procedure: C5-6, C6-7 ANTERIOR CERVICAL DECOMPRESSION/DISCECTOMY FUSION 2 LEVELS, ALLOGRAFT, PLATE;  Surgeon: Eldred Manges, MD;  Location: [facility];  Service: Orthopedics;  Laterality: N/A;   CARDIAC CATHETERIZATION     CERVICAL DISC SURGERY  08/13/2015   c 5 c6 c7   COLONOSCOPY     COLONOSCOPY N/A 10/02/2016   Procedure: COLONOSCOPY;  Surgeon: Malissa Hippo, MD;  Location: [facility];  Service: Endoscopy;  Laterality: N/A;  12:45   COLONOSCOPY WITH PROPOFOL N/A  12/24/2021   Procedure: COLONOSCOPY WITH PROPOFOL;  Surgeon: Dolores Frame, MD;  Location: [facility];  Service: Gastroenterology;  Laterality: N/A;  730   dental inplants     HEMORRHOID SURGERY     POLYPECTOMY  10/02/2016   Procedure: POLYPECTOMY;  Surgeon: Malissa Hippo, MD;  Location: [facility];  Service: Endoscopy;;  colon   POLYPECTOMY  12/24/2021   Procedure: POLYPECTOMY;  Surgeon: Dolores Frame, MD;  [facility]: [facility];  Service: Gastroenterology;;   UPPER GASTROINTESTINAL ENDOSCOPY      Family History  Problem Relation Age of Onset   Stroke Mother    Hypertension Father    Diabetes Father    Prostate cancer Father    Diabetes Sister    Diabetes Sister     Social History   Socioeconomic History   Marital status: Married    Spouse name: Not on file   Number of children: Not on file   Years of education: Not on file   Highest education level: Not on file  Occupational History   Not on file  Tobacco Use   Smoking status: Former    Current packs/day: 0.50    Average packs/day: 0.5 packs/day for 30.0 years (15.0 ttl pk-yrs)    Types: Cigarettes    Passive exposure: Never   Smokeless tobacco: Former  Tobacco comments:    smokes light, somedays none  Vaping Use   Vaping status: Never Used  Substance and Sexual Activity   Alcohol use: Yes    Comment: Occ   Drug use: No   Sexual activity: Yes  Other Topics Concern   Not on file  Social History Narrative   Not on file   Social Determinants of Health   Financial Resource Strain: Low Risk  (11/03/2022)   Overall Financial Resource Strain (CARDIA)    Difficulty of Paying Living Expenses: Not hard at all  Food Insecurity: No Food Insecurity (11/03/2022)   Hunger Vital Sign    Worried About Running Out of Food in the Last Year: Never true    Ran Out of Food in the Last Year: Never true  Transportation Needs: No Transportation Needs (11/03/2022)   PRAPARE - Doctor, general practice (Medical): No    Lack of Transportation (Non-Medical): No  Physical Activity: Sufficiently Active (11/03/2022)   Exercise Vital Sign    Days of Exercise per Week: 7 days    Minutes of Exercise per Session: 30 min  Stress: No Stress Concern Present (11/03/2022)   [employer] of Occupational Health - Occupational Stress Questionnaire    Feeling of Stress : Not at all  Social Connections: Moderately Integrated (11/03/2022)   Social Connection and Isolation Panel [NHANES]    Frequency of Communication with Friends and Family: More than three times a week    Frequency of Social Gatherings with Friends and Family: More than three times a week    Attends Religious Services: 1 to 4 times per year    Active Member of [employer] or Organizations: No    Attends Banker Meetings: Never    Marital Status: Married  Catering manager Violence: Not At Risk (11/03/2022)   Humiliation, Afraid, Rape, and Kick questionnaire    Fear of Current or Ex-Partner: No    Emotionally Abused: No    Physically Abused: No    Sexually Abused: No    Outpatient Medications Prior to Visit  Medication Sig Dispense Refill   amLODipine (NORVASC) 10 MG tablet Take 1 tablet (10 mg total) by mouth daily. 90 tablet 3   diclofenac (VOLTAREN) 75 MG EC tablet TAKE 1 TABLET(75 MG) BY MOUTH TWICE DAILY AS NEEDED FOR MODERATE PAIN 30 tablet 0   esomeprazole (NEXIUM) 20 MG capsule Take 20 mg by mouth daily at 12 noon.     ezetimibe (ZETIA) 10 MG tablet Take 1 tablet (10 mg total) by mouth daily. 30 tablet 1   fenofibrate (TRICOR) 48 MG tablet TAKE 1 TABLET BY MOUTH  DAILY 90 tablet 3   fish oil-omega-3 fatty acids 1000 MG capsule Take 2 g by mouth daily.      Menthol-Methyl Salicylate (MUSCLE RUB) 10-15 % CREA Apply 1 application topically as needed for muscle pain.     metoprolol tartrate (LOPRESSOR) 25 MG tablet Take 0.5 tablets (12.5 mg total) by mouth 2 (two) times daily.     OVER THE COUNTER  MEDICATION See admin instructions. Genius brand Digestion Opitimizer. Take 3-4 capsules daily.     pantoprazole (PROTONIX) 40 MG tablet TAKE 1 TABLET BY MOUTH  DAILY 90 tablet 3   pravastatin (PRAVACHOL) 80 MG tablet TAKE 1 TABLET(80 MG) BY MOUTH DAILY 30 tablet 1   Probiotic Product (ACIDOPHILUS/BIFIDUS PO) Take 1 capsule by mouth daily.     tamsulosin (FLOMAX) 0.4 MG CAPS capsule Take 1 capsule (0.4  mg total) by mouth daily. 90 capsule 3   TURMERIC PO Take 1,000 mg by mouth daily.      No facility-administered medications prior to visit.    Allergies  Allergen Reactions   Atorvastatin Other (See Comments) and Rash    "Bumps showed up on skin "   Oxycodone Nausea Only and Other (See Comments)    " makes me sick with nausea "    Review of Systems  Constitutional:  Negative for chills and fever.  HENT:  Negative for congestion and sore throat.   Eyes:  Negative for pain and discharge.  Respiratory:  Negative for cough and shortness of breath.   Cardiovascular:  Negative for chest pain and palpitations.  Gastrointestinal:  Negative for diarrhea, nausea and vomiting.  Endocrine: Negative for polydipsia and polyuria.  Genitourinary:  Negative for dysuria and hematuria.  Musculoskeletal:  Positive for arthralgias (R hip) and back pain. Negative for neck pain and neck stiffness.  Skin:  Negative for rash.  Neurological:  Positive for numbness. Negative for dizziness, weakness and headaches.  Psychiatric/Behavioral:  Negative for agitation and behavioral problems.        Objective:    Physical Exam Vitals reviewed.  Constitutional:      General: He is not in acute distress.    Appearance: He is not diaphoretic.  HENT:     Head: Normocephalic and atraumatic.  Eyes:     General: No scleral icterus.    Extraocular Movements: Extraocular movements intact.  Cardiovascular:     Rate and Rhythm: Normal rate and regular rhythm.     Heart sounds: Normal heart sounds. No murmur  heard. Pulmonary:     Breath sounds: Normal breath sounds. No wheezing or rales.  Musculoskeletal:     Cervical back: Neck supple. No tenderness.     Lumbar back: Tenderness (Right paraspinal) present. Positive right straight leg raise test and positive left straight leg raise test.     Right lower leg: No edema.     Left lower leg: No edema.  Skin:    General: Skin is warm.     Findings: No rash.  Neurological:     General: No focal deficit present.     Mental Status: He is alert and oriented to person, place, and time.  Psychiatric:        Mood and Affect: Mood normal.        Behavior: Behavior normal.     BP 136/72 (BP Location: Left Arm)   Pulse 83   Ht 5\' 7"  (1.702 m)   Wt 129 lb 6.4 oz (58.7 kg)   SpO2 96%   BMI 20.27 kg/m  Wt Readings from Last 3 Encounters:  01/25/23 129 lb 6.4 oz (58.7 kg)  11/03/22 139 lb (63 kg)  07/20/22 135 lb 9.6 oz (61.5 kg)        Assessment & Plan:   Problem List Items Addressed This Visit       Cardiovascular and Mediastinum   Essential hypertension    BP Readings from Last 1 Encounters:  01/25/23 136/72   Well-controlled with amlodipine 10 mg QD and metoprolol BP was initially elevated, but improved later - advised that his BP can run higher while taking Prednisone, contact if BP > 160/100 consistently Counseled for compliance with the medications Advised DASH diet and moderate exercise/walking, at least 150 mins/week         Digestive   GERD (gastroesophageal reflux disease)    Continue Pantoprazole  Nervous and Auditory   Lumbar radiculopathy - Primary    His symptoms are concerning for lumbar spinal stenosis Acute pain likely due to muscular strain recently Started Sterapred taper Avoid diclofenac while taking prednisone, can take Tylenol as needed for pain Flexeril as needed for muscle spasms Advised to apply heating pad and/or back brace Avoid heavy lifting Simple back exercises material  provided Referred to PT If persistent, will need MRI of lumbar spine and/or spine surgery evaluation       Relevant Medications   predniSONE (STERAPRED UNI-PAK 21 TAB) 10 MG (21) TBPK tablet   cyclobenzaprine (FLEXERIL) 5 MG tablet   Other Relevant Orders   DG Lumbar Spine Complete     Meds ordered this encounter  Medications   predniSONE (STERAPRED UNI-PAK 21 TAB) 10 MG (21) TBPK tablet    Sig: Take as package instructions.    Dispense:  1 each    Refill:  0   cyclobenzaprine (FLEXERIL) 5 MG tablet    Sig: Take 1 tablet (5 mg total) by mouth 2 (two) times daily as needed for muscle spasms.    Dispense:  30 tablet    Refill:  1     Jaleisa Brose Concha Se, MD

## 2023-01-25 NOTE — Assessment & Plan Note (Signed)
His symptoms are concerning for lumbar spinal stenosis Acute pain likely due to muscular strain recently Started Sterapred taper Avoid diclofenac while taking prednisone, can take Tylenol as needed for pain Flexeril as needed for muscle spasms Advised to apply heating pad and/or back brace Avoid heavy lifting Simple back exercises material provided Referred to PT If persistent, will need MRI of lumbar spine and/or spine surgery evaluation

## 2023-01-25 NOTE — Patient Instructions (Signed)
Please start taking Prednisone as prescribed.  Please take Flexeril as needed for muscle spasms.  Okay to apply heating pad and/or back brace.  Avoid heavy lifting and frequent bending.

## 2023-01-27 ENCOUNTER — Ambulatory Visit (HOSPITAL_COMMUNITY)
Admission: RE | Admit: 2023-01-27 | Discharge: 2023-01-27 | Disposition: A | Discharge status: Home / Self Care | Payer: Medicare HMO | Source: Ambulatory Visit | Attending: Internal Medicine | Admitting: Internal Medicine

## 2023-01-27 DIAGNOSIS — M5416 Radiculopathy, lumbar region: Secondary | ICD-10-CM | POA: Insufficient documentation

## 2023-01-28 ENCOUNTER — Encounter: Payer: Self-pay | Admitting: Internal Medicine

## 2023-01-28 ENCOUNTER — Ambulatory Visit (INDEPENDENT_AMBULATORY_CARE_PROVIDER_SITE_OTHER): Payer: Medicare HMO | Admitting: Internal Medicine

## 2023-01-28 VITALS — BP 137/78 | HR 55 | Ht 67.0 in | Wt 133.8 lb

## 2023-01-28 DIAGNOSIS — I10 Essential (primary) hypertension: Secondary | ICD-10-CM

## 2023-01-28 DIAGNOSIS — E559 Vitamin D deficiency, unspecified: Secondary | ICD-10-CM

## 2023-01-28 DIAGNOSIS — Z8639 Personal history of other endocrine, nutritional and metabolic disease: Secondary | ICD-10-CM

## 2023-01-28 DIAGNOSIS — E782 Mixed hyperlipidemia: Secondary | ICD-10-CM

## 2023-01-28 DIAGNOSIS — M5416 Radiculopathy, lumbar region: Secondary | ICD-10-CM

## 2023-01-28 DIAGNOSIS — Z23 Encounter for immunization: Secondary | ICD-10-CM | POA: Diagnosis not present

## 2023-01-28 NOTE — Patient Instructions (Signed)
It was a pleasure to see you today.  Thank you for giving [place] the opportunity to be involved in your care.  Below is a brief recap of your visit and next steps.  We will plan to see you again in 6 months.   Summary No medication changes today Completes prednisone pack as recently prescribed and continue home PT exercises Repeat cholesterol panel and vitamin D level today Flu shot administered Follow up in 6 months

## 2023-01-28 NOTE — Progress Notes (Signed)
Established Patient Office Visit  Subjective   Patient ID: Kyle Rivers, male    DOB: 10-Feb-1956  Age: 67 y.o. MRN: [id number]  Chief Complaint  Patient presents with   Hypertension    Six month follow up    Kyle Rivers returns to care today for routine follow-up.  He was last evaluated by me on 3/11 as a new patient presenting to establish care.  Zetia was added for improved treatment of hyperlipidemia.  No additional medication changes were made and 44-month follow-up was arranged.  Most recently, he presented to [facility] for an acute visit on 9/16 endorsing a 2-week history of lumbar back pain.  Treated with prednisone taper and Flexeril.  X-rays of the lumbar spine were ordered and he was referred to physical therapy.  Today Kyle Rivers reports feeling fairly well.  He states that his back pain is improving.  He does not have any concerns to discuss today.  Past Medical History:  Diagnosis Date   BPH (benign prostatic hypertrophy)    DJD (degenerative joint disease)    in lower back    Ear infection 08/12/2016   pt believes he may be coming down with an ear infection today   Hemorrhoids    Hyperlipidemia    Hypertension    LVH (left ventricular hypertrophy)    LVH variant, EKG show ST elevation, normal cardiac catherization 2009   Prostatitis    Past Surgical History:  Procedure Laterality Date   ANTERIOR CERVICAL DECOMP/DISCECTOMY FUSION N/A 08/12/2016   Procedure: C5-6, C6-7 ANTERIOR CERVICAL DECOMPRESSION/DISCECTOMY FUSION 2 LEVELS, ALLOGRAFT, PLATE;  Surgeon: Eldred Manges, MD;  Location: [facility];  Service: Orthopedics;  Laterality: N/A;   CARDIAC CATHETERIZATION     CERVICAL DISC SURGERY  08/13/2015   c 5 c6 c7   COLONOSCOPY     COLONOSCOPY N/A 10/02/2016   Procedure: COLONOSCOPY;  Surgeon: Malissa Hippo, MD;  [facility]: [facility];  Service: Endoscopy;  Laterality: N/A;  12:45   COLONOSCOPY WITH PROPOFOL N/A 12/24/2021   Procedure: COLONOSCOPY WITH PROPOFOL;  Surgeon:  Dolores Frame, MD;  Location: [facility];  Service: Gastroenterology;  Laterality: N/A;  730   dental inplants     HEMORRHOID SURGERY     POLYPECTOMY  10/02/2016   Procedure: POLYPECTOMY;  Surgeon: Malissa Hippo, MD;  [facility]: [facility];  Service: Endoscopy;;  colon   POLYPECTOMY  12/24/2021   Procedure: POLYPECTOMY;  Surgeon: Dolores Frame, MD;  [facility]: [facility];  Service: Gastroenterology;;   UPPER GASTROINTESTINAL ENDOSCOPY     Social History   Tobacco Use   Smoking status: Former    Current packs/day: 0.50    Average packs/day: 0.5 packs/day for 30.0 years (15.0 ttl pk-yrs)    Types: Cigarettes    Passive exposure: Never   Smokeless tobacco: Former   Tobacco comments:    smokes light, somedays none  Vaping Use   Vaping status: Never Used  Substance Use Topics   Alcohol use: Yes    Comment: Occ   Drug use: No   Family History  Problem Relation Age of Onset   Stroke Mother    Hypertension Father    Diabetes Father    Prostate cancer Father    Diabetes Sister    Diabetes Sister    Allergies  Allergen Reactions   Atorvastatin Other (See Comments) and Rash    "Bumps showed up on skin "   Oxycodone Nausea Only and Other (  See Comments)    " makes me sick with nausea "   Review of Systems  Musculoskeletal:  Positive for back pain (Lumbar back pain-resolving).     Objective:     BP 137/78 (BP Location: Right Arm, Patient Position: Sitting, Cuff Size: Normal)   Pulse (!) 55   Ht 5\' 7"  (1.702 m)   Wt 133 lb 12.8 oz (60.7 kg)   SpO2 97%   BMI 20.96 kg/m  BP Readings from Last 3 Encounters:  01/28/23 137/78  01/25/23 136/72  11/03/22 129/73   Physical Exam Vitals reviewed.  Constitutional:      General: He is not in acute distress.    Appearance: Normal appearance. He is not ill-appearing.  HENT:     Head: Normocephalic and atraumatic.     Right Ear: External ear normal.     Left Ear: External ear normal.     Nose:  Nose normal. No congestion or rhinorrhea.     Mouth/Throat:     Mouth: Mucous membranes are moist.     Pharynx: Oropharynx is clear.  Eyes:     General: No scleral icterus.    Extraocular Movements: Extraocular movements intact.     Conjunctiva/sclera: Conjunctivae normal.     Pupils: Pupils are equal, round, and reactive to light.  Cardiovascular:     Rate and Rhythm: Normal rate and regular rhythm.     Pulses: Normal pulses.     Heart sounds: Normal heart sounds. No murmur heard. Pulmonary:     Effort: Pulmonary effort is normal.     Breath sounds: Normal breath sounds. No wheezing, rhonchi or rales.  Abdominal:     General: Abdomen is flat. Bowel sounds are normal. There is no distension.     Palpations: Abdomen is soft.     Tenderness: There is no abdominal tenderness.  Musculoskeletal:        General: Tenderness (TTP over the right paraspinal muscles of the lumbar spine) present. No swelling or deformity. Normal range of motion.     Cervical back: Normal range of motion.  Skin:    General: Skin is warm and dry.     Capillary Refill: Capillary refill takes less than 2 seconds.  Neurological:     General: No focal deficit present.     Mental Status: He is alert and oriented to person, place, and time.     Motor: No weakness.  Psychiatric:        Mood and Affect: Mood normal.        Behavior: Behavior normal.        Thought Content: Thought content normal.   Last CBC Lab Results  Component Value Date   WBC 6.9 06/29/2022   HGB 14.3 06/29/2022   HCT 41.4 06/29/2022   MCV 95.6 06/29/2022   MCH 33.0 06/29/2022   RDW 13.2 06/29/2022   PLT 180 06/29/2022   Last metabolic panel Lab Results  Component Value Date   GLUCOSE 100 (H) 06/29/2022   NA 136 06/29/2022   K 3.9 06/29/2022   CL 101 06/29/2022   CO2 26 06/29/2022   BUN 14 06/29/2022   CREATININE 1.13 06/29/2022   GFRNONAA >60 06/29/2022   CALCIUM 9.2 06/29/2022   PROT 8.0 06/29/2022   ALBUMIN 3.9 06/29/2022    LABGLOB 3.6 07/20/2019   AGRATIO 1.3 07/20/2019   BILITOT 0.8 06/29/2022   ALKPHOS 84 06/29/2022   AST 31 06/29/2022   ALT 39 06/29/2022   ANIONGAP 9 06/29/2022  Last lipids Lab Results  Component Value Date   CHOL 195 01/28/2023   HDL 72 01/28/2023   LDLCALC 99 01/28/2023   TRIG 137 01/28/2023   CHOLHDL 2.7 01/28/2023   Last hemoglobin A1c Lab Results  Component Value Date   HGBA1C 5.1 06/29/2022   Last thyroid functions Lab Results  Component Value Date   TSH 3.466 06/29/2022   Last vitamin D Lab Results  Component Value Date   VD25OH 29.0 (L) 01/28/2023   Last vitamin B12 and Folate Lab Results  Component Value Date   VITAMINB12 1,695 (H) 07/20/2019   FOLATE >20.0 07/20/2019   The 10-year ASCVD risk score (Arnett DK, et al., 2019) is: 16.8%    Assessment & Plan:   Problem List Items Addressed This Visit       Essential hypertension    BP remains adequately controlled on his currently prescribed antihypertensive regimen.  No medication changes are indicated today.      Lumbar radiculopathy    Acute visit on 9/16 for lumbar back pain.  Treated with prednisone taper and Flexeril.  He is currently doing home PT.  X-ray of the lumbar spine did not demonstrate any acute findings.  Today he reports that his pain is improving.  He will continue home PT and as needed use of Flexeril for pain relief.      Hyperlipidemia - Primary    Lipid panel updated in February.  Total cholesterol 217 and LDL 118.  He is currently prescribed pravastatin 80 mg daily, Zetia 10 mg daily, and fish oil supplementation.  Zetia was added at his last appointment. -Repeat lipid panel ordered today      Vitamin D deficiency    Noted on previous labs.  He is currently taking daily vitamin D supplementation.  Repeat vitamin D level ordered today.      Need for influenza vaccination    Influenza vaccine administered today      Return in about 6 months (around 07/28/2023).    Billie Lade, MD

## 2023-01-29 LAB — LIPID PANEL
Chol/HDL Ratio: 2.7 ratio (ref 0.0–5.0)
Cholesterol, Total: 195 mg/dL (ref 100–199)
HDL: 72 mg/dL (ref >39)
LDL Chol Calc (NIH): 99 mg/dL (ref 0–99)
Triglycerides: 137 mg/dL (ref 0–149)
VLDL Cholesterol Cal: 24 mg/dL (ref 5–40)

## 2023-01-29 LAB — VITAMIN D 25 HYDROXY (VIT D DEFICIENCY, FRACTURES): Vit D, 25-Hydroxy: 29 ng/mL — ABNORMAL LOW (ref 30.0–100.0)

## 2023-02-03 ENCOUNTER — Encounter: Payer: Self-pay | Admitting: Internal Medicine

## 2023-02-03 DIAGNOSIS — Z23 Encounter for immunization: Secondary | ICD-10-CM | POA: Insufficient documentation

## 2023-02-03 DIAGNOSIS — E559 Vitamin D deficiency, unspecified: Secondary | ICD-10-CM | POA: Insufficient documentation

## 2023-02-03 NOTE — Assessment & Plan Note (Signed)
Influenza vaccine administered today.

## 2023-02-03 NOTE — Assessment & Plan Note (Signed)
BP remains adequately controlled on his currently prescribed antihypertensive regimen.  No medication changes are indicated today.

## 2023-02-03 NOTE — Assessment & Plan Note (Signed)
Noted on previous labs.  He is currently taking daily vitamin D supplementation. -Repeat vitamin D level ordered today

## 2023-02-03 NOTE — Assessment & Plan Note (Signed)
Lipid panel updated in February.  Total cholesterol 217 and LDL 118.  He is currently prescribed pravastatin 80 mg daily, Zetia 10 mg daily, and fish oil supplementation.  Zetia was added at his last appointment. -Repeat lipid panel ordered today

## 2023-02-03 NOTE — Assessment & Plan Note (Signed)
Acute visit on 9/16 for lumbar back pain.  Treated with prednisone taper and Flexeril.  He is currently doing home PT.  X-ray of the lumbar spine did not demonstrate any acute findings.  Today he reports that his pain is improving.  He will continue home PT and as needed use of Flexeril for pain relief.

## 2023-02-13 ENCOUNTER — Other Ambulatory Visit: Payer: Self-pay | Admitting: Internal Medicine

## 2023-02-15 ENCOUNTER — Other Ambulatory Visit: Payer: Self-pay

## 2023-02-15 DIAGNOSIS — M5416 Radiculopathy, lumbar region: Secondary | ICD-10-CM

## 2023-02-19 NOTE — Progress Notes (Signed)
Referring Physician:  Anabel Halon, MD [address] [place],  [place] [address]  Primary Physician:  Billie Lade, MD  History of Present Illness: 02/24/2023 [name] has a history of BPH, HTN, hyperlipidemia, LVH, GERD.   History of ACDF C5-C7 on 08/12/16 by Dr. Ophelia Charter.   History of chronic back pain x years with a 6 week history of constant LBP, buttock, with bilateral posterior leg pain to his calf. He has some lateral right leg pain to his foot as well when he is walking. Right leg pain > left leg pain. LBP = leg pain. Started after trying to get riding lawnmower out of a ditch. Pain is worse with walking and better with laying down. He has no numbness or tingling. He notes weakness in both legs.   Given prednisone taper by PCP- this helped when he was taking it.  Is doing HEP for his back from PCP.   Bowel/Bladder Dysfunction: none  Conservative measures:  Physical therapy: none, doing HEP from PCP Multimodal medical therapy including regular antiinflammatories: voltaren, tylenol, prednisone, cyclobenzaprine Injections: No epidural steroid injections  Past Surgery:  History of ACDF C5-C7 on 08/12/16 by Dr. Ophelia Charter.   Kyle Rivers has no symptoms of cervical myelopathy.  The symptoms are causing a significant impact on the patient's life.   Review of Systems:  A 10 point review of systems is negative, except for the pertinent positives and negatives detailed in the HPI.  Past Medical History: Past Medical History:  Diagnosis Date   BPH (benign prostatic hypertrophy)    DJD (degenerative joint disease)    in lower back    Ear infection 08/12/2016   pt believes he may be coming down with an ear infection today   Hemorrhoids    Hyperlipidemia    Hypertension    LVH (left ventricular hypertrophy)    LVH variant, EKG show ST elevation, normal cardiac catherization 2009   Prostatitis     Past Surgical History: Past Surgical History:  Procedure  Laterality Date   ANTERIOR CERVICAL DECOMP/DISCECTOMY FUSION N/A 08/12/2016   Procedure: C5-6, C6-7 ANTERIOR CERVICAL DECOMPRESSION/DISCECTOMY FUSION 2 LEVELS, ALLOGRAFT, PLATE;  Surgeon: Eldred Manges, MD;  Location: [facility] OR;  Service: Orthopedics;  Laterality: N/A;   CARDIAC CATHETERIZATION     CERVICAL DISC SURGERY  08/13/2015   c 5 c6 c7   COLONOSCOPY     COLONOSCOPY N/A 10/02/2016   Procedure: COLONOSCOPY;  Surgeon: Malissa Hippo, MD;  Location: [facility];  Service: Endoscopy;  Laterality: N/A;  12:45   COLONOSCOPY WITH PROPOFOL N/A 12/24/2021   Procedure: COLONOSCOPY WITH PROPOFOL;  Surgeon: Dolores Frame, MD;  Location: [facility];  Service: Gastroenterology;  Laterality: N/A;  730   dental inplants     HEMORRHOID SURGERY     POLYPECTOMY  10/02/2016   Procedure: POLYPECTOMY;  Surgeon: Malissa Hippo, MD;  Location: [facility];  Service: Endoscopy;;  colon   POLYPECTOMY  12/24/2021   Procedure: POLYPECTOMY;  Surgeon: Dolores Frame, MD;  Location: [facility];  Service: Gastroenterology;;   UPPER GASTROINTESTINAL ENDOSCOPY      Allergies: Allergies as of 02/24/2023 - Review Complete 02/24/2023  Allergen Reaction Noted   Atorvastatin Other (See Comments) and Rash 04/02/2014   Oxycodone Nausea Only and Other (See Comments) 08/13/2016    Medications: Outpatient Encounter Medications as of 02/24/2023  Medication Sig   acetaminophen (TYLENOL) 650 MG CR tablet Take 1,300 mg by  mouth 2 (two) times daily.   amLODipine (NORVASC) 10 MG tablet Take 1 tablet (10 mg total) by mouth daily.   cyclobenzaprine (FLEXERIL) 5 MG tablet Take 1 tablet (5 mg total) by mouth 2 (two) times daily as needed for muscle spasms.   ezetimibe (ZETIA) 10 MG tablet TAKE 1 TABLET(10 MG) BY MOUTH DAILY   fish oil-omega-3 fatty acids 1000 MG capsule Take 2 g by mouth daily.    Menthol-Methyl Salicylate (MUSCLE RUB) 10-15 % CREA Apply 1 application topically as needed for muscle  pain.   metoprolol tartrate (LOPRESSOR) 25 MG tablet Take 0.5 tablets (12.5 mg total) by mouth 2 (two) times daily.   OVER THE COUNTER MEDICATION See admin instructions. Genius brand Digestion Opitimizer. Take 3-4 capsules daily.   pravastatin (PRAVACHOL) 80 MG tablet TAKE 1 TABLET(80 MG) BY MOUTH DAILY   Probiotic Product (ACIDOPHILUS/BIFIDUS PO) Take 1 capsule by mouth daily.   tamsulosin (FLOMAX) 0.4 MG CAPS capsule Take 1 capsule (0.4 mg total) by mouth daily.   TURMERIC PO Take 1,000 mg by mouth daily.    [DISCONTINUED] diclofenac (VOLTAREN) 75 MG EC tablet TAKE 1 TABLET(75 MG) BY MOUTH TWICE DAILY AS NEEDED FOR MODERATE PAIN   [DISCONTINUED] ezetimibe (ZETIA) 10 MG tablet Take 1 tablet (10 mg total) by mouth daily.   [DISCONTINUED] predniSONE (STERAPRED UNI-PAK 21 TAB) 10 MG (21) TBPK tablet Take as package instructions.   No facility-administered encounter medications on file as of 02/24/2023.    Social History: Social History   Tobacco Use   Smoking status: Former    Current packs/day: 0.50    Average packs/day: 0.5 packs/day for 30.0 years (15.0 ttl pk-yrs)    Types: Cigarettes    Passive exposure: Never   Smokeless tobacco: Former  Building services engineer status: Never Used  Substance Use Topics   Alcohol use: Yes    Comment: Occ   Drug use: No    Family Medical History: Family History  Problem Relation Age of Onset   Stroke Mother    Hypertension Father    Diabetes Father    Prostate cancer Father    Diabetes Sister    Diabetes Sister     Physical Examination: Vitals:   02/24/23 1329 02/24/23 1358  BP: (!) 160/98 (!) 138/92    General: Patient is well developed, well nourished, calm, collected, and in no apparent distress. Attention to examination is appropriate.  Respiratory: Patient is breathing without any difficulty.   NEUROLOGICAL:     Awake, alert, oriented to person, place, and time.  Speech is clear and fluent. Fund of knowledge is appropriate.    Cranial Nerves: Pupils equal round and reactive to light.  Facial tone is symmetric.    No lower lumbar tenderness.   No abnormal lesions on exposed skin.   Strength: Side Biceps Triceps Deltoid Interossei Grip Wrist Ext. Wrist Flex.  R 5 5 5 5 5 5 5   L 5 5 5 5 5 5 5    Side Iliopsoas Quads Hamstring PF DF EHL  R 5 5 5 5 5 5   L 5 5 5 5 5 5    Reflexes are 2+ and symmetric at the biceps, brachioradialis, patella and achilles.   Hoffman's is absent.  Clonus is not present.   Bilateral upper and lower extremity sensation is intact to light touch.     No pain with IR/ER of both hips.   Gait is normal.     Medical Decision Making  Imaging:  Lumbar xrays dated 01/27/23:  FINDINGS: Preserved vertebral body heights without acute compression fracture, wedge-shaped deformity or focal kyphosis. Similar minimal grade 1 anterolisthesis of L4 upon L5. Advanced degenerative disc disease at L5-S1 with disc space narrowing, sclerosis and endplate osteophytes. Facet arthropathy spanning L3-S1. Overall similar appearance. SI joints are maintained. Bones are osteopenic. Aortoiliac atherosclerosis noted.   IMPRESSION: 1. Similar degenerative changes as above. No acute finding by plain radiography. 2. Osteopenia. 3. Aortoiliac atherosclerosis.     Electronically Signed   By: Judie Petit.  Shick M.D.   On: 02/12/2023 16:48  I have personally reviewed the images and agree with the above interpretation.  Assessment and Plan: Mr. Haffey is a pleasant 67 y.o. male who has a history of chronic back pain x years with a 6 week history of constant LBP with bilateral posterior leg pain to his calf. He has some lateral right leg pain to his foot as well when he is walking. Right leg pain > left leg pain. LBP = leg pain.   He has known lumbar spondylosis with DDD at L4-S1 along with slip at L4-L5. LBP and bilateral leg pain appears lumbar mediated.   Treatment options discussed with patient and following  plan made:   - MRI of lumbar spine to further evaluate bilateral lumbar radiculopathy. No improvement with time or medications.  - Continue HEP for back from PCP.  - Depending on MRI results, may consider PT and/or injections. Not sure he wants to injections as one of his friends had was "paralyzed" after an injection.  - Will schedule phone visit to review MRI once I have the results back.   BP was elevated- likely because he couldn't find our building and was late. No symptoms of chest pain, shortness of breath, blurry vision, or headaches. He was advised to recheck at home and call PCP if not improved. If he develops CP, SOB, blurry vision, or headaches, then he will go to ED.     I spent a total of 30 minutes in face-to-face and non-face-to-face activities related to this patient's care today including review of outside records, review of imaging, review of symptoms, physical exam, discussion of differential diagnosis, discussion of treatment options, and documentation.   Thank you for involving me in the care of this patient.   Drake Leach PA-C Dept. of Neurosurgery

## 2023-02-20 ENCOUNTER — Other Ambulatory Visit: Payer: Self-pay | Admitting: Cardiology

## 2023-02-24 ENCOUNTER — Ambulatory Visit: Payer: Medicare HMO | Admitting: Orthopedic Surgery

## 2023-02-24 ENCOUNTER — Encounter: Payer: Self-pay | Admitting: Orthopedic Surgery

## 2023-02-24 VITALS — BP 138/92 | Ht 67.0 in | Wt 130.6 lb

## 2023-02-24 DIAGNOSIS — M51362 Other intervertebral disc degeneration, lumbar region with discogenic back pain and lower extremity pain: Secondary | ICD-10-CM | POA: Diagnosis not present

## 2023-02-24 DIAGNOSIS — M4316 Spondylolisthesis, lumbar region: Secondary | ICD-10-CM | POA: Diagnosis not present

## 2023-02-24 DIAGNOSIS — M47816 Spondylosis without myelopathy or radiculopathy, lumbar region: Secondary | ICD-10-CM

## 2023-02-24 DIAGNOSIS — M4726 Other spondylosis with radiculopathy, lumbar region: Secondary | ICD-10-CM

## 2023-02-24 DIAGNOSIS — M5416 Radiculopathy, lumbar region: Secondary | ICD-10-CM

## 2023-02-24 NOTE — Patient Instructions (Addendum)
It was so nice to see you today. Thank you so much for coming in.    You have some wear and tear in your back (arthritis). This may be causing your pain.   I want to get an MRI of your lower back to look into things further. We will get this approved through your insurance and [facility] will call you to schedule the appointment.   After you have the MRI, it takes 10-14 days for me to get the results back. Once I have them, we will call you to schedule a follow up phone visit with me to review them.   Your blood pressure was elevated today. I want you to recheck it at home and follow up with your PCP if it remains high. If you have any chest pain, shortness of breath, blurry vision, or headaches then you need to go to ED.    Please do not hesitate to call if you have any questions or concerns. You can also message me in MyChart.   If you have not heard back about the MRI in the next week, please call the office so we can help you get it scheduled.   Drake Leach PA-C [phone]     The physicians and staff at [facility] Neurosurgery at [facility] are committed to providing excellent care. You may receive a survey asking for feedback about your experience at our office. We value you your feedback and appreciate you taking the time to to fill it out. The [facility] leadership team is also available to discuss your experience in person, feel free to contact us [phone].

## 2023-03-02 ENCOUNTER — Ambulatory Visit (HOSPITAL_COMMUNITY)
Admission: RE | Admit: 2023-03-02 | Discharge: 2023-03-02 | Disposition: A | Discharge status: Home / Self Care | Payer: Medicare HMO | Source: Ambulatory Visit | Attending: Orthopedic Surgery | Admitting: Orthopedic Surgery

## 2023-03-02 DIAGNOSIS — M51362 Other intervertebral disc degeneration, lumbar region with discogenic back pain and lower extremity pain: Secondary | ICD-10-CM | POA: Diagnosis present

## 2023-03-02 DIAGNOSIS — M4316 Spondylolisthesis, lumbar region: Secondary | ICD-10-CM

## 2023-03-02 DIAGNOSIS — M47816 Spondylosis without myelopathy or radiculopathy, lumbar region: Secondary | ICD-10-CM | POA: Diagnosis present

## 2023-03-02 DIAGNOSIS — M5416 Radiculopathy, lumbar region: Secondary | ICD-10-CM | POA: Diagnosis present

## 2023-03-26 NOTE — Progress Notes (Unsigned)
Telephone Visit- Progress Note: Referring Physician:  No referring provider defined for this encounter.  Primary Physician:  Billie Lade, MD  This visit was performed via telephone.  Patient location: home Provider location: office  I spent a total of 12 minutes non-face-to-face activities for this visit on the date of this encounter including review of current clinical condition and response to treatment.    Patient has given verbal consent to this telephone visits and we reviewed the limitations of a telephone visit. Patient wishes to proceed.    Chief Complaint:  f/u for MRI results  History of Present Illness: Kyle Rivers is a 67 y.o. male has a history of  BPH, HTN, hyperlipidemia, LVH, GERD.    History of ACDF C5-C7 on 08/12/16 by Dr. Ophelia Charter.   Last seen by me on 02/24/23 for back and bilateral leg pain. He has known lumbar spondylosis with DDD at L4-S1 along with slip at L4-L5. LBP and bilateral leg pain appears lumbar mediated.   He is doing better since his last visit. He has intermittent LBP with intermittent bilateral posterior leg pain to his knees. Pain is worse with standing and walking. LBP > leg pain, right leg pain > left leg pain. No numbness, tingling, or weakness in his legs.   He is still taking tylenol arthritis prn. He is doing HEP from PCP and these seem to help a lot. Heating pad helps as well.    Bowel/Bladder Dysfunction: none   Conservative measures:  Physical therapy: none, doing HEP from PCP Multimodal medical therapy including regular antiinflammatories: voltaren, tylenol, prednisone, cyclobenzaprine Injections: No epidural steroid injections   Past Surgery:  History of ACDF C5-C7 on 08/12/16 by Dr. Ophelia Charter.    Kyle Rivers has no symptoms of cervical myelopathy.   The symptoms are causing a significant impact on the patient's life.    Exam: No exam done as this was a telephone encounter.     Imaging: MRI of lumbar spine dated  03/02/23:  FINDINGS: Segmentation:  5 lumbar type vertebral bodies.   Alignment:  4-5 mm of degenerative anterolisthesis at L4-5.   Vertebrae: No fracture or focal bone lesion. Degenerative endplate marrow changes at L3-4, L4-5 and L5-S1, with mild edema which could relate to regional back pain.   Chronic T2 bright foci seen within S2, S3 and S4 were present in 2010 and are therefore benign.   Conus medullaris and cauda equina: Conus extends to the L1 level. Conus and cauda equina appear normal.   Paraspinal and other soft tissues: Negative   Disc levels:   T12-L1 and L1-2: Normal   L2-3: Mild bulging of the disc. Right-sided facet degeneration and hypertrophy. Mild narrowing of the right lateral recess and foramen on the right but no likely neural compression.   L3-4: Circumferential bulging of the disc. Facet and ligamentous hypertrophy. Mild narrowing of the lateral recess and foramina. Some potential the left L3 nerve could be affected within the foramen.   L4-5: Facet arthropathy with anterolisthesis of 4-5 mm. Disc degeneration with bulging and a small central herniation with slight upward migration behind the inferior endplate of L4. Moderate multifactorial stenosis at this level which could cause neural compression on either or both sides. Findings could certainly relate to regional back pain.   L5-S1: Chronic disc degeneration with loss of height. Endplate osteophytes and mild bulging of the disc. No central canal stenosis. Mild narrowing of the subarticular lateral recesses. Moderate bilateral foraminal narrowing but without  definite compression of the exiting L5 nerves.   IMPRESSION: 1. L2-3: Mild bulging of the disc. Right-sided facet degeneration and hypertrophy. Mild narrowing of the right lateral recess and foramen but no likely neural compression. 2. L3-4: Circumferential bulging of the disc. Facet and ligamentous hypertrophy. Mild narrowing of the  lateral recess and foramina. Some potential the left L3 nerve could be affected within the foramen. 3. L4-5: Facet arthropathy with anterolisthesis of 4-5 mm. Disc degeneration with bulging and a small central herniation with slight upward migration behind the inferior endplate of L4. Moderate multifactorial stenosis at this level which could cause neural compression on either or both sides. Findings could certainly relate to regional back pain. 4. L5-S1: Chronic disc degeneration with loss of height. Endplate osteophytes and mild bulging of the disc. Mild narrowing of the subarticular lateral recesses. Moderate bilateral foraminal narrowing but without definite compression of the exiting L5 nerves. 5. The degenerative disc disease and degenerative facet disease in general throughout the lower lumbar region could relate to regional pain.     Electronically Signed   By: Paulina Fusi M.D.   On: 03/26/2023 12:41    I have personally reviewed the images and agree with the above interpretation.  Assessment and Plan: Kyle Rivers is a pleasant 67 y.o. male who is doing better since his last visit.   He has intermittent LBP with intermittent bilateral posterior leg pain to his knees. Pain is worse with standing and walking. LBP > leg pain, right leg pain > left leg pain. No numbness, tingling, or weakness in his legs.   He has known slip at L4-L5 with moderate central stenosis and mild bilateral foraminal stenosis. Also with DDD/facet hypertrophy L5-S1 with moderate bilateral foraminal stenosis.   LBP likely from underlying DDD/spondylosis. Leg pain likely from L4-L5 and/or L5-S1.    Treatment options discussed with patient and following plan made:    - Continue HEP for back from PCP. Will add formal PT for his lumbar spine. Orders to [facility] in [place].  - Discussed lumbar injections. He declines for now as one of his friends was "paralyzed" after an injection.  - Phone visit follow up  in 6-8 weeks.    Drake Leach PA-C Neurosurgery

## 2023-03-31 ENCOUNTER — Encounter: Payer: Self-pay | Admitting: Orthopedic Surgery

## 2023-03-31 ENCOUNTER — Ambulatory Visit (INDEPENDENT_AMBULATORY_CARE_PROVIDER_SITE_OTHER): Payer: Medicare HMO | Admitting: Orthopedic Surgery

## 2023-03-31 DIAGNOSIS — M4726 Other spondylosis with radiculopathy, lumbar region: Secondary | ICD-10-CM

## 2023-03-31 DIAGNOSIS — M4316 Spondylolisthesis, lumbar region: Secondary | ICD-10-CM

## 2023-03-31 DIAGNOSIS — M48061 Spinal stenosis, lumbar region without neurogenic claudication: Secondary | ICD-10-CM

## 2023-03-31 DIAGNOSIS — M51362 Other intervertebral disc degeneration, lumbar region with discogenic back pain and lower extremity pain: Secondary | ICD-10-CM | POA: Diagnosis not present

## 2023-03-31 DIAGNOSIS — M5416 Radiculopathy, lumbar region: Secondary | ICD-10-CM

## 2023-03-31 DIAGNOSIS — M47816 Spondylosis without myelopathy or radiculopathy, lumbar region: Secondary | ICD-10-CM

## 2023-04-14 DIAGNOSIS — M48061 Spinal stenosis, lumbar region without neurogenic claudication: Secondary | ICD-10-CM | POA: Diagnosis not present

## 2023-04-14 DIAGNOSIS — M47816 Spondylosis without myelopathy or radiculopathy, lumbar region: Secondary | ICD-10-CM | POA: Diagnosis not present

## 2023-04-14 DIAGNOSIS — M51362 Other intervertebral disc degeneration, lumbar region with discogenic back pain and lower extremity pain: Secondary | ICD-10-CM | POA: Diagnosis not present

## 2023-04-14 DIAGNOSIS — M4316 Spondylolisthesis, lumbar region: Secondary | ICD-10-CM | POA: Diagnosis not present

## 2023-04-14 DIAGNOSIS — M5416 Radiculopathy, lumbar region: Secondary | ICD-10-CM | POA: Diagnosis not present

## 2023-04-20 ENCOUNTER — Other Ambulatory Visit: Payer: Self-pay | Admitting: Internal Medicine

## 2023-04-20 DIAGNOSIS — M4316 Spondylolisthesis, lumbar region: Secondary | ICD-10-CM | POA: Diagnosis not present

## 2023-04-20 DIAGNOSIS — M47816 Spondylosis without myelopathy or radiculopathy, lumbar region: Secondary | ICD-10-CM | POA: Diagnosis not present

## 2023-04-20 DIAGNOSIS — M48061 Spinal stenosis, lumbar region without neurogenic claudication: Secondary | ICD-10-CM | POA: Diagnosis not present

## 2023-04-20 DIAGNOSIS — M51362 Other intervertebral disc degeneration, lumbar region with discogenic back pain and lower extremity pain: Secondary | ICD-10-CM | POA: Diagnosis not present

## 2023-04-20 DIAGNOSIS — M5416 Radiculopathy, lumbar region: Secondary | ICD-10-CM | POA: Diagnosis not present

## 2023-04-22 DIAGNOSIS — M48061 Spinal stenosis, lumbar region without neurogenic claudication: Secondary | ICD-10-CM | POA: Diagnosis not present

## 2023-04-22 DIAGNOSIS — M47816 Spondylosis without myelopathy or radiculopathy, lumbar region: Secondary | ICD-10-CM | POA: Diagnosis not present

## 2023-04-22 DIAGNOSIS — M5416 Radiculopathy, lumbar region: Secondary | ICD-10-CM | POA: Diagnosis not present

## 2023-04-22 DIAGNOSIS — M4316 Spondylolisthesis, lumbar region: Secondary | ICD-10-CM | POA: Diagnosis not present

## 2023-04-22 DIAGNOSIS — M51362 Other intervertebral disc degeneration, lumbar region with discogenic back pain and lower extremity pain: Secondary | ICD-10-CM | POA: Diagnosis not present

## 2023-05-21 NOTE — Progress Notes (Signed)
   Telephone Visit- Progress Note: Referring Physician:  Tobi Fortes, MD [address] [place],  [place] [address]  Primary Physician:  Tobi Fortes, MD  This visit was performed via telephone.  Patient location: home Provider location: office  I spent a total of 10 minutes non-face-to-face activities for this visit on the date of this encounter including review of current clinical condition and response to treatment.    Patient has given verbal consent to this telephone visits and we reviewed the limitations of a telephone visit. Patient wishes to proceed.    Chief Complaint:  f/u   History of Present Illness: Kyle Rivers is a 68 y.o. male has a history of  BPH, HTN, hyperlipidemia, LVH, GERD.    History of ACDF C5-C7 on 08/12/16 by Dr. Murrel Arnt.   Did phone visit with him on 03/31/23 and he had intermittent LBP with intermittent bilateral leg pain. He has known slip at L4-L5 with moderate central stenosis and mild bilateral foraminal stenosis. Also with DDD/facet hypertrophy L5-S1 with moderate bilateral foraminal stenosis.    LBP likely from underlying DDD/spondylosis. Leg pain likely from L4-L5 and/or L5-S1.   PT was ordered in [place]. Injections discussed and he declined.   Phone visit scheduled to check his progress.   He had initial PT eval on 04/05/23 and he went for about 4 weeks. He feels like this has helped. He is doing HEP. He is much more mobile,no pain with standing or walking. He has intermittent LBP that is tolerable. He has no leg pain. No numbness, tingling, or weakness in his legs.   He takes prn tylenol . Has not taken flexeril  in weeks.    Bowel/Bladder Dysfunction: none   Conservative measures:  Physical therapy: initial eval on 04/05/23, did 4 weeks and was not discharged.  Multimodal medical therapy including regular antiinflammatories: voltaren , tylenol , prednisone , cyclobenzaprine  Injections: No epidural steroid injections   Past  Surgery:  History of ACDF C5-C7 on 08/12/16 by Dr. Murrel Arnt.    Kyle Rivers has no symptoms of cervical myelopathy.   The symptoms are causing a significant impact on the patient's life.    Exam: No exam done as this was a telephone encounter.     Imaging: none    Assessment and Plan: Kyle Rivers is a pleasant 68 y.o. male who is doing better since his last visit.   He had good improvement with PT. He has intermittent LBP that is tolerable. He has no leg pain. No numbness, tingling, or weakness in his legs.   He has known slip at L4-L5 with moderate central stenosis and mild bilateral foraminal stenosis. Also with DDD/facet hypertrophy L5-S1 with moderate bilateral foraminal stenosis.   LBP likely from underlying DDD/spondylosis. Leg pain likely from L4-L5 and/or L5-S1.    Treatment options discussed with patient and following plan made:    - Continue HEP for back from PT.  - Return to activity as tolerated.  - Follow up if pain gets worse, would consider revisiting PT. We discussed lumbar injections previously. He is afraid to do these since one of his friends was "paralyzed" after an injection.   Lucetta Russel PA-C Neurosurgery

## 2023-05-26 ENCOUNTER — Ambulatory Visit: Payer: Medicare HMO | Admitting: Orthopedic Surgery

## 2023-05-26 ENCOUNTER — Encounter: Payer: Self-pay | Admitting: Orthopedic Surgery

## 2023-05-26 DIAGNOSIS — M51362 Other intervertebral disc degeneration, lumbar region with discogenic back pain and lower extremity pain: Secondary | ICD-10-CM | POA: Diagnosis not present

## 2023-05-26 DIAGNOSIS — M4316 Spondylolisthesis, lumbar region: Secondary | ICD-10-CM | POA: Diagnosis not present

## 2023-05-26 DIAGNOSIS — M4726 Other spondylosis with radiculopathy, lumbar region: Secondary | ICD-10-CM

## 2023-05-26 DIAGNOSIS — M5416 Radiculopathy, lumbar region: Secondary | ICD-10-CM

## 2023-05-26 DIAGNOSIS — M48061 Spinal stenosis, lumbar region without neurogenic claudication: Secondary | ICD-10-CM | POA: Diagnosis not present

## 2023-05-26 DIAGNOSIS — M47816 Spondylosis without myelopathy or radiculopathy, lumbar region: Secondary | ICD-10-CM

## 2023-06-08 ENCOUNTER — Other Ambulatory Visit: Payer: Self-pay | Admitting: Internal Medicine

## 2023-06-23 ENCOUNTER — Other Ambulatory Visit: Payer: Self-pay | Admitting: Internal Medicine

## 2023-06-25 ENCOUNTER — Ambulatory Visit: Payer: Medicare HMO | Attending: Cardiology | Admitting: Cardiology

## 2023-06-25 ENCOUNTER — Encounter: Payer: Self-pay | Admitting: Cardiology

## 2023-06-25 VITALS — BP 135/85 | HR 66 | Ht 67.0 in | Wt 137.0 lb

## 2023-06-25 DIAGNOSIS — R002 Palpitations: Secondary | ICD-10-CM

## 2023-06-25 DIAGNOSIS — I517 Cardiomegaly: Secondary | ICD-10-CM | POA: Diagnosis not present

## 2023-06-25 DIAGNOSIS — I6529 Occlusion and stenosis of unspecified carotid artery: Secondary | ICD-10-CM

## 2023-06-25 DIAGNOSIS — I10 Essential (primary) hypertension: Secondary | ICD-10-CM

## 2023-06-25 NOTE — Patient Instructions (Signed)
Medication Instructions:  Your physician recommends that you continue on your current medications as directed. Please refer to the Current Medication list given to you today.  *If you need a refill on your cardiac medications before your next appointment, please call your pharmacy*   Lab Work: None If you have labs (blood work) drawn today and your tests are completely normal, you will receive your results only by: MyChart Message (if you have MyChart) OR A paper copy in the mail If you have any lab test that is abnormal or we need to change your treatment, we will call you to review the results.   Testing/Procedures: Your physician has requested that you have an echocardiogram. Echocardiography is a painless test that uses sound waves to create images of your heart. It provides your doctor with information about the size and shape of your heart and how well your heart's chambers and valves are working. This procedure takes approximately one hour. There are no restrictions for this procedure. Please do NOT wear cologne, perfume, aftershave, or lotions (deodorant is allowed). Please arrive 15 minutes prior to your appointment time.  Please note: We ask at that you not bring children with you during ultrasound (echo/ vascular) testing. Due to room size and safety concerns, children are not allowed in the ultrasound rooms during exams. Our front office staff cannot provide observation of children in our lobby area while testing is being conducted. An adult accompanying a patient to their appointment will only be allowed in the ultrasound room at the discretion of the ultrasound technician under special circumstances. We apologize for any inconvenience.  Your physician has requested that you have a carotid duplex. This test is an ultrasound of the carotid arteries in your neck. It looks at blood flow through these arteries that supply the brain with blood. Allow one hour for this exam. There are no  restrictions or special instructions.    Follow-Up: At [facility], you and your health needs are our priority.  As part of our continuing mission to provide you with exceptional heart care, we have created designated Provider Care Teams.  These Care Teams include your primary Cardiologist (physician) and Advanced Practice Providers (APPs -  Physician Assistants and Nurse Practitioners) who all work together to provide you with the care you need, when you need it.  We recommend signing up for the patient portal called "MyChart".  Sign up information is provided on this After Visit Summary.  MyChart is used to connect with patients for Virtual Visits (Telemedicine).  Patients are able to view lab/test results, encounter notes, upcoming appointments, etc.  Non-urgent messages can be sent to your provider as well.   To learn more about what you can do with MyChart, go to [email or web address].    Your next appointment:   6 month(s)  Provider:   You may see Dina Rich, MD or one of the following Advanced Practice Providers on your designated Care Team:   Randall An, PA-C  Jacolyn Reedy, [place]     Other Instructions

## 2023-06-25 NOTE — Progress Notes (Signed)
Clinical Summary Mr. Persley is a 68 y.o.male seen today for follow up of the following medical problems    1. Palpitations 03/2018 monitor: Min HR 85, Max HR 193, Avg HR 85 Available strips show SVT up to 10 beats, NSVT up to 7 beats.   - denies any palpitations - compliant with meds       2. HTN - history of LVH, reports history HTN since age 71 - home bp's 130s/70s   3. Aortic atherosclerosis - noted by CT scann   4. Carotid stenosis - mild bilateral disease 2021 [place]     5. Hyperlipidemia - rash on lipitor, has been on pravastatin. Pcp recently increased to 80mg  daily.  01/2023 TC 195 TG 137 HDL 72 LDL 99   6. 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 From that note plans to f/u 2026     SH: [place], [place] has home down there. Recently retired     Past Medical History:  Diagnosis Date   BPH (benign prostatic hypertrophy)    DJD (degenerative joint disease)    in lower back    Ear infection 08/12/2016   pt believes he may be coming down with an ear infection today   Hemorrhoids    Hyperlipidemia    Hypertension    LVH (left ventricular hypertrophy)    LVH variant, EKG show ST elevation, normal cardiac catherization 2009   Prostatitis      Allergies  Allergen Reactions   Atorvastatin Other (See Comments) and Rash    "Bumps showed up on skin "   Oxycodone Nausea Only and Other (See Comments)    " makes me sick with nausea "     Current Outpatient Medications  Medication Sig Dispense Refill   acetaminophen (TYLENOL) 650 MG CR tablet Take 1,300 mg by mouth 2 (two) times daily.     amLODipine (NORVASC) 10 MG tablet TAKE 1 TABLET(10 MG) BY MOUTH DAILY 90 tablet 3   cyclobenzaprine (FLEXERIL) 5 MG tablet Take 1 tablet (5 mg total) by mouth 2 (two) times daily as needed for muscle spasms. 30 tablet 1   ezetimibe (ZETIA) 10 MG tablet TAKE 1 TABLET(10 MG) BY MOUTH DAILY 30 tablet 3   fish oil-omega-3 fatty acids  1000 MG capsule Take 2 g by mouth daily.      Menthol-Methyl Salicylate (MUSCLE RUB) 10-15 % CREA Apply 1 application topically as needed for muscle pain.     metoprolol tartrate (LOPRESSOR) 25 MG tablet Take 0.5 tablets (12.5 mg total) by mouth 2 (two) times daily.     OVER THE COUNTER MEDICATION See admin instructions. Genius brand Digestion Opitimizer. Take 3-4 capsules daily.     pravastatin (PRAVACHOL) 80 MG tablet TAKE 1 TABLET(80 MG) BY MOUTH DAILY 30 tablet 1   Probiotic Product (ACIDOPHILUS/BIFIDUS PO) Take 1 capsule by mouth daily.     tamsulosin (FLOMAX) 0.4 MG CAPS capsule TAKE 1 CAPSULE(0.4 MG) BY MOUTH DAILY 90 capsule 3   TURMERIC PO Take 1,000 mg by mouth daily.      No current facility-administered medications for this visit.     Past Surgical History:  Procedure Laterality Date   ANTERIOR CERVICAL DECOMP/DISCECTOMY FUSION N/A 08/12/2016   Procedure: C5-6, C6-7 ANTERIOR CERVICAL DECOMPRESSION/DISCECTOMY FUSION 2 LEVELS, ALLOGRAFT, PLATE;  Surgeon: Eldred Manges, MD;  Location: [facility];  Service: Orthopedics;  Laterality: N/A;   CARDIAC CATHETERIZATION     CERVICAL DISC SURGERY  08/13/2015   c 5 c6 c7   COLONOSCOPY     COLONOSCOPY N/A 10/02/2016   Procedure: COLONOSCOPY;  Surgeon: Malissa Hippo, MD;  [facility]: [facility];  Service: Endoscopy;  Laterality: N/A;  12:45   COLONOSCOPY WITH PROPOFOL N/A 12/24/2021   Procedure: COLONOSCOPY WITH PROPOFOL;  Surgeon: Dolores Frame, MD;  Location: [facility];  Service: Gastroenterology;  Laterality: N/A;  730   dental inplants     HEMORRHOID SURGERY     POLYPECTOMY  10/02/2016   Procedure: POLYPECTOMY;  Surgeon: Malissa Hippo, MD;  [facility]: [facility];  Service: Endoscopy;;  colon   POLYPECTOMY  12/24/2021   Procedure: POLYPECTOMY;  Surgeon: Dolores Frame, MD;  [facility];  Service: Gastroenterology;;   UPPER GASTROINTESTINAL ENDOSCOPY       Allergies  Allergen Reactions    Atorvastatin Other (See Comments) and Rash    "Bumps showed up on skin "   Oxycodone Nausea Only and Other (See Comments)    " makes me sick with nausea "      Family History  Problem Relation Age of Onset   Stroke Mother    Hypertension Father    Diabetes Father    Prostate cancer Father    Diabetes Sister    Diabetes Sister      Social History Mr. Bienvenue reports that he has quit smoking. His smoking use included cigarettes. He has a 15 pack-year smoking history. He has never been exposed to tobacco smoke. He has quit using smokeless tobacco. Mr. Uhlig reports current alcohol use.      Physical Examination Today's Vitals   06/25/23 1413 06/25/23 1453  BP: (!) 144/82 135/85  Pulse: 66   SpO2: 97%   Weight: 137 lb (62.1 kg)   Height: 5\' 7"  (1.702 m)    Body mass index is 21.46 kg/m.  Gen: resting comfortably, no acute distress HEENT: no scleral icterus, pupils equal round and reactive, no palptable cervical adenopathy,  CV:RRR, no mrg, no jvd Resp: Clear to auscultation bilaterally GI: abdomen is soft, non-tender, non-distended, normal bowel sounds, no hepatosplenomegaly MSK: extremities are warm, no edema.  Skin: warm, no rash Neuro:  no focal deficits Psych: appropriate affect   Diagnostic Studies  03/2018 echo Study Conclusions   - Left ventricle: The cavity size was normal. Wall thickness was   increased in a pattern of moderate LVH. Systolic function was   vigorous. The estimated ejection fraction was in the range of 65%   to 70%. Wall motion was normal; there were no regional wall   motion abnormalities. Doppler parameters are consistent with   abnormal left ventricular relaxation (grade 1 diastolic   dysfunction). Doppler parameters are consistent with   indeterminate ventricular filling pressure. - Mitral valve: There was mild regurgitation. - Tricuspid valve: There was mild regurgitation.   03/2018 nuclear stress Blood pressure demonstrated a  hypertensive response to exercise. LVH with diffuse repolarization abnormalities seen throughout study. The study is normal. No evidence of myocardial ischemia or scar. This is a low risk study. Nuclear stress EF: 53%.   Assessment and Plan  1. Palpitations - monitor shows some NSVT and PSVT - echo and nuclear stress do not show any significant underlying heart disease -no symptoms, continue current meds     2. HTN - mildly above goal here but home numbers at goal, continue current meds     3. Hyperlipidemia - rash on atorvastatin - tolerating pravastatin, LDL  at goal   4.LVH - moderate LVH by echo 2019 - marled  LVH pattern on EKG - had suspected prior LVH due to long history of HTN, repeat echo to reassess any progression that could raise question of hypertrophic CM  5. Carotid stenosis - repeat US      Antoine Poche, M.D.

## 2023-06-28 DIAGNOSIS — H52223 Regular astigmatism, bilateral: Secondary | ICD-10-CM | POA: Diagnosis not present

## 2023-06-28 DIAGNOSIS — H5203 Hypermetropia, bilateral: Secondary | ICD-10-CM | POA: Diagnosis not present

## 2023-06-28 DIAGNOSIS — H524 Presbyopia: Secondary | ICD-10-CM | POA: Diagnosis not present

## 2023-07-14 ENCOUNTER — Ambulatory Visit (HOSPITAL_COMMUNITY)
Admission: RE | Admit: 2023-07-14 | Discharge: 2023-07-14 | Disposition: A | Discharge status: Home / Self Care | Payer: Medicare HMO | Source: Ambulatory Visit | Attending: Cardiology | Admitting: Cardiology

## 2023-07-14 DIAGNOSIS — I6523 Occlusion and stenosis of bilateral carotid arteries: Secondary | ICD-10-CM | POA: Diagnosis not present

## 2023-07-14 DIAGNOSIS — I517 Cardiomegaly: Secondary | ICD-10-CM | POA: Diagnosis not present

## 2023-07-14 DIAGNOSIS — I6529 Occlusion and stenosis of unspecified carotid artery: Secondary | ICD-10-CM

## 2023-07-14 LAB — ECHOCARDIOGRAM COMPLETE
Area-P 1/2: 3.3 cm2
S' Lateral: 2 cm

## 2023-07-29 ENCOUNTER — Ambulatory Visit: Payer: Medicare HMO | Admitting: Internal Medicine

## 2023-07-30 ENCOUNTER — Telehealth: Payer: Self-pay

## 2023-07-30 ENCOUNTER — Encounter (HOSPITAL_COMMUNITY): Payer: Self-pay

## 2023-07-30 DIAGNOSIS — I517 Cardiomegaly: Secondary | ICD-10-CM

## 2023-07-30 NOTE — Telephone Encounter (Signed)
-----   Message from Dina Rich sent at 07/29/2023  9:30 AM EDT ----- Echo shows heart muscle is thickened, this could be due to his long history of hypertension but there are some other possible causes as well we need to look for. Can we order a cardiac MRI for him please for severe left ventricular hypertrophy  Dominga Ferry MD

## 2023-07-30 NOTE — Telephone Encounter (Signed)
 The patient has been notified of the result and verbalized understanding.  All questions (if any) were answered. Roseanne Reno, [facility] 07/30/2023 9:43 AM

## 2023-08-04 ENCOUNTER — Encounter: Payer: Self-pay | Admitting: Internal Medicine

## 2023-08-20 ENCOUNTER — Other Ambulatory Visit (HOSPITAL_COMMUNITY)
Admission: RE | Admit: 2023-08-20 | Discharge: 2023-08-20 | Disposition: A | Discharge status: Home / Self Care | Source: Ambulatory Visit | Attending: Cardiology | Admitting: Cardiology

## 2023-08-20 ENCOUNTER — Other Ambulatory Visit: Payer: Self-pay | Admitting: Internal Medicine

## 2023-08-20 DIAGNOSIS — I517 Cardiomegaly: Secondary | ICD-10-CM | POA: Insufficient documentation

## 2023-08-20 LAB — HEMOGLOBIN AND HEMATOCRIT, BLOOD
HCT: 43.2 % (ref 39.0–52.0)
Hemoglobin: 14.9 g/dL (ref 13.0–17.0)

## 2023-08-31 ENCOUNTER — Encounter (HOSPITAL_COMMUNITY): Payer: Self-pay

## 2023-09-02 ENCOUNTER — Other Ambulatory Visit: Payer: Self-pay | Admitting: Cardiology

## 2023-09-02 ENCOUNTER — Ambulatory Visit (HOSPITAL_COMMUNITY)
Admission: RE | Admit: 2023-09-02 | Discharge: 2023-09-02 | Disposition: A | Discharge status: Home / Self Care | Source: Ambulatory Visit | Attending: Cardiology | Admitting: Cardiology

## 2023-09-02 DIAGNOSIS — I517 Cardiomegaly: Secondary | ICD-10-CM | POA: Insufficient documentation

## 2023-09-02 MED ORDER — GADOBUTROL 1 MMOL/ML IV SOLN
6.0000 mL | Freq: Once | INTRAVENOUS | Status: AC | PRN
  Administered 2023-09-02: 6 mL via INTRAVENOUS

## 2023-09-09 ENCOUNTER — Other Ambulatory Visit: Payer: Self-pay | Admitting: Internal Medicine

## 2023-09-10 ENCOUNTER — Encounter: Payer: Self-pay | Admitting: Internal Medicine

## 2023-09-10 ENCOUNTER — Ambulatory Visit (INDEPENDENT_AMBULATORY_CARE_PROVIDER_SITE_OTHER): Payer: Self-pay | Admitting: Internal Medicine

## 2023-09-10 VITALS — BP 128/70 | HR 76 | Ht 67.0 in | Wt 134.6 lb

## 2023-09-10 DIAGNOSIS — D539 Nutritional anemia, unspecified: Secondary | ICD-10-CM | POA: Diagnosis not present

## 2023-09-10 DIAGNOSIS — N400 Enlarged prostate without lower urinary tract symptoms: Secondary | ICD-10-CM | POA: Diagnosis not present

## 2023-09-10 DIAGNOSIS — E782 Mixed hyperlipidemia: Secondary | ICD-10-CM | POA: Diagnosis not present

## 2023-09-10 DIAGNOSIS — E559 Vitamin D deficiency, unspecified: Secondary | ICD-10-CM

## 2023-09-10 DIAGNOSIS — K219 Gastro-esophageal reflux disease without esophagitis: Secondary | ICD-10-CM | POA: Diagnosis not present

## 2023-09-10 DIAGNOSIS — I10 Essential (primary) hypertension: Secondary | ICD-10-CM

## 2023-09-10 MED ORDER — TAMSULOSIN HCL 0.4 MG PO CAPS: 0.4000 mg | ORAL_CAPSULE | Freq: Every day | ORAL | 3 refills | Status: AC

## 2023-09-10 MED ORDER — PRAVASTATIN SODIUM 80 MG PO TABS: 80.0000 mg | ORAL_TABLET | Freq: Every day | ORAL | 3 refills | Status: AC

## 2023-09-10 MED ORDER — EZETIMIBE 10 MG PO TABS: 10.0000 mg | ORAL_TABLET | Freq: Every day | ORAL | 3 refills | Status: DC

## 2023-09-10 MED ORDER — AMLODIPINE BESYLATE 10 MG PO TABS: 10.0000 mg | ORAL_TABLET | Freq: Every day | ORAL | 3 refills | Status: AC

## 2023-09-10 MED ORDER — METOPROLOL TARTRATE 25 MG PO TABS: 12.5000 mg | ORAL_TABLET | Freq: Two times a day (BID) | ORAL | 3 refills | Status: DC

## 2023-09-10 NOTE — Assessment & Plan Note (Signed)
 Lipid panel last updated in September 2024.  Total cholesterol 195 and LDL 99.  He is currently prescribed pravastatin  80 mg daily and Zetia  10 mg daily.  Repeat lipid panel ordered today.

## 2023-09-10 NOTE — Patient Instructions (Signed)
 It was a pleasure to see you today.  Thank you for giving [place] the opportunity to be involved in your care.  Below is a brief recap of your visit and next steps.  We will plan to see you again in 6 months.  Summary No medication changes today Refills provided Repeat labs ordered Follow up in 6 months

## 2023-09-10 NOTE — Assessment & Plan Note (Signed)
 Noted on previous labs.  Repeat vitamin D level ordered today

## 2023-09-10 NOTE — Assessment & Plan Note (Signed)
 Symptoms remain well-controlled with Flomax .  Refill provided today.

## 2023-09-10 NOTE — Progress Notes (Signed)
 Established Patient Office Visit  Subjective   Patient ID: Kyle Rivers, male    DOB: May 26, 1955  Age: 68 y.o. MRN: [id number]  Chief Complaint  Patient presents with   Care Management    Six month follow up    Kyle Rivers returns today for routine follow-up.  He was last evaluated by me in September 2024.  No medication changes were made at that time, repeat labs ordered, and 73-month follow-up arranged.  In the interim, he has been seen by neurosurgery in the setting of lumbar back pain with a history of cervical spine surgery.  Fortunately, his symptoms improved with physical therapy.  He has also been seen by cardiology for follow-up and underwent cardiac MRI for suspected hypertrophic cardiomyopathy.  Testing showed LVH but did not meet criteria for HoCM.  Kyle Rivers reports feeling well today.  He is asymptomatic and has no acute concerns to discuss.  Past Medical History:  Diagnosis Date   BPH (benign prostatic hypertrophy)    DJD (degenerative joint disease)    in lower back    Ear infection 08/12/2016   pt believes he may be coming down with an ear infection today   Hemorrhoids    Hyperlipidemia    Hypertension    LVH (left ventricular hypertrophy)    LVH variant, EKG show ST elevation, normal cardiac catherization 2009   Prostatitis    Past Surgical History:  Procedure Laterality Date   ANTERIOR CERVICAL DECOMP/DISCECTOMY FUSION N/A 08/12/2016   Procedure: C5-6, C6-7 ANTERIOR CERVICAL DECOMPRESSION/DISCECTOMY FUSION 2 LEVELS, ALLOGRAFT, PLATE;  Surgeon: Adah Acron, MD;  Location: [facility];  Service: Orthopedics;  Laterality: N/A;   CARDIAC CATHETERIZATION     CERVICAL DISC SURGERY  08/13/2015   c 5 c6 c7   COLONOSCOPY     COLONOSCOPY N/A 10/02/2016   Procedure: COLONOSCOPY;  Surgeon: Ruby Corporal, MD;  [facility];  Service: Endoscopy;  Laterality: N/A;  12:45   COLONOSCOPY WITH PROPOFOL  N/A 12/24/2021   Procedure: COLONOSCOPY WITH PROPOFOL ;  Surgeon:  Urban Garden, MD;  [facility];  Service: Gastroenterology;  Laterality: N/A;  730   dental inplants     HEMORRHOID SURGERY     POLYPECTOMY  10/02/2016   Procedure: POLYPECTOMY;  Surgeon: Ruby Corporal, MD;  [facility];  Service: Endoscopy;;  colon   POLYPECTOMY  12/24/2021   Procedure: POLYPECTOMY;  Surgeon: Urban Garden, MD;  [facility];  Service: Gastroenterology;;   UPPER GASTROINTESTINAL ENDOSCOPY     Social History   Tobacco Use   Smoking status: Former    Current packs/day: 0.50    Average packs/day: 0.5 packs/day for 30.0 years (15.0 ttl pk-yrs)    Types: Cigarettes    Passive exposure: Never   Smokeless tobacco: Former  Building services engineer status: Never Used  Substance Use Topics   Alcohol use: Yes    Comment: Occ   Drug use: No   Family History  Problem Relation Age of Onset   Stroke Mother    Hypertension Father    Diabetes Father    Prostate cancer Father    Diabetes Sister    Diabetes Sister    Allergies  Allergen Reactions   Atorvastatin  Other (See Comments) and Rash    "Bumps showed up on skin "   Oxycodone  Nausea Only and Other (See Comments)    " makes me sick with nausea "   Review  of Systems  Constitutional:  Negative for chills and fever.  HENT:  Negative for sore throat.   Respiratory:  Negative for cough and shortness of breath.   Cardiovascular:  Negative for chest pain, palpitations and leg swelling.  Gastrointestinal:  Negative for abdominal pain, blood in stool, constipation, diarrhea, nausea and vomiting.  Genitourinary:  Negative for dysuria and hematuria.  Musculoskeletal:  Negative for myalgias.  Skin:  Negative for itching and rash.  Neurological:  Negative for dizziness and headaches.  Psychiatric/Behavioral:  Negative for depression and suicidal ideas.       Objective:     BP 128/70   Pulse 76   Ht 5\' 7"  (1.702 m)   Wt 134 lb 9.6 oz (61.1 kg)   SpO2 95%    BMI 21.08 kg/m  BP Readings from Last 3 Encounters:  09/10/23 128/70  06/25/23 135/85  02/24/23 (!) 138/92   Physical Exam Vitals reviewed.  Constitutional:      General: He is not in acute distress.    Appearance: Normal appearance. He is not ill-appearing.  HENT:     Head: Normocephalic and atraumatic.     Right Ear: External ear normal.     Left Ear: External ear normal.     Nose: Nose normal. No congestion or rhinorrhea.     Mouth/Throat:     Mouth: Mucous membranes are moist.     Pharynx: Oropharynx is clear.  Eyes:     General: No scleral icterus.    Extraocular Movements: Extraocular movements intact.     Conjunctiva/sclera: Conjunctivae normal.     Pupils: Pupils are equal, round, and reactive to light.  Cardiovascular:     Rate and Rhythm: Normal rate and regular rhythm.     Pulses: Normal pulses.     Heart sounds: Normal heart sounds. No murmur heard. Pulmonary:     Effort: Pulmonary effort is normal.     Breath sounds: Normal breath sounds. No wheezing, rhonchi or rales.  Abdominal:     General: Abdomen is flat. Bowel sounds are normal. There is no distension.     Palpations: Abdomen is soft.     Tenderness: There is no abdominal tenderness.  Musculoskeletal:        General: No swelling or deformity. Normal range of motion.     Cervical back: Normal range of motion.  Skin:    General: Skin is warm and dry.     Capillary Refill: Capillary refill takes less than 2 seconds.  Neurological:     General: No focal deficit present.     Mental Status: He is alert and oriented to person, place, and time.     Motor: No weakness.  Psychiatric:        Mood and Affect: Mood normal.        Behavior: Behavior normal.        Thought Content: Thought content normal.     Last CBC Lab Results  Component Value Date   WBC 6.9 06/29/2022   HGB 14.9 08/20/2023   HCT 43.2 08/20/2023   MCV 95.6 06/29/2022   MCH 33.0 06/29/2022   RDW 13.2 06/29/2022   PLT 180 06/29/2022    Last metabolic panel Lab Results  Component Value Date   GLUCOSE 100 (H) 06/29/2022   NA 136 06/29/2022   K 3.9 06/29/2022   CL 101 06/29/2022   CO2 26 06/29/2022   BUN 14 06/29/2022   CREATININE 1.13 06/29/2022   GFRNONAA >60 06/29/2022  CALCIUM  9.2 06/29/2022   PROT 8.0 06/29/2022   ALBUMIN 3.9 06/29/2022   LABGLOB 3.6 07/20/2019   AGRATIO 1.3 07/20/2019   BILITOT 0.8 06/29/2022   ALKPHOS 84 06/29/2022   AST 31 06/29/2022   ALT 39 06/29/2022   ANIONGAP 9 06/29/2022   Last lipids Lab Results  Component Value Date   CHOL 195 01/28/2023   HDL 72 01/28/2023   LDLCALC 99 01/28/2023   TRIG 137 01/28/2023   CHOLHDL 2.7 01/28/2023   Last hemoglobin A1c Lab Results  Component Value Date   HGBA1C 5.1 06/29/2022   Last thyroid  functions Lab Results  Component Value Date   TSH 3.466 06/29/2022   Last vitamin D  Lab Results  Component Value Date   VD25OH 29.0 (L) 01/28/2023   Last vitamin B12 and Folate Lab Results  Component Value Date   VITAMINB12 1,695 (H) 07/20/2019   FOLATE >20.0 07/20/2019   The 10-year ASCVD risk score (Arnett DK, et al., 2019) is: 15.4%    Assessment & Plan:   Problem List Items Addressed This Visit       Essential hypertension - Primary   Remains adequate control on current antihypertensive medication regimen.  Refills provided today.      Benign prostatic hyperplasia   Symptoms remain well-controlled with Flomax .  Refill provided today.      Hyperlipidemia   Lipid panel last updated in September 2024.  Total cholesterol 195 and LDL 99.  He is currently prescribed pravastatin  80 mg daily and Zetia  10 mg daily.  Repeat lipid panel ordered today.      Vitamin D  insufficiency   Noted on previous labs.  Repeat vitamin D  level ordered today.      Return in about 6 months (around 03/12/2024).   Tobi Fortes, MD

## 2023-09-10 NOTE — Assessment & Plan Note (Signed)
 Remains adequate control on current antihypertensive medication regimen.  Refills provided today.

## 2023-09-11 LAB — CBC WITH DIFFERENTIAL/PLATELET
Basophils Absolute: 0.1 10*3/uL (ref 0.0–0.2)
Basos: 1 %
EOS (ABSOLUTE): 0.1 10*3/uL (ref 0.0–0.4)
Eos: 3 %
Hematocrit: 43.8 % (ref 37.5–51.0)
Hemoglobin: 15.2 g/dL (ref 13.0–17.7)
Immature Grans (Abs): 0 10*3/uL (ref 0.0–0.1)
Immature Granulocytes: 0 %
Lymphocytes Absolute: 1.6 10*3/uL (ref 0.7–3.1)
Lymphs: 35 %
MCH: 33.4 pg — ABNORMAL HIGH (ref 26.6–33.0)
MCHC: 34.7 g/dL (ref 31.5–35.7)
MCV: 96 fL (ref 79–97)
Monocytes Absolute: 0.4 10*3/uL (ref 0.1–0.9)
Monocytes: 10 %
Neutrophils Absolute: 2.4 10*3/uL (ref 1.4–7.0)
Neutrophils: 51 %
Platelets: 205 10*3/uL (ref 150–450)
RBC: 4.55 x10E6/uL (ref 4.14–5.80)
RDW: 13.1 % (ref 11.6–15.4)
WBC: 4.6 10*3/uL (ref 3.4–10.8)

## 2023-09-11 LAB — CMP14+EGFR
ALT: 15 IU/L (ref 0–44)
AST: 24 IU/L (ref 0–40)
Albumin: 4.4 g/dL (ref 3.9–4.9)
Alkaline Phosphatase: 86 IU/L (ref 44–121)
BUN/Creatinine Ratio: 10 (ref 10–24)
BUN: 11 mg/dL (ref 8–27)
Bilirubin Total: 0.4 mg/dL (ref 0.0–1.2)
CO2: 23 mmol/L (ref 20–29)
Calcium: 9.6 mg/dL (ref 8.6–10.2)
Chloride: 102 mmol/L (ref 96–106)
Creatinine, Ser: 1.06 mg/dL (ref 0.76–1.27)
Globulin, Total: 3 g/dL (ref 1.5–4.5)
Glucose: 74 mg/dL (ref 70–99)
Potassium: 4.3 mmol/L (ref 3.5–5.2)
Sodium: 140 mmol/L (ref 134–144)
Total Protein: 7.4 g/dL (ref 6.0–8.5)
eGFR: 77 mL/min/{1.73_m2} (ref >59)

## 2023-09-11 LAB — HEMOGLOBIN A1C
Est. average glucose Bld gHb Est-mCnc: 114 mg/dL
Hgb A1c MFr Bld: 5.6 % (ref 4.8–5.6)

## 2023-09-11 LAB — LIPID PANEL
Chol/HDL Ratio: 2.5 ratio (ref 0.0–5.0)
Cholesterol, Total: 155 mg/dL (ref 100–199)
HDL: 63 mg/dL (ref >39)
LDL Chol Calc (NIH): 74 mg/dL (ref 0–99)
Triglycerides: 100 mg/dL (ref 0–149)
VLDL Cholesterol Cal: 18 mg/dL (ref 5–40)

## 2023-09-11 LAB — VITAMIN D 25 HYDROXY (VIT D DEFICIENCY, FRACTURES): Vit D, 25-Hydroxy: 53.1 ng/mL (ref 30.0–100.0)

## 2023-09-11 LAB — B12 AND FOLATE PANEL
Folate: >20 ng/mL (ref >3.0)
Vitamin B-12: 1366 pg/mL — ABNORMAL HIGH (ref 232–1245)

## 2023-09-11 LAB — TSH+FREE T4
Free T4: 0.98 ng/dL (ref 0.82–1.77)
TSH: 1.47 u[IU]/mL (ref 0.450–4.500)

## 2023-09-11 LAB — PSA: Prostate Specific Ag, Serum: 3.5 ng/mL (ref 0.0–4.0)

## 2023-09-12 ENCOUNTER — Encounter: Payer: Self-pay | Admitting: Internal Medicine

## 2023-09-27 DIAGNOSIS — H61309 Acquired stenosis of external ear canal, unspecified, unspecified ear: Secondary | ICD-10-CM | POA: Diagnosis not present

## 2023-09-27 DIAGNOSIS — H6123 Impacted cerumen, bilateral: Secondary | ICD-10-CM | POA: Diagnosis not present

## 2023-09-27 DIAGNOSIS — L689 Hypertrichosis, unspecified: Secondary | ICD-10-CM | POA: Diagnosis not present

## 2023-11-09 ENCOUNTER — Ambulatory Visit (INDEPENDENT_AMBULATORY_CARE_PROVIDER_SITE_OTHER): Payer: Medicare HMO

## 2023-11-09 VITALS — Ht 67.0 in | Wt 140.0 lb

## 2023-11-09 DIAGNOSIS — Z0000 Encounter for general adult medical examination without abnormal findings: Secondary | ICD-10-CM | POA: Diagnosis not present

## 2023-11-09 NOTE — Patient Instructions (Signed)
 Kyle Rivers ,  Thank you for taking time out of your busy schedule to complete your Annual Wellness Visit with me. I enjoyed our conversation and look forward to speaking with you again next year. I, as well as your care team,  appreciate your ongoing commitment to your health goals. Please review the following plan we discussed and let me know if I can assist you in the future.  I enjoyed our conversation and look forward to it again next year. Blessing for the upcoming year!!  -Kalimah Capurro  Your Game plan/ To Do List   Referrals Placed:  N/a Follow up Visits:  PCP IN OFFICE: March 15, 2024 at 10:40 am 1 YEAR WITH HEALTH ADVISOR: November 13, 2024 at 10:00 am video   Clinician Recommendations:  Aim for 30 minutes of exercise or brisk walking, 6-8 glasses of water , and 5 servings of fruits and vegetables each day.       This is a list of the screening recommended for you and due dates:  Health Maintenance  Topic Date Due   Pneumococcal Vaccine for age over 42 (1 of 1 - PCV) Never done   Zoster (Shingles) Vaccine (1 of 2) Never done   COVID-19 Vaccine (4 - 2024-25 season) 01/10/2023   Flu Shot  12/10/2023   Medicare Annual Wellness Visit  11/08/2024   Colon Cancer Screening  12/24/2024   DTaP/Tdap/Td vaccine (2 - Td or Tdap) 07/29/2026   Hepatitis C Screening  Completed   Hepatitis B Vaccine  Aged Out   HPV Vaccine  Aged Out   Meningitis B Vaccine  Aged Out    Advanced directives: (Declined) Advance directive discussed with you today. Even though you declined this today, please call our office should you change your mind, and we can give you the proper paperwork for you to fill out. Advance Care Planning is important because it:  [x]  Makes sure you receive the medical care that is consistent with your values, goals, and preferences  [x]  It provides guidance to your family and loved ones and reduces their decisional burden about whether or not they are making the right decisions based on  your wishes.  Follow the link provided in your after visit summary or read over the paperwork we have mailed to you to help you started getting your Advance Directives in place. If you need assistance in completing these, please reach out to us  so that we can help you!

## 2023-11-09 NOTE — Progress Notes (Signed)
 Subjective:   Kyle Rivers is a 68 y.o. who presents for a Medicare Wellness preventive visit.  As a reminder, Annual Wellness Visits don't include a physical exam, and some assessments may be limited, especially if this visit is performed virtually. We may recommend an in-person follow-up visit with your provider if needed.  Visit Complete: Virtual I connected with  Kyle Rivers on 11/09/23 by a audio enabled telemedicine application and verified that I am speaking with the correct person using two identifiers.  Patient Location: Home  Provider Location: Home Office  I discussed the limitations of evaluation and management by telemedicine. The patient expressed understanding and agreed to proceed.  Vital Signs: Because this visit was a virtual/telehealth visit, some criteria may be missing or patient reported. Any vitals not documented were not able to be obtained and vitals that have been documented are patient reported.  VideoDeclined- This patient declined Librarian, academic. Therefore the visit was completed with audio only.  Persons Participating in Visit: Patient.  AWV Questionnaire: No: Patient Medicare AWV questionnaire was not completed prior to this visit.  Cardiac Risk Factors include: advanced age (>71men, >22 women);dyslipidemia;hypertension;male gender     Objective:    Today's Vitals   11/09/23 1047  Weight: 140 lb (63.5 kg)  Height: 5' 7 (1.702 m)   Body mass index is 21.93 kg/m.     11/09/2023   10:51 AM 11/03/2022    1:41 PM 12/24/2021    6:45 AM 06/23/2019    9:47 AM 03/10/2018    5:36 PM 10/02/2016   12:25 PM 08/12/2016   10:29 AM  Advanced Directives  Does Patient Have a Medical Advance Directive? No No No No No  No  No   Would patient like information on creating a medical advance directive? No - Patient declined No - Patient declined No - Patient declined No - Patient declined No - Patient declined  No - Patient  declined  No - Patient declined      Data saved with a previous flowsheet row definition    Current Medications (verified) Outpatient Encounter Medications as of 11/09/2023  Medication Sig   acetaminophen  (TYLENOL ) 650 MG CR tablet Take 1,300 mg by mouth 2 (two) times daily.   amLODipine  (NORVASC ) 10 MG tablet Take 1 tablet (10 mg total) by mouth daily.   cyclobenzaprine  (FLEXERIL ) 5 MG tablet Take 1 tablet (5 mg total) by mouth 2 (two) times daily as needed for muscle spasms.   ezetimibe  (ZETIA ) 10 MG tablet Take 1 tablet (10 mg total) by mouth daily.   fish oil-omega-3 fatty acids 1000 MG capsule Take 2 g by mouth daily.    Menthol -Methyl Salicylate (MUSCLE RUB) 10-15 % CREA Apply 1 application topically as needed for muscle pain.   metoprolol  tartrate (LOPRESSOR ) 25 MG tablet Take 0.5 tablets (12.5 mg total) by mouth 2 (two) times daily.   OVER THE COUNTER MEDICATION See admin instructions. Genius brand Digestion Opitimizer. Take 3-4 capsules daily.   pravastatin  (PRAVACHOL ) 80 MG tablet Take 1 tablet (80 mg total) by mouth daily.   Probiotic Product (ACIDOPHILUS/BIFIDUS PO) Take 1 capsule by mouth daily.   tamsulosin  (FLOMAX ) 0.4 MG CAPS capsule Take 1 capsule (0.4 mg total) by mouth daily.   TURMERIC PO Take 1,000 mg by mouth daily.    No facility-administered encounter medications on file as of 11/09/2023.    Allergies (verified) Atorvastatin  and Oxycodone    History: Past Medical History:  Diagnosis Date  BPH (benign prostatic hypertrophy)    DJD (degenerative joint disease)    in lower back    Ear infection 08/12/2016   pt believes he may be coming down with an ear infection today   Hemorrhoids    Hyperlipidemia    Hypertension    LVH (left ventricular hypertrophy)    LVH variant, EKG show ST elevation, normal cardiac catherization 2009   Prostatitis    Past Surgical History:  Procedure Laterality Date   ANTERIOR CERVICAL DECOMP/DISCECTOMY FUSION N/A 08/12/2016    Procedure: C5-6, C6-7 ANTERIOR CERVICAL DECOMPRESSION/DISCECTOMY FUSION 2 LEVELS, ALLOGRAFT, PLATE;  Surgeon: Oneil JAYSON Herald, MD;  Location: [facility] OR;  Service: Orthopedics;  Laterality: N/A;   CARDIAC CATHETERIZATION     CERVICAL DISC SURGERY  08/13/2015   c 5 c6 c7   COLONOSCOPY     COLONOSCOPY N/A 10/02/2016   Procedure: COLONOSCOPY;  Surgeon: Golda Claudis PENNER, MD;  Location: [facility];  Service: Endoscopy;  Laterality: N/A;  12:45   COLONOSCOPY WITH PROPOFOL  N/A 12/24/2021   Procedure: COLONOSCOPY WITH PROPOFOL ;  Surgeon: Eartha Angelia Sieving, MD;  Location: [facility];  Service: Gastroenterology;  Laterality: N/A;  730   dental inplants     HEMORRHOID SURGERY     POLYPECTOMY  10/02/2016   Procedure: POLYPECTOMY;  Surgeon: Golda Claudis PENNER, MD;  Location: [facility];  Service: Endoscopy;;  colon   POLYPECTOMY  12/24/2021   Procedure: POLYPECTOMY;  Surgeon: Eartha Angelia Sieving, MD;  Location: [facility];  Service: Gastroenterology;;   UPPER GASTROINTESTINAL ENDOSCOPY     Family History  Problem Relation Age of Onset   Stroke Mother    Hypertension Father    Diabetes Father    Prostate cancer Father    Diabetes Sister    Diabetes Sister    Social History   Socioeconomic History   Marital status: Married    Spouse name: Not on file   Number of children: Not on file   Years of education: Not on file   Highest education level: Not on file  Occupational History   Not on file  Tobacco Use   Smoking status: Former    Current packs/day: 0.00    Average packs/day: 0.5 packs/day for 35.0 years (17.5 ttl pk-yrs)    Types: Cigarettes    Start date: 9    Quit date: 2015    Years since quitting: 10.5    Passive exposure: Never   Smokeless tobacco: Former  Building services engineer status: Never Used  Substance and Sexual Activity   Alcohol use: Yes    Comment: Occ   Drug use: No   Sexual activity: Yes  Other Topics Concern   Not on file  Social History  Narrative   Not on file   Social Drivers of Health   Financial Resource Strain: Low Risk  (11/09/2023)   Overall Financial Resource Strain (CARDIA)    Difficulty of Paying Living Expenses: Not hard at all  Food Insecurity: No Food Insecurity (11/09/2023)   Hunger Vital Sign    Worried About Running Out of Food in the Last Year: Never true    Ran Out of Food in the Last Year: Never true  Transportation Needs: No Transportation Needs (11/09/2023)   PRAPARE - Administrator, Civil Service (Medical): No    Lack of Transportation (Non-Medical): No  Physical Activity: Sufficiently Active (11/09/2023)   Exercise Vital Sign    Days of Exercise per Week:  7 days    Minutes of Exercise per Session: 30 min  Stress: No Stress Concern Present (11/09/2023)   [employer] of Occupational Health - Occupational Stress Questionnaire    Feeling of Stress: Not at all  Social Connections: Socially Integrated (11/09/2023)   Social Connection and Isolation Panel    Frequency of Communication with Friends and Family: More than three times a week    Frequency of Social Gatherings with Friends and Family: More than three times a week    Attends Religious Services: More than 4 times per year    Active Member of [employer] or Organizations: Yes    Attends Engineer, structural: More than 4 times per year    Marital Status: Married    Tobacco Counseling Counseling given: Yes    Clinical Intake:  Pre-visit preparation completed: Yes  Pain : No/denies pain     BMI - recorded: 21.93 Nutritional Status: BMI of 19-24  Normal Nutritional Risks: None Diabetes: No  Lab Results  Component Value Date   HGBA1C 5.6 09/10/2023   HGBA1C 5.1 06/29/2022   HGBA1C 5.3 07/20/2019     How often do you need to have someone help you when you read instructions, pamphlets, or other written materials from your doctor or pharmacy?: 1 - Never  Interpreter Needed?: No  Information entered by :: Stefano ORN  CMA   Activities of Daily Living     11/09/2023   10:51 AM  In your present state of health, do you have any difficulty performing the following activities:  Hearing? 0  Vision? 0  Difficulty concentrating or making decisions? 0  Walking or climbing stairs? 0  Dressing or bathing? 0  Doing errands, shopping? 0  Preparing Food and eating ? N  Using the Toilet? N  In the past six months, have you accidently leaked urine? N  Do you have problems with loss of bowel control? N  Managing your Medications? N  Managing your Finances? N  Housekeeping or managing your Housekeeping? N    Patient Care Team: Bevely Doffing, FNP as PCP - General (Family Medicine) Alvan Dorn FALCON, MD as Consulting Physician (Cardiology) Delfino Ade, MD as Referring Physician (General Practice) Hilma Hastings, PA-C as Physician Assistant (Neurosurgery)  I have updated your Care Teams any recent Medical Services you may have received from other providers in the past year.     Assessment:   This is a routine wellness examination for Jaylynn.  Hearing/Vision screen Hearing Screening - Comments:: Patient denies any hearing difficulties.   Vision Screening - Comments:: Patient states he doesn't have any difficulties seeing. Does not have an eye care provider and declines a referral today   Goals Addressed             This Visit's Progress    Patient Stated   On track    Patient wants to get better at playing the piano     Patient Stated       Get better at playing the guitar and speaking Spanish       Depression Screen     11/09/2023   11:05 AM 09/10/2023   11:20 AM 01/28/2023   10:38 AM 01/25/2023    9:28 AM 11/03/2022    1:38 PM 07/20/2022   11:44 AM 06/17/2020   12:39 PM  PHQ 2/9 Scores  PHQ - 2 Score 0 0 0 0 0 0 0  PHQ- 9 Score 0 0    0  Fall Risk     11/09/2023   11:02 AM 09/10/2023   11:19 AM 01/28/2023   10:38 AM 01/25/2023    9:28 AM 11/03/2022    1:42 PM  Fall Risk   Falls in the  past year? 0 0 0 0 0  Number falls in past yr: 0 0 0 0 0  Injury with Fall? 0 0 0 0 0  Risk for fall due to : No Fall Risks No Fall Risks   No Fall Risks  Follow up Falls evaluation completed;Education provided;Falls prevention discussed Falls evaluation completed   Falls prevention discussed    MEDICARE RISK AT HOME:  Medicare Risk at Home Any stairs in or around the home?: Yes If so, are there any without handrails?: No Home free of loose throw rugs in walkways, pet beds, electrical cords, etc?: Yes Adequate lighting in your home to reduce risk of falls?: Yes Life alert?: No Use of a cane, walker or w/c?: No Grab bars in the bathroom?: Yes Shower chair or bench in shower?: No Elevated toilet seat or a handicapped toilet?: No  TIMED UP AND GO:  Was the test performed?  No  Cognitive Function: 6CIT completed        11/09/2023   11:04 AM 11/03/2022    1:42 PM  6CIT Screen  What Year? 0 points 0 points  What month? 0 points 0 points  What time? 0 points 0 points  Count back from 20 0 points 0 points  Months in reverse 0 points 0 points  Repeat phrase 0 points 0 points  Total Score 0 points 0 points    Immunizations Immunization History  Administered Date(s) Administered   Fluad Trivalent(High Dose 65+) 01/28/2023   Influenza,inj,Quad PF,6+ Mos 04/02/2014, 04/21/2019   Moderna Sars-Covid-2 Vaccination 07/17/2019, 08/15/2019, 04/25/2020   Tdap 07/28/2016    Screening Tests Health Maintenance  Topic Date Due   Pneumococcal Vaccine: 50+ Years (1 of 1 - PCV) Never done   Zoster Vaccines- Shingrix (1 of 2) Never done   COVID-19 Vaccine (4 - 2024-25 season) 01/10/2023   INFLUENZA VACCINE  12/10/2023   Medicare Annual Wellness (AWV)  11/08/2024   Colonoscopy  12/24/2024   DTaP/Tdap/Td (2 - Td or Tdap) 07/29/2026   Hepatitis C Screening  Completed   Hepatitis B Vaccines  Aged Out   HPV VACCINES  Aged Out   Meningococcal B Vaccine  Aged Out    Health  Maintenance  Health Maintenance Due  Topic Date Due   Pneumococcal Vaccine: 50+ Years (1 of 1 - PCV) Never done   Zoster Vaccines- Shingrix (1 of 2) Never done   COVID-19 Vaccine (4 - 2024-25 season) 01/10/2023   Health Maintenance Items Addressed: Patient advised of recommended vaccines and where to obtain those vaccines with verbal understanding  Additional Screening:  Vision Screening: Recommended annual ophthalmology exams for early detection of glaucoma and other disorders of the eye. Would you like a referral to an eye doctor? No    Dental Screening: Recommended annual dental exams for proper oral hygiene  Community Resource Referral / Chronic Care Management: CRR required this visit?  No   CCM required this visit?  No   Plan:    I have personally reviewed and noted the following in the patient's chart:   Medical and social history Use of alcohol, tobacco or illicit drugs  Current medications and supplements including opioid prescriptions. Patient is not currently taking opioid prescriptions. Functional ability and status Nutritional status  Physical activity Advanced directives List of other physicians Hospitalizations, surgeries, and ER visits in previous 12 months Vitals Screenings to include cognitive, depression, and falls Referrals and appointments  In addition, I have reviewed and discussed with patient certain preventive protocols, quality metrics, and best practice recommendations. A written personalized care plan for preventive services as well as general preventive health recommendations were provided to patient.   Noriel Guthrie, CMA   11/09/2023   After Visit Summary: (MyChart) Due to this being a telephonic visit, the after visit summary with patients personalized plan was offered to patient via MyChart   Notes: Nothing significant to report at this time.

## 2024-01-01 ENCOUNTER — Other Ambulatory Visit: Payer: Self-pay | Admitting: Internal Medicine

## 2024-01-01 DIAGNOSIS — E782 Mixed hyperlipidemia: Secondary | ICD-10-CM

## 2024-02-05 ENCOUNTER — Other Ambulatory Visit: Payer: Self-pay

## 2024-02-05 DIAGNOSIS — E782 Mixed hyperlipidemia: Secondary | ICD-10-CM

## 2024-02-21 DIAGNOSIS — M519 Unspecified thoracic, thoracolumbar and lumbosacral intervertebral disc disorder: Secondary | ICD-10-CM | POA: Diagnosis not present

## 2024-02-21 DIAGNOSIS — E785 Hyperlipidemia, unspecified: Secondary | ICD-10-CM | POA: Diagnosis not present

## 2024-02-21 DIAGNOSIS — N400 Enlarged prostate without lower urinary tract symptoms: Secondary | ICD-10-CM | POA: Diagnosis not present

## 2024-02-21 DIAGNOSIS — I499 Cardiac arrhythmia, unspecified: Secondary | ICD-10-CM | POA: Diagnosis not present

## 2024-02-21 DIAGNOSIS — M5430 Sciatica, unspecified side: Secondary | ICD-10-CM | POA: Diagnosis not present

## 2024-02-21 DIAGNOSIS — M4800 Spinal stenosis, site unspecified: Secondary | ICD-10-CM | POA: Diagnosis not present

## 2024-02-21 DIAGNOSIS — Z8249 Family history of ischemic heart disease and other diseases of the circulatory system: Secondary | ICD-10-CM | POA: Diagnosis not present

## 2024-02-21 DIAGNOSIS — I10 Essential (primary) hypertension: Secondary | ICD-10-CM | POA: Diagnosis not present

## 2024-02-21 DIAGNOSIS — K219 Gastro-esophageal reflux disease without esophagitis: Secondary | ICD-10-CM | POA: Diagnosis not present

## 2024-03-12 ENCOUNTER — Other Ambulatory Visit: Payer: Self-pay

## 2024-03-12 DIAGNOSIS — E782 Mixed hyperlipidemia: Secondary | ICD-10-CM

## 2024-03-15 ENCOUNTER — Ambulatory Visit

## 2024-03-15 VITALS — BP 159/73 | HR 94 | Ht 67.0 in | Wt 134.0 lb

## 2024-03-15 DIAGNOSIS — K219 Gastro-esophageal reflux disease without esophagitis: Secondary | ICD-10-CM

## 2024-03-15 DIAGNOSIS — Z23 Encounter for immunization: Secondary | ICD-10-CM

## 2024-03-15 DIAGNOSIS — E782 Mixed hyperlipidemia: Secondary | ICD-10-CM | POA: Diagnosis not present

## 2024-03-15 DIAGNOSIS — E559 Vitamin D deficiency, unspecified: Secondary | ICD-10-CM

## 2024-03-15 DIAGNOSIS — I10 Essential (primary) hypertension: Secondary | ICD-10-CM | POA: Diagnosis not present

## 2024-03-15 NOTE — Progress Notes (Signed)
 Established Patient Office Visit  Subjective   Patient ID: Kyle Rivers, male    DOB: 1955/09/29  Age: 68 y.o. MRN: [id number]  Chief Complaint  Patient presents with   Medical Management of Chronic Issues    6 month follow up     HPI  Discussed the use of AI scribe software for clinical note transcription with the patient, who gave verbal consent to proceed.  History of Present Illness   [name] is a 68 year old male who presents for a follow-up visit.  Blood pressure evaluation - Systolic blood pressure measured at 139 mmHg prior to appointment, attributed to rushing to the clinic - Blood pressure is usually within normal range  Peripheral edema - Swelling present in both legs  General health status - No significant changes in health status since last visit - Overall feeling of well-being - Ate breakfast prior to appointment  Medication management - Typically calls in for medication refills or pharmacy contacts clinic directly - No specific medications listed       Patient Active Problem List   Diagnosis Date Noted   Vitamin D  deficiency 02/03/2023   Need for influenza vaccination 02/03/2023   Lumbar radiculopathy 01/25/2023   GERD (gastroesophageal reflux disease) 07/20/2022   Encounter for general adult medical examination with abnormal findings 07/20/2022   Bulge of lumbar disc without myelopathy 10/25/2019   Statin intolerance 09/05/2019   Aortic atherosclerosis 05/01/2019   Cervical spinal stenosis 08/12/2016   Carotid artery disease 07/28/2016   Spinal stenosis of cervical region 07/27/2016   Spondylosis without myelopathy or radiculopathy, cervical region 07/27/2016   Benign prostatic hyperplasia 07/31/2013   ED (erectile dysfunction) 11/29/2012   Hyperlipidemia 04/05/2006   Essential hypertension 04/05/2006   Cardiac dysrhythmia 04/05/2006   LOW BACK PAIN 04/05/2006      ROS    Objective:     BP (!) 159/73   Pulse 94   Ht 5'  7 (1.702 m)   Wt 134 lb (60.8 kg)   SpO2 95%   BMI 20.99 kg/m  BP Readings from Last 3 Encounters:  03/15/24 (!) 159/73  09/10/23 128/70  06/25/23 135/85   Wt Readings from Last 3 Encounters:  03/15/24 134 lb (60.8 kg)  11/09/23 140 lb (63.5 kg)  09/10/23 134 lb 9.6 oz (61.1 kg)      Physical Exam Vitals and nursing note reviewed.  Constitutional:      Appearance: Normal appearance.  HENT:     Head: Normocephalic.     Right Ear: Tympanic membrane, ear canal and external ear normal.     Left Ear: Tympanic membrane, ear canal and external ear normal.     Nose: Nose normal.     Mouth/Throat:     Mouth: Mucous membranes are moist.     Pharynx: Oropharynx is clear.  Cardiovascular:     Rate and Rhythm: Normal rate and regular rhythm.  Pulmonary:     Effort: Pulmonary effort is normal.     Breath sounds: Normal breath sounds.  Musculoskeletal:     Cervical back: Normal range of motion and neck supple.  Skin:    General: Skin is warm and dry.  Neurological:     Mental Status: He is alert and oriented to person, place, and time.  Psychiatric:        Mood and Affect: Mood normal.        Thought Content: Thought content normal.      No results found  for any visits on 03/15/24.  Last CBC Lab Results  Component Value Date   WBC 4.6 09/10/2023   HGB 15.2 09/10/2023   HCT 43.8 09/10/2023   MCV 96 09/10/2023   MCH 33.4 (H) 09/10/2023   RDW 13.1 09/10/2023   PLT 205 09/10/2023   Last metabolic panel Lab Results  Component Value Date   GLUCOSE 74 09/10/2023   NA 140 09/10/2023   K 4.3 09/10/2023   CL 102 09/10/2023   CO2 23 09/10/2023   BUN 11 09/10/2023   CREATININE 1.06 09/10/2023   EGFR 77 09/10/2023   CALCIUM  9.6 09/10/2023   PROT 7.4 09/10/2023   ALBUMIN 4.4 09/10/2023   LABGLOB 3.0 09/10/2023   AGRATIO 1.3 07/20/2019   BILITOT 0.4 09/10/2023   ALKPHOS 86 09/10/2023   AST 24 09/10/2023   ALT 15 09/10/2023   ANIONGAP 9 06/29/2022   Last  lipids Lab Results  Component Value Date   CHOL 155 09/10/2023   HDL 63 09/10/2023   LDLCALC 74 09/10/2023   TRIG 100 09/10/2023   CHOLHDL 2.5 09/10/2023   Last hemoglobin A1c Lab Results  Component Value Date   HGBA1C 5.6 09/10/2023   Last thyroid  functions Lab Results  Component Value Date   TSH 1.470 09/10/2023   FREET4 0.98 09/10/2023   Last vitamin D  Lab Results  Component Value Date   VD25OH 53.1 09/10/2023   Last vitamin B12 and Folate Lab Results  Component Value Date   VITAMINB12 1,366 (H) 09/10/2023   FOLATE >20.0 09/10/2023      The 10-year ASCVD risk score (Arnett DK, et al., 2019) is: 21.9%    Assessment & Plan:   Problem List Items Addressed This Visit       Cardiovascular and Mediastinum   Essential hypertension - Primary   Remains adequate control on current antihypertensive medication regimen.  Refills provided today.      Relevant Orders   CBC with Differential/Platelet   CMP14+EGFR   Lipid panel   Hemoglobin A1c   TSH + free T4     Digestive   GERD (gastroesophageal reflux disease)   Continue Pantoprazole       Relevant Orders   CBC with Differential/Platelet   CMP14+EGFR   Lipid panel   Hemoglobin A1c   TSH + free T4     Other   Hyperlipidemia   He is currently prescribed pravastatin  80 mg daily and Zetia  10 mg daily.  Repeat lipid panel ordered today.      Relevant Orders   Lipid panel   Hemoglobin A1c   TSH + free T4   Vitamin D  deficiency   Noted on previous labs.  Repeat vitamin D  level ordered today.      Relevant Orders   VITAMIN D  25 Hydroxy (Vit-D Deficiency, Fractures)   Other Visit Diagnoses       Encounter for immunization       Relevant Orders   Flu vaccine HIGH DOSE PF(Fluzone Trivalent) (Completed)      No follow-ups on file.    Leita Longs, FNP

## 2024-03-20 NOTE — Assessment & Plan Note (Signed)
 Remains adequate control on current antihypertensive medication regimen.  Refills provided today.

## 2024-03-20 NOTE — Assessment & Plan Note (Signed)
 Continue Pantoprazole

## 2024-03-20 NOTE — Assessment & Plan Note (Signed)
 Noted on previous labs.  Repeat vitamin D level ordered today

## 2024-03-20 NOTE — Assessment & Plan Note (Signed)
 He is currently prescribed pravastatin  80 mg daily and Zetia  10 mg daily.  Repeat lipid panel ordered today.

## 2024-03-23 DIAGNOSIS — E559 Vitamin D deficiency, unspecified: Secondary | ICD-10-CM | POA: Diagnosis not present

## 2024-03-23 DIAGNOSIS — K219 Gastro-esophageal reflux disease without esophagitis: Secondary | ICD-10-CM | POA: Diagnosis not present

## 2024-03-24 LAB — HEMOGLOBIN A1C
Est. average glucose Bld gHb Est-mCnc: 111 mg/dL
Hgb A1c MFr Bld: 5.5 % (ref 4.8–5.6)

## 2024-03-24 LAB — CBC WITH DIFFERENTIAL/PLATELET
Basophils Absolute: 0.1 x10E3/uL (ref 0.0–0.2)
Basos: 1 %
EOS (ABSOLUTE): 0.1 x10E3/uL (ref 0.0–0.4)
Eos: 2 %
Hematocrit: 43.2 % (ref 37.5–51.0)
Hemoglobin: 14.8 g/dL (ref 13.0–17.7)
Immature Grans (Abs): 0 x10E3/uL (ref 0.0–0.1)
Immature Granulocytes: 0 %
Lymphocytes Absolute: 1.9 x10E3/uL (ref 0.7–3.1)
Lymphs: 34 %
MCH: 33.4 pg — ABNORMAL HIGH (ref 26.6–33.0)
MCHC: 34.3 g/dL (ref 31.5–35.7)
MCV: 98 fL — ABNORMAL HIGH (ref 79–97)
Monocytes Absolute: 0.4 x10E3/uL (ref 0.1–0.9)
Monocytes: 8 %
Neutrophils Absolute: 3.1 x10E3/uL (ref 1.4–7.0)
Neutrophils: 55 %
Platelets: 159 x10E3/uL (ref 150–450)
RBC: 4.43 x10E6/uL (ref 4.14–5.80)
RDW: 12.7 % (ref 11.6–15.4)
WBC: 5.6 x10E3/uL (ref 3.4–10.8)

## 2024-03-24 LAB — CMP14+EGFR
ALT: 15 IU/L (ref 0–44)
AST: 19 IU/L (ref 0–40)
Albumin: 4.1 g/dL (ref 3.9–4.9)
Alkaline Phosphatase: 82 IU/L (ref 47–123)
BUN/Creatinine Ratio: 7 — ABNORMAL LOW (ref 10–24)
BUN: 9 mg/dL (ref 8–27)
Bilirubin Total: 0.5 mg/dL (ref 0.0–1.2)
CO2: 24 mmol/L (ref 20–29)
Calcium: 9.5 mg/dL (ref 8.6–10.2)
Chloride: 104 mmol/L (ref 96–106)
Creatinine, Ser: 1.27 mg/dL (ref 0.76–1.27)
Globulin, Total: 3 g/dL (ref 1.5–4.5)
Glucose: 93 mg/dL (ref 70–99)
Potassium: 4.5 mmol/L (ref 3.5–5.2)
Sodium: 141 mmol/L (ref 134–144)
Total Protein: 7.1 g/dL (ref 6.0–8.5)
eGFR: 62 mL/min/1.73 (ref >59)

## 2024-03-24 LAB — LIPID PANEL
Chol/HDL Ratio: 2.4 ratio (ref 0.0–5.0)
Cholesterol, Total: 157 mg/dL (ref 100–199)
HDL: 65 mg/dL (ref >39)
LDL Chol Calc (NIH): 82 mg/dL (ref 0–99)
Triglycerides: 49 mg/dL (ref 0–149)
VLDL Cholesterol Cal: 10 mg/dL (ref 5–40)

## 2024-03-24 LAB — TSH+FREE T4
Free T4: 1.03 ng/dL (ref 0.82–1.77)
TSH: 1.75 u[IU]/mL (ref 0.450–4.500)

## 2024-03-24 LAB — VITAMIN D 25 HYDROXY (VIT D DEFICIENCY, FRACTURES): Vit D, 25-Hydroxy: 31.8 ng/mL (ref 30.0–100.0)

## 2024-03-28 DIAGNOSIS — L689 Hypertrichosis, unspecified: Secondary | ICD-10-CM | POA: Diagnosis not present

## 2024-03-28 DIAGNOSIS — H6123 Impacted cerumen, bilateral: Secondary | ICD-10-CM | POA: Diagnosis not present

## 2024-04-11 ENCOUNTER — Other Ambulatory Visit: Payer: Self-pay

## 2024-04-11 DIAGNOSIS — E782 Mixed hyperlipidemia: Secondary | ICD-10-CM

## 2024-04-17 ENCOUNTER — Ambulatory Visit: Payer: Self-pay

## 2024-04-17 NOTE — Telephone Encounter (Signed)
 FYI Only or Action Required?: Action required by provider: clinical question for provider and update on patient condition.  Patient was last seen in primary care on 03/15/2024 by Bevely Doffing, FNP.  Called Nurse Triage reporting Abdominal Pain.  Symptoms began a week ago.  Interventions attempted: OTC medications: tylenol .  Symptoms are: unchanged.  Triage Disposition: See Physician Within 24 Hours  Patient/caregiver understands and will follow disposition?: No, wishes to speak with PCP   Copied from CRM #[id number]. Topic: Clinical - Red Word Triage >> Apr 17, 2024  3:28 PM Kyle Rivers wrote: Red Word that prompted transfer to Nurse Triage: right side lower stomach pain   ----------------------------------------------------------------------- From previous Reason for Contact - Scheduling: Patient/patient representative is calling to schedule an appointment. Refer to attachments for appointment information. Reason for Disposition  [1] MODERATE pain (e.g., interferes with normal activities) AND [2] pain comes and goes (cramps) AND [3] present > 24 hours  (Exception: Pain with Vomiting or Diarrhea - see that Guideline.)  Answer Assessment - Initial Assessment Questions Pt called in stating he has recurrent R abdominal pain x 1 week. He reports pain at 3/10, not radiating and relieved with tylenol . Pt states he is concerned because he has a hx of diverticulitis and he wasn't sure if he would need abx. Discussed appt with alternative provider d/t office closer and lack of availability. Pt states pain level is mild and would prefer to wait to hear directly from PCP. If sending abx, request medication be sent to [facility]. Pt educated on red flag symptoms of worsening abdominal pain, especially with pain being R-sided, fever, change in bowel or bladder. Pt voiced understanding. Please advise.    1. LOCATION: Where does it hurt?      R lower abdomen   2. RADIATION: Does the pain shoot  anywhere else? (e.g., chest, back)     No   3. ONSET: When did the pain begin? (Minutes, hours or days ago)      Last week; started to go away so he thought it was exercise related then today started to feel pain again.   4. SUDDEN: Gradual or sudden onset?     Gradual   5. PATTERN Does the pain come and go, or is it constant?     Comes and goes   6. SEVERITY: How bad is the pain?  (e.g., Scale 1-10; mild, moderate, or severe)     3/10  7. RECURRENT SYMPTOM: Have you ever had this type of stomach pain before? If Yes, ask: When was the last time? and What happened that time?      Yes; pt reports having diverticulitis a couple years ago and needing abx. Pt states he's not sure if it is the same thing but feels like it  8. CAUSE: What do you think is causing the stomach pain? (e.g., gallstones, recent abdominal surgery)     Unsure; suspects diverticulitis   9. RELIEVING/AGGRAVATING FACTORS: What makes it better or worse? (e.g., antacids, bending or twisting motion, bowel movement)     Working out makes pain come back   10. OTHER SYMPTOMS: Do you have any other symptoms? (e.g., back pain, diarrhea, fever, urination pain, vomiting)       None  Protocols used: Abdominal Pain - Male-A-AH

## 2024-04-18 NOTE — Telephone Encounter (Signed)
Scheduled with Dr. Patel tomorrow

## 2024-04-18 NOTE — Telephone Encounter (Signed)
 He wasn't having abdominal pain when I saw him on 11/05.  Recommend scheduling OV or going to UC if pain continues.

## 2024-04-19 ENCOUNTER — Ambulatory Visit: Admitting: Internal Medicine

## 2024-04-20 ENCOUNTER — Ambulatory Visit (INDEPENDENT_AMBULATORY_CARE_PROVIDER_SITE_OTHER)

## 2024-04-20 ENCOUNTER — Ambulatory Visit: Payer: Self-pay

## 2024-04-20 VITALS — BP 134/86 | HR 88 | Ht 67.0 in | Wt 133.1 lb

## 2024-04-20 DIAGNOSIS — K5790 Diverticulosis of intestine, part unspecified, without perforation or abscess without bleeding: Secondary | ICD-10-CM

## 2024-04-20 DIAGNOSIS — I10 Essential (primary) hypertension: Secondary | ICD-10-CM

## 2024-04-20 NOTE — Progress Notes (Signed)
 Established Patient Office Visit  Subjective   Patient ID: Kyle Rivers, male    DOB: Apr 10, 1956  Age: 68 y.o. MRN: [id number]  Chief Complaint  Patient presents with   Medical Management of Chronic Issues    Pt here due to Abdominal Pain for about a month states he really feels it after exercising     HPI Discussed the use of AI scribe software for clinical note transcription with the patient, who gave verbal consent to proceed.  History of Present Illness    [name] is a 68 year old male with diverticulosis who presents with abdominal pain.  Abdominal pain - Abdominal pain present for over one month - Onset after physical activity at home in [place]  - Pain initially improved but recurs with certain exercises, especially Tai Chi involving stretching movements - Pain is sharp and located in the same area as previous episodes of diverticulosis - No associated bulging or other symptoms - Bowel movements are regular  Dietary modifications - Avoids beef and pork due to diverticulosis - Consumes limited amounts of chicken  Past episodes of diverticulosis - Previous episode in 2020 treated with antibiotics - Current pain is in the same general area as prior episode     Patient Active Problem List   Diagnosis Date Noted   Diverticulosis 04/20/2024   Vitamin D  deficiency 02/03/2023   Need for influenza vaccination 02/03/2023   Lumbar radiculopathy 01/25/2023   GERD (gastroesophageal reflux disease) 07/20/2022   Encounter for general adult medical examination with abnormal findings 07/20/2022   Bulge of lumbar disc without myelopathy 10/25/2019   Statin intolerance 09/05/2019   Aortic atherosclerosis 05/01/2019   Cervical spinal stenosis 08/12/2016   Carotid artery disease 07/28/2016   Spinal stenosis of cervical region 07/27/2016   Spondylosis without myelopathy or radiculopathy, cervical region 07/27/2016   Benign prostatic hyperplasia 07/31/2013   ED  (erectile dysfunction) 11/29/2012   Hyperlipidemia 04/05/2006   Essential hypertension 04/05/2006   Cardiac dysrhythmia 04/05/2006   LOW BACK PAIN 04/05/2006    ROS    Objective:     BP 134/86 (BP Location: Left Arm, Patient Position: Sitting, Cuff Size: Normal)   Pulse 88   Ht 5' 7 (1.702 m)   Wt 133 lb 1.3 oz (60.4 kg)   SpO2 96%   BMI 20.84 kg/m  BP Readings from Last 3 Encounters:  04/20/24 134/86  03/15/24 (!) 159/73  09/10/23 128/70   Wt Readings from Last 3 Encounters:  04/20/24 133 lb 1.3 oz (60.4 kg)  03/15/24 134 lb (60.8 kg)  11/09/23 140 lb (63.5 kg)      Physical Exam Vitals and nursing note reviewed.  Constitutional:      Appearance: Normal appearance.  HENT:     Head: Normocephalic.  Eyes:     Extraocular Movements: Extraocular movements intact.     Pupils: Pupils are equal, round, and reactive to light.  Cardiovascular:     Rate and Rhythm: Normal rate and regular rhythm.  Pulmonary:     Effort: Pulmonary effort is normal.     Breath sounds: Normal breath sounds.  Abdominal:     General: Bowel sounds are normal.     Palpations: Abdomen is soft.     Tenderness: There is abdominal tenderness in the right lower quadrant. There is no right CVA tenderness or left CVA tenderness.  Musculoskeletal:     Cervical back: Normal range of motion and neck supple.  Neurological:  Mental Status: He is alert and oriented to person, place, and time.  Psychiatric:        Mood and Affect: Mood normal.        Thought Content: Thought content normal.    No results found for any visits on 04/20/24.    The 10-year ASCVD risk score (Arnett DK, et al., 2019) is: 16.2%    Assessment & Plan:   Problem List Items Addressed This Visit       Cardiovascular and Mediastinum   Essential hypertension   Blood pressure slightly elevated at 134/86 mmHg. Uncertain if antihypertensive medication was taken prior to visit. Home readings typically better. - Ensure  adherence to antihypertensive medication regimen.        Digestive   Diverticulosis - Primary   Intermittent sharp abdominal pain suggests possible recurrence of diverticulosis. - Prescribed Augmentin twice daily for one week. - Advised high fiber diet and adequate hydration. - Recommended fiber supplements like Metamucil or FiberCon if dietary intake is insufficient. - Instructed to avoid fatty foods. - Will consider imaging if symptoms do not improve.      Relevant Medications   amoxicillin-clavulanate (AUGMENTIN) 875-125 MG tablet    No follow-ups on file.    Leita Longs, FNP

## 2024-04-20 NOTE — Assessment & Plan Note (Signed)
 Intermittent sharp abdominal pain suggests possible recurrence of diverticulosis. - Prescribed Augmentin twice daily for one week. - Advised high fiber diet and adequate hydration. - Recommended fiber supplements like Metamucil or FiberCon if dietary intake is insufficient. - Instructed to avoid fatty foods. - Will consider imaging if symptoms do not improve.

## 2024-04-20 NOTE — Assessment & Plan Note (Signed)
 Blood pressure slightly elevated at 134/86 mmHg. Uncertain if antihypertensive medication was taken prior to visit. Home readings typically better. - Ensure adherence to antihypertensive medication regimen.

## 2024-04-23 NOTE — Progress Notes (Deleted)
 Cardiology Office Note:  .   Date:  04/23/2024  ID:  Kyle Rivers, DOB [birth year], MRN [id number] PCP: Bevely Doffing, FNP  [facility] Providers Cardiologist:  None { Click to update primary MD,subspecialty MD or APP then REFRESH:1}   History of Present Illness: .   Kyle Rivers is a 68 y.o. male  with PMHx of SVT/NSVT, LVH, Aortic Atherosclerosis, Carotid Stenosis, Right distal common iliac artery aneurysm, HTN, HLD who reports to Kyle Rivers office for 6 month follow up.  Pertinent cardiac medical history:  HTN  Diagnosed since 68 years old LVH ECHO 03/2018: EF 60-65%, moderate LVH, G1DD, mild MV/TV regurgitation  ECHO 07/2023: EF 60-65%, Intracavitary gradient of 12 mmHg at rest and increased to 50 mmHg with Valsalva, severe LVH, G1DD, elevated LVEDP  Cardiac MRI 08/2023: moderate LVH with up to 14 mm in basal septum, not consistent with HOCM or myocardial scar.  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 Last seen in 06/2022 with f/u planned in 2026.  Relevant Cardiac Studies  Exercise Myoview  2019: normal, no evidence of ischemia or scar   Last seen in heartcare 06/2023 by Dr. Alvan for follow up. Doing well from cardiac standpoint without any complaints. BP was elevated in office, however reported home BP at goal. No med changes.  Continued on Amlodipine  10 mg daily, Lopressor  12.5 mg BID, Pravastatin  80 mg daily, Zeta 10 mg daily.  Follow  up Carotid US  07/2023 as above. Follow up ECHO 07/2023 as above with concern for HOCM with severe LVH. Follow up Cardiac MRI 08/2023 as above showed moderate LVH and not consistent with HOCM.   Recent hospitalization   Today, reports ### and denies ###.  Denies chest pain, shortness of breath, palpitations, syncope, presyncope, dizziness, orthopnea, PND, swelling or significant weight changes, acute bleeding, or claudication.   Reports compliance with medications.  Dietary habitats:  Activity level:  Social: Denies tobacco use/alcohol/drug use  Denies any recent hospitalizations or visits to the emergency department.   Essential hypertension Reports well controlled Home BP:  BP this OV well controlled today:  Continue on Amlodipine  10 mg daily, Lopressor  12.5 mg BID Encourage physical activity for 150 minutes per week and heart healthy low sodium diet. Discussed limiting sodium intake to < 2 grams daily.     LVH (left ventricular hypertrophy) Most recent ECHO 07/2023 as above with concern for HOCM with severe LVH Cardiac MRI 08/2023 as above showed moderate LVH and not consistent with HOCM.  Most likely secondary to HTN.   HLD, LDL goal < 100 Previously discontinued Lipitor due to rash.  LDL at goal as below. Continue Pravastatin  80 mg daily, Zeta 10 mg daily.  Lipid Panel     Component Value Date/Time   CHOL 157 03/23/2024 0915   TRIG 49 03/23/2024 0915   HDL 65 03/23/2024 0915   CHOLHDL 2.4 03/23/2024 0915   CHOLHDL 3.5 06/29/2022 1425   VLDL 37 06/29/2022 1425   LDLCALC 82 03/23/2024 0915  LDLCALC 144 (H) 05/15/2019 0836   LABVLDL 10 03/23/2024 0915     Aortic atherosclerosis Noted on CT scan  Noted on [CT chest/abdomen/echo] dated [mm/dd/yyyy]. No evidence of aneurysm or dissection. Aortic diameter [__] cm Patient currently asymptomatic / denies claudication, abdominal pain, back pain, or embolic symptoms. Risk factors present: HTN, HLD, Diabetes / Prediabetes, Obesity,  Tobacco history, Family history of premature CAD, Sedentary lifestyle Other Relevant Conditions: Coronary artery disease, Peripheral artery disease, Carotid disease Discussed the importance of managing risk factors as above. Emphasized importance of medication adherence, BP/lipid control, and lifestyle changes.   H/O carotid stenosis Carotid Stenosis  Previous Carotid US  showed evidence of stenosis as above.  Carotid US  07/2023: no evidence of significant stenosis No bruits on exam. Denies presyncope/syncope.  Continue to monitor.  Will plan to repeat Carotid US  if bruit present or develops associated symptoms.   History of palpitations SVT (supraventricular tachycardia) NSVT (nonsustained ventricular tachycardia) (HCC) Zio 2021: mainly NSR with Avg HR 85, range HR 85-193, SVT up to 10 beats, NSVT up to 7 beats Denies any palpations Continue Lopressor  as above.    ROS: 10 point review of system has been reviewed and considered negative except ones been listed in the HPI.   Studies Reviewed: SABRA   ECHO IMPRESSIONS 07/14/2023  1. Intracavitary gradient is 12 mm Hg with rest that increased to 50 mm  Hg with Valsalva. Left ventricular ejection fraction, by estimation, is 60  to 65%. The left ventricle has normal function. The left ventricle has no  regional wall motion  abnormalities. There is severe concentric left ventricular hypertrophy.  Left ventricular diastolic parameters are consistent with Grade I  diastolic dysfunction (impaired relaxation). Elevated left ventricular  end-diastolic pressure.   2. Right ventricular systolic function is normal. The right ventricular  size is normal. There is normal pulmonary artery systolic pressure.   3. The mitral valve is normal in structure. No evidence of mitral valve  regurgitation. No evidence of mitral stenosis.   4. The aortic valve is tricuspid. Aortic valve regurgitation is not  visualized. No aortic stenosis is present.   5. The  inferior vena cava is normal in size with greater than 50%  respiratory variability, suggesting right atrial pressure of 3 mmHg.   Comparison(s): Prior images unable to be directly viewed, comparison made  by report only. LVH worsened from moderate to severe with thickness 1.5  cm. New dynamic intracavitary gradient is noted.   Cardiac MRI 09/02/2023 IMPRESSION: 1. Moderate LV hypertrophy measuring up to 14mm in basal septum, not meeting criteria for hypertrophic cardiomyopathy (<30mm) 2.  Normal LV size and systolic function (EF 59%) 3.  Normal RV size and systolic function (EF 52%) 4.  No late gadolinium enhancement to suggest myocardial scar Risk Assessment/Calculations:   {Does this patient have ATRIAL FIBRILLATION?:[phone]} No BP recorded.  {Refresh Note OR Click here to enter BP  :1}***       Physical Exam:   VS:  There were no vitals taken for this visit.   Wt Readings from Last 3 Encounters:  04/20/24 133 lb 1.3 oz (60.4 kg)  03/15/24 134 lb (60.8 kg)  11/09/23 140 lb (63.5 kg)    GEN: Well nourished, well developed in no acute distress while sitting in chair.  NECK: No JVD; No carotid bruits CARDIAC: ***RRR, no murmurs, rubs, gallops RESPIRATORY:  Clear to auscultation without rales, wheezing or rhonchi  ABDOMEN: Soft, non-tender, non-distended EXTREMITIES:  No edema; No deformity  ASSESSMENT AND PLAN: .   ***    {Are you ordering a CV Procedure (e.g. stress test, cath, DCCV, TEE, etc)?   Press F2        :[id number]}  Dispo: ***  Signed, Lorette CINDERELLA Kapur, PA-C

## 2024-04-24 ENCOUNTER — Ambulatory Visit: Admitting: Physician Assistant

## 2024-05-14 ENCOUNTER — Other Ambulatory Visit: Payer: Self-pay

## 2024-05-14 DIAGNOSIS — E782 Mixed hyperlipidemia: Secondary | ICD-10-CM

## 2024-05-29 NOTE — Progress Notes (Unsigned)
 " Cardiology Office Note:  .   Date:  05/30/2024  ID:  Kyle Rivers, DOB [birth year], MRN [id number] PCP: Bevely Doffing, FNP  [facility] Providers Cardiologist:  Alvan Carrier, MD {  History of Present Illness: .   Kyle Rivers is a 69 y.o. male  with PMHx of SVT/NSVT, LVH, Aortic Atherosclerosis, Carotid Stenosis, Right distal common iliac artery aneurysm, HTN, HLD who reports to [place] office for 6 month follow up.   Pertinent cardiac medical history:  HTN diagnosed since 69 years old LVH ECHO 03/2018: EF 60-65%, moderate LVH, G1DD, mild MV/TV regurgitation  ECHO 07/2023: EF 60-65%, Intracavitary gradient of 12 mmHg at rest and increased to 50 mmHg with Valsalva, severe LVH, G1DD, elevated LVEDP  Cardiac MRI 08/2023: moderate LVH with up to 14 mm in basal septum, not consistent with HOCM or myocardial scar.  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 Last seen in 06/2022 with f/u planned in 2026.  Relevant Cardiac Studies  Exercise Myoview  2019: normal, no evidence of ischemia or scar    Last seen in heartcare 06/2023 by Dr. Alvan for follow up. Doing well from cardiac standpoint without any complaints. BP was elevated in office, however reported home BP at goal. No med changes.  Continued on Amlodipine  10 mg daily, Lopressor  12.5 mg BID, Pravastatin  80 mg daily, Zeta 10 mg daily.  Follow up Carotid US  07/2023 as above. Follow up ECHO 07/2023  as above with concern for HOCM with severe LVH. Follow up Cardiac MRI 08/2023 as above showed moderate LVH and not consistent with HOCM.   Today, the patient reports doing well overall. He denies chest pain, shortness of breath, palpitations, syncope, dizziness, orthopnea, PND, edema, weight changes, bleeding, or claudication. He reports medication compliance, though he takes Lopressor  25 mg once daily instead of 12.5 mg twice daily with good effect. Home BP averages 125-135/75-85 mmHg. He follows a vegan diet, exercises daily with tai chi, uses herbal supplements (Moringa, Sours[employer], [employer] [place]  and Tumeric), quit smoking in 2018 after a 40-year history, denies alcohol use, and has had no recent hospitalizations or ED visits.   ROS: 10 point review of system has been reviewed and considered negative except ones been listed in the HPI.   Studies Reviewed: SABRA   ECHO IMPRESSIONS 07/14/2023  1. Intracavitary gradient is 12 mm Hg with rest that increased to 50 mm  Hg with Valsalva. Left ventricular ejection fraction, by estimation, is 60  to 65%. The left ventricle has normal function. The left ventricle has no  regional wall motion  abnormalities. There is severe concentric left ventricular hypertrophy.  Left ventricular diastolic parameters are consistent with Grade I  diastolic dysfunction (impaired relaxation). Elevated left ventricular  end-diastolic pressure.   2. Right ventricular systolic function is normal. The right ventricular  size is normal. There is normal pulmonary artery systolic pressure.   3. The mitral valve is normal in structure. No evidence of mitral valve  regurgitation. No evidence of  mitral stenosis.   4. The aortic valve is tricuspid. Aortic valve regurgitation is not  visualized. No aortic stenosis is present.   5. The inferior vena cava is normal in size with greater than 50%  respiratory variability, suggesting right atrial pressure of 3 mmHg.   Comparison(s): Prior images  unable to be directly viewed, comparison made  by report only. LVH worsened from moderate to severe with thickness 1.5  cm. New dynamic intracavitary gradient is noted.    Cardiac MRI 09/02/2023 IMPRESSION: 1. Moderate LV hypertrophy measuring up to 14mm in basal septum, not meeting criteria for hypertrophic cardiomyopathy (<50mm) 2.  Normal LV size and systolic function (EF 59%) 3.  Normal RV size and systolic function (EF 52%) 4.  No late gadolinium enhancement to suggest myocardial scar  CV Studies: Cardiac studies reviewed are outlined and summarized above. Otherwise please see EMR for full report.   Physical Exam:   VS:  BP 136/78 (BP Location: Left Arm, Cuff Size: Normal)   Pulse 86   Ht 5' 7 (1.702 m)   Wt 132 lb 6.4 oz (60.1 kg)   SpO2 98%   BMI 20.74 kg/m    Wt Readings from Last 3 Encounters:  05/30/24 132 lb 6.4 oz (60.1 kg)  04/20/24 133 lb 1.3 oz (60.4 kg)  03/15/24 134 lb (60.8 kg)    GEN: Well nourished, well developed in no acute distress while sitting in chair.  NECK: No JVD; No carotid bruits CARDIAC: RRR, no murmurs, rubs, gallops RESPIRATORY:  Clear to auscultation without rales, wheezing or rhonchi  ABDOMEN: Soft, non-tender, non-distended EXTREMITIES:  No edema; No deformity   ASSESSMENT AND PLAN: .    Essential hypertension Reports well controlled Home BP: 125-135/75-85's  BP this OV initially elevated 148/80 with repeat BP 138  Continue on Amlodipine  10 mg daily, Lopressor  12.5 mg BID Encourage physical activity for 150 minutes per week and heart healthy low sodium diet. Discussed limiting sodium intake to < 2 grams daily.      LVH (left ventricular hypertrophy) Most recent ECHO 07/2023 as above with concern for HOCM with severe LVH Cardiac MRI 08/2023 as above showed moderate LVH and not consistent with HOCM.  Most likely secondary to HTN.    HLD, LDL goal < 70 Previously discontinued Lipitor due to rash.  Recommended LDL goal < 70 due to history  of Aortic atherosclerosis and carotid stenosis.  LDL 82 in 03/2024, which is just mildly above goal. Patient would prefer to manage via lifestyle management and traditional medicine. He is not interested in switching medication or adding any additional medication. In the future, he would like to discontinue statin once cholesterol is more controlled.  He prefer to get repeat lipid panel with PCP at next office visit.  Continue Pravastatin  80 mg daily, Zeta 10 mg daily.  Lipid Panel     Component Value Date/Time   CHOL 157 03/23/2024 0915   TRIG 49 03/23/2024 0915   HDL 65 03/23/2024 0915   CHOLHDL 2.4 03/23/2024 0915   CHOLHDL 3.5 06/29/2022 1425   VLDL 37 06/29/2022 1425   LDLCALC 82 03/23/2024 0915   LDLCALC 144 (H) 05/15/2019 0836   LABVLDL 10 03/23/2024 0915   Aortic atherosclerosis Noted on CT scan in 04/2019.  Risk factors present: HTN, HLD, Previous Tobacco history Other Relevant Conditions: Carotid disease Discussed the importance of managing risk factors as above. Emphasized importance of medication adherence, BP/lipid control, and lifestyle changes.    H/O carotid stenosis  Previous Carotid US  showed evidence of stenosis as above.  Carotid US  07/2023: no evidence of significant stenosis No bruits on exam. Denies presyncope/syncope.  Continue to monitor.  Will plan to repeat Carotid US  if bruit present or develops associated symptoms.    History of palpitations SVT (supraventricular tachycardia) NSVT (nonsustained ventricular tachycardia) (HCC) Zio 2021: mainly NSR with Avg HR 85, range HR 85-193, SVT up to 10 beats, NSVT up to 7 beats Denies any palpations Continue Lopressor  as above.      Dispo: Follow up in 6 months.   Signed, Lorette CINDERELLA Kapur, PA-C  "

## 2024-05-30 ENCOUNTER — Ambulatory Visit: Attending: Physician Assistant | Admitting: Physician Assistant

## 2024-05-30 ENCOUNTER — Encounter: Payer: Self-pay | Admitting: Physician Assistant

## 2024-05-30 VITALS — BP 136/78 | HR 86 | Ht 67.0 in | Wt 132.4 lb

## 2024-05-30 DIAGNOSIS — Z87898 Personal history of other specified conditions: Secondary | ICD-10-CM

## 2024-05-30 DIAGNOSIS — I517 Cardiomegaly: Secondary | ICD-10-CM | POA: Diagnosis not present

## 2024-05-30 DIAGNOSIS — I700 Atherosclerosis of aorta: Secondary | ICD-10-CM | POA: Diagnosis not present

## 2024-05-30 DIAGNOSIS — I6529 Occlusion and stenosis of unspecified carotid artery: Secondary | ICD-10-CM

## 2024-05-30 DIAGNOSIS — I471 Supraventricular tachycardia, unspecified: Secondary | ICD-10-CM | POA: Diagnosis not present

## 2024-05-30 DIAGNOSIS — I10 Essential (primary) hypertension: Secondary | ICD-10-CM | POA: Diagnosis not present

## 2024-05-30 DIAGNOSIS — E785 Hyperlipidemia, unspecified: Secondary | ICD-10-CM

## 2024-05-30 DIAGNOSIS — I4729 Other ventricular tachycardia: Secondary | ICD-10-CM | POA: Diagnosis not present

## 2024-05-30 MED ORDER — METOPROLOL TARTRATE 25 MG PO TABS: 25.0000 mg | ORAL_TABLET | Freq: Once | ORAL | 3 refills | Status: AC

## 2024-05-30 NOTE — Patient Instructions (Signed)
 Medication Instructions:  Your physician recommends that you continue on your current medications as directed. Please refer to the Current Medication list given to you today.  *If you need a refill on your cardiac medications before your next appointment, please call your pharmacy*  Lab Work: None If you have labs (blood work) drawn today and your tests are completely normal, you will receive your results only by: MyChart Message (if you have MyChart) OR A paper copy in the mail If you have any lab test that is abnormal or we need to change your treatment, we will call you to review the results.  Testing/Procedures: None  Follow-Up: At [facility], you and your health needs are our priority.  As part of our continuing mission to provide you with exceptional heart care, our providers are all part of one team.  This team includes your primary Cardiologist (physician) and Advanced Practice Providers or APPs (Physician Assistants and Nurse Practitioners) who all work together to provide you with the care you need, when you need it.  Your next appointment:   6 month(s)  Provider:   You may see Alvan Carrier, MD or one of the following Advanced Practice Providers on your designated Care Team:   Laymon Qua, PA-C  Scotesia Mayfield, [place] Olivia Pavy, [place]     We recommend signing up for the patient portal called MyChart.  Sign up information is provided on this After Visit Summary.  MyChart is used to connect with patients for Virtual Visits (Telemedicine).  Patients are able to view lab/test results, encounter notes, upcoming appointments, etc.  Non-urgent messages can be sent to your provider as well.   To learn more about what you can do with MyChart, go to [email or web address].   Other Instructions Thank you for choosing [facility]!

## 2024-06-11 ENCOUNTER — Other Ambulatory Visit: Payer: Self-pay

## 2024-06-11 DIAGNOSIS — E782 Mixed hyperlipidemia: Secondary | ICD-10-CM

## 2024-08-02 ENCOUNTER — Ambulatory Visit

## 2024-08-02 ENCOUNTER — Ambulatory Visit (HOSPITAL_COMMUNITY)

## 2024-11-13 ENCOUNTER — Ambulatory Visit
